# Patient Record
Sex: Female | Born: 1960 | Race: White | Hispanic: No | Marital: Married | Smoking: Current every day smoker
Health system: Southern US, Community
[De-identification: ages and names within clinical notes are randomized; demographics above are authoritative.]

## PROBLEM LIST (undated history)

## (undated) DIAGNOSIS — M1990 Unspecified osteoarthritis, unspecified site: Secondary | ICD-10-CM

## (undated) DIAGNOSIS — R1900 Intra-abdominal and pelvic swelling, mass and lump, unspecified site: Secondary | ICD-10-CM

## (undated) DIAGNOSIS — Z720 Tobacco use: Secondary | ICD-10-CM

## (undated) DIAGNOSIS — Z87442 Personal history of urinary calculi: Secondary | ICD-10-CM

## (undated) DIAGNOSIS — R3129 Other microscopic hematuria: Secondary | ICD-10-CM

## (undated) HISTORY — PX: LITHOTRIPSY: SUR834

## (undated) HISTORY — DX: Tobacco use: Z72.0

---

## 1991-03-02 HISTORY — PX: OTHER SURGICAL HISTORY: SHX169

## 2002-11-28 ENCOUNTER — Other Ambulatory Visit: Admission: RE | Admit: 2002-11-28 | Discharge: 2002-11-28 | Payer: Self-pay | Admitting: Obstetrics and Gynecology

## 2004-02-20 ENCOUNTER — Ambulatory Visit: Payer: Self-pay | Admitting: Specialist

## 2004-03-01 HISTORY — PX: BUNIONECTOMY: SHX129

## 2004-05-08 ENCOUNTER — Ambulatory Visit: Payer: Self-pay | Admitting: Specialist

## 2006-03-01 HISTORY — PX: SKIN SURGERY: SHX2413

## 2011-10-28 ENCOUNTER — Other Ambulatory Visit: Payer: Self-pay | Admitting: Urology

## 2011-10-29 ENCOUNTER — Encounter (HOSPITAL_COMMUNITY): Payer: Self-pay | Admitting: *Deleted

## 2011-10-29 NOTE — Pre-Procedure Instructions (Signed)
Asked to bring blue folder the day of the procedure,insurance card,I.D. driver's license,wear comfortable clothing and have a driver for the day. Asked not to take Advil,Motrin,Ibuprofen,Aleve or any NSAIDS, Aspirin, or Toradol for 72 hours prior to procedure,  No vitamins or herbal medications 7 days prior to procedure. Instructed to take laxative per doctor's office instructions and eat a light dinner the evening before procedure.   To arrive at        for procedure.

## 2011-11-29 ENCOUNTER — Encounter (HOSPITAL_COMMUNITY): Payer: Self-pay | Admitting: *Deleted

## 2011-11-29 NOTE — Pre-Procedure Instructions (Signed)
Asked to bring blue folder the day of the procedure,insurance card,I.D. driver's license,wear comfortable clothing and have a driver for the day. Asked not to take Advil,Motrin,Ibuprofen,Aleve or any NSAIDS, Aspirin, or Toradol for 72 hours prior to procedure,  No vitamins or herbal medications 7 days prior to procedure. Instructed to take laxative per doctor's office instructions and eat a light dinner the evening before procedure.   To arrive at 1030 for lithotripsy procedure.  

## 2011-12-02 ENCOUNTER — Encounter (HOSPITAL_COMMUNITY)
Admission: RE | Disposition: A | Discharge status: Home / Self Care | Payer: Self-pay | Source: Ambulatory Visit | Attending: Urology

## 2011-12-02 ENCOUNTER — Ambulatory Visit (HOSPITAL_COMMUNITY)
Admission: RE | Admit: 2011-12-02 | Discharge: 2011-12-02 | Disposition: A | Discharge status: Home / Self Care | Payer: BC Managed Care – PPO | Source: Ambulatory Visit | Attending: Urology | Admitting: Urology

## 2011-12-02 ENCOUNTER — Encounter (HOSPITAL_COMMUNITY): Payer: Self-pay | Admitting: *Deleted

## 2011-12-02 ENCOUNTER — Ambulatory Visit (HOSPITAL_COMMUNITY): Payer: BC Managed Care – PPO

## 2011-12-02 DIAGNOSIS — F17200 Nicotine dependence, unspecified, uncomplicated: Secondary | ICD-10-CM | POA: Insufficient documentation

## 2011-12-02 DIAGNOSIS — N200 Calculus of kidney: Secondary | ICD-10-CM | POA: Insufficient documentation

## 2011-12-02 SURGERY — LITHOTRIPSY, ESWL
Anesthesia: LOCAL | Laterality: Right

## 2011-12-02 MED ORDER — DIAZEPAM 5 MG PO TABS
10.0000 mg | ORAL_TABLET | ORAL | Status: AC
  Administered 2011-12-02: 10 mg via ORAL

## 2011-12-02 MED ORDER — CIPROFLOXACIN HCL 500 MG PO TABS
ORAL_TABLET | ORAL | Status: AC
  Administered 2011-12-02: 500 mg via ORAL
  Filled 2011-12-02: qty 1

## 2011-12-02 MED ORDER — DEXTROSE-NACL 5-0.45 % IV SOLN
INTRAVENOUS | Status: DC
  Administered 2011-12-02: 11:00:00 via INTRAVENOUS

## 2011-12-02 MED ORDER — DIPHENHYDRAMINE HCL 25 MG PO CAPS
25.0000 mg | ORAL_CAPSULE | ORAL | Status: AC
  Administered 2011-12-02: 25 mg via ORAL

## 2011-12-02 MED ORDER — DIAZEPAM 5 MG PO TABS
ORAL_TABLET | ORAL | Status: AC
  Administered 2011-12-02: 10 mg via ORAL
  Filled 2011-12-02: qty 2

## 2011-12-02 MED ORDER — CIPROFLOXACIN HCL 500 MG PO TABS
500.0000 mg | ORAL_TABLET | ORAL | Status: AC
  Administered 2011-12-02: 500 mg via ORAL

## 2011-12-02 MED ORDER — DIPHENHYDRAMINE HCL 25 MG PO CAPS
ORAL_CAPSULE | ORAL | Status: AC
  Administered 2011-12-02: 25 mg via ORAL
  Filled 2011-12-02: qty 1

## 2011-12-02 NOTE — H&P (Signed)
History and Physical  Chief Complaint:Right flank pain  History of Present Illness: Sharon Wright was seen for gross hematuria.  Work-up included a CT scan that showed a 10 x 6 mm right renal pelvis calculus with mild hydronephrosis.  She is scheduled for ESL.  Past Medical History  Diagnosis Date  . Chronic kidney disease     kidney stones   Past Surgical History  Procedure Date  . Back syugery 1993  . Benign tumor 1993    Medications: Ibuprofen Allergies:  Allergies  Allergen Reactions  . Penicillins     History reviewed. No pertinent family history. Social History:  reports that she has been smoking Cigarettes.  She has a 35 pack-year smoking history. She has never used smokeless tobacco. She reports that she does not drink alcohol or use illicit drugs.  ROS: All systems are reviewed and negative except as noted.   Physical Exam:  Vital signs in last 24 hours: Temp:  [97.4 F (36.3 C)] 97.4 F (36.3 C) (10/03 1035) Pulse Rate:  [76] 76  (10/03 1035) Resp:  [20] 20  (10/03 1035) BP: (129)/(65) 129/65 mmHg (10/03 1035) SpO2:  [100 %] 100 % (10/03 1035) Weight:  [218 lb 4 oz (98.998 kg)] 218 lb 4 oz (98.998 kg) (10/03 1035)  Cardiovascular: Skin warm; not flushed Respiratory: Breaths quiet; no shortness of breath Abdomen: No masses Neurological: Normal sensation to touch Musculoskeletal: Normal motor function arms and legs Lymphatics: No inguinal adenopathy Skin: No rashes Genitourinary:Normal female genitalia     Impression/Assessment:  Right renal pelvis calculus  Plan:  ESL right renal pelvis calculus.  Esperanza Madrazo-HENRY 12/02/2011, 12:42 PM

## 2011-12-02 NOTE — Progress Notes (Signed)
Pt spoke to RN about her daughter just getting out of rehab for prescription drug rehab. Pt does not want daughter to see Rx for narcotics or even knowing that pt has prescription drugs. Called Dr Brunilda Payor and informed him of this. Rx for vicodin to be escripted to [facility] in [place].

## 2011-12-02 NOTE — Op Note (Signed)
Refer to [facility] Op Note scanned in the chart 

## 2011-12-03 ENCOUNTER — Encounter (HOSPITAL_COMMUNITY): Payer: Self-pay

## 2013-01-31 ENCOUNTER — Encounter (HOSPITAL_COMMUNITY): Payer: Self-pay

## 2013-01-31 NOTE — Progress Notes (Signed)
Spoke with pt via phone in regards to lithotripsy procedure.  Reminded her to bring blue folder with paperwork completed.  Reminded pt to bring ID and insurance card.  Informed pt to stop aspirin/advil 72 hours before litho.  Informed pt about laxative before litho also.  No vitamins/herbal meds 7 days before procedure also.  Pt had litho over a year ago and states she is familiar with what to expect.  Informed her where to come also.  Pt stopped several vitamins and supplements about 10 days ago and she is aware to stay off of them until after the procedure.  Pt voiced understanding to teaching and information.

## 2013-02-01 ENCOUNTER — Other Ambulatory Visit: Payer: Self-pay | Admitting: Urology

## 2013-02-05 ENCOUNTER — Encounter (HOSPITAL_COMMUNITY)
Admission: RE | Disposition: A | Discharge status: Home / Self Care | Payer: Self-pay | Source: Ambulatory Visit | Attending: Urology

## 2013-02-05 ENCOUNTER — Encounter (HOSPITAL_COMMUNITY): Payer: Self-pay

## 2013-02-05 ENCOUNTER — Ambulatory Visit (HOSPITAL_COMMUNITY): Payer: BC Managed Care – PPO

## 2013-02-05 ENCOUNTER — Ambulatory Visit (HOSPITAL_COMMUNITY)
Admission: RE | Admit: 2013-02-05 | Discharge: 2013-02-05 | Disposition: A | Discharge status: Home / Self Care | Payer: BC Managed Care – PPO | Source: Ambulatory Visit | Attending: Urology | Admitting: Urology

## 2013-02-05 DIAGNOSIS — F17200 Nicotine dependence, unspecified, uncomplicated: Secondary | ICD-10-CM | POA: Insufficient documentation

## 2013-02-05 DIAGNOSIS — N200 Calculus of kidney: Secondary | ICD-10-CM | POA: Insufficient documentation

## 2013-02-05 SURGERY — LITHOTRIPSY, ESWL
Anesthesia: LOCAL | Laterality: Left

## 2013-02-05 MED ORDER — DIPHENHYDRAMINE HCL 25 MG PO CAPS
25.0000 mg | ORAL_CAPSULE | ORAL | Status: AC
  Administered 2013-02-05: 25 mg via ORAL
  Filled 2013-02-05: qty 1

## 2013-02-05 MED ORDER — DEXTROSE-NACL 5-0.45 % IV SOLN
INTRAVENOUS | Status: DC
  Administered 2013-02-05: 16:00:00 via INTRAVENOUS

## 2013-02-05 MED ORDER — DIAZEPAM 5 MG PO TABS
10.0000 mg | ORAL_TABLET | ORAL | Status: AC
  Administered 2013-02-05: 10 mg via ORAL
  Filled 2013-02-05: qty 2

## 2013-02-05 MED ORDER — CIPROFLOXACIN HCL 500 MG PO TABS
500.0000 mg | ORAL_TABLET | ORAL | Status: AC
  Administered 2013-02-05: 500 mg via ORAL
  Filled 2013-02-05: qty 1

## 2013-02-05 NOTE — Progress Notes (Signed)
Pt. C/o prescription that was dispensed in office, makes her nauseated, Dr. Brunilda Payor notified, instructed to reinforce teaching of eating something when taking medication, do not take on an empty stomach, teach back given to RN per patient and pt's husband, pt. To call office tomorrow, in nausea continues when taking medication.

## 2013-02-05 NOTE — Op Note (Signed)
Refer to [facility] Op Note scanned in the chart 

## 2013-02-05 NOTE — H&P (Signed)
History of Present Illness Sharon Wright is a patient of Dr. Madilyn Hook w/ GU hx of nephrolithiasis. She had a right ESWL on 11/2011 for a 6x61mm right renal pelvis stone and f/u KUB in Jan 2014 showed no residual stones.    Interval hx:  Sharon Wright returns today w/ c/o 1 wk hx of left flank pain that started after taking a long walk, 10/10 that fluctuates w/ 5/10 in pain severity. She was seen at local ER and found to have a non-obstructing 9mm left renal pelvis stone.  nothing makes the pain better or worse. ibuprofen provides minimal relief.  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 no fever/chills/nause/vomitting but c/o bad headache and nausea. Currently pain is described as soreness. She has chronic back pain so is uncertain is pain is worsened by back pain or stone. She does not like to take pain meds because she has high tolerance for pain and worried of tolerance. She does c/o constipation.  Denies fever/chills/vomiting.  Denies any voiding symptoms. Denies hematuria or associated signs/symptoms or other modifying factors.   Past Medical History Problems  1. History of Abdominal pain, suprapubic (789.09) 2. 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 History of No Medical Problems  Surgical History Problems  1. History of Back Surgery 2. 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 History of Lithotripsy  Current Meds 1. Ibuprofen TABS;  Therapy: (Recorded:12Jul2013) to Recorded 2. Vitamin B Complex Oral Tablet;  Therapy: (Recorded:24Nov2014) to Recorded 3. Vitamin D3 CAPS;  Therapy: (Recorded:24Nov2014) to Recorded  Allergies Medication  1. Penicillins  Family History Problems  1. Family history of Family Health Status - Mother's Age : Mother   64 yo 2. Family history of Heart Disease (V17.49) : Mother 3. Family history of Heart Disease (V17.49) 4. Family history of Hematuria : Mother 5.  Family history of Hypertension (V17.49) : Mother 6. Family history of Hypertension (V17.49) 7. Family history of Type 2 Diabetes Mellitus 8. Family history of Urinary Calculus : Father 41. Family history of Urinary Calculus 10. Family history of Uterine Cancer (V16.49) : Mother  Social History Problems  1. Denied: History of Alcohol Use 2. Caffeine Use   3/day 3. Marital History - Currently Married 4. Occupation:   Designer, fashion/clothing 5. Smoker, current status unknown (305.1) 6. Tobacco Use (V15.82)   Smokes 1 pack for >30 yrs  Review of Systems Genitourinary, constitutional, skin, eye, otolaryngeal, hematologic/lymphatic, cardiovascular, pulmonary, endocrine, musculoskeletal, gastrointestinal, neurological and psychiatric system(s) were reviewed and pertinent findings if present are noted.  Genitourinary: no urinary frequency, no urinary urgency and no hematuria.  Gastrointestinal: nausea and flank pain, but no vomiting, no abdominal pain, no diarrhea and no constipation.  Constitutional: no fever.    Vitals Vital Signs [Data Includes: Last 1 Day]  Recorded: 24Nov2014 02:43PM  Blood Pressure: 113 / 77 Temperature: 98 F Heart Rate: 65  Physical Exam Constitutional: Well nourished and well developed . No acute distress. HEENT: Within normal limits Lungs: clear  Cardiovascular:. The arterial pulses are normal.  Abdomen: The abdomen is soft and nontender. No right CVA tenderness and left CVA tenderness. Genitalia: Normal female genitalia Extremities: Within normal limits Skin: Warm and dry.  No rash.    Results/Data Urine [Data Includes: Last 1 Day]   24Nov2014 COLOR YELLOW  APPEARANCE CLEAR  SPECIFIC GRAVITY 1.030  pH 6.0  GLUCOSE NEG mg/dL BILIRUBIN NEG  KETONE TRACE  mg/dL BLOOD TRACE  PROTEIN NEG mg/dL UROBILINOGEN 0.2 mg/dL NITRITE NEG  LEUKOCYTE ESTERASE NEG  SQUAMOUS EPITHELIAL/HPF FEW  WBC 0-2 WBC/hpf RBC 3-6 RBC/hpf BACTERIA RARE  CRYSTALS Calcium Oxalate  crystals noted  CASTS NONE SEEN   The following images/tracing/specimen were independently visualized:  Renal US of left kidney: 11x5x1.9x6.13 cm w/ 7.26mm stone in renal pelvis and simple cyst of 1.58x0.94x1.46 cm., no hydronephrosis. KUB + 7-8 mm left renal pelvis stone.  The following clinical lab reports were reviewed:  UA + 3-6 RBC but no WBC or bacteria.    Assessment Assessed  1. Nephrolithiasis (592.0) 2. Left flank pain (789.09) 3. Constipation (564.00)  Pt is symptomatic for left flank pain w/ KUB + 7-1mm left renal pelvis stone and UA + 3-6 RBC but no hydronephrosis   Plan  Constipation  1. Start: Docusate Sodium 100 MG Oral Capsule; TAKE 1 CAPSULE TWICE DAILY AS  NEEDED Health Maintenance  2. UA With REFLEX; Status:Complete;   Done: 24Nov2014 02:33PM Left flank pain, Nephrolithiasis  3. Start: Xartemis XR 7.5-325 MG Oral Tablet Extended Release; Take 1 tablet twice daily 4. Follow-up Office  Follow-upneeds to be scheduled for ESWL  Status: Hold For -  Appointment,Date of Service  Requested for: 24Nov2014  Discussion/Summary  - 1  Discussed left ESWL for stone retrieval since pt is symptomatic for  pain.  pain and stone will not be able to pass w/ MET due to size and location.. 1  Patient is familiar w/ procedure and would like to proceed. Surgery order for ESWL given to scheduler. Dr. Brunilda Payor was  informed. informed.    - Xarmetis oxycodone Rx given to patient for pain since patient aware that she should not be taking any blood thinners : ASA or ibuprofen 7 days prior to ESWL. Colace also given to prevent worsening of constipation while on pain med.1

## 2013-02-09 ENCOUNTER — Other Ambulatory Visit: Payer: Self-pay | Admitting: Urology

## 2013-02-09 SURGERY — Surgical Case
Anesthesia: *Unknown

## 2015-06-19 ENCOUNTER — Emergency Department (HOSPITAL_COMMUNITY)
Admission: EM | Admit: 2015-06-19 | Discharge: 2015-06-20 | Disposition: A | Discharge status: Home / Self Care | Payer: PRIVATE HEALTH INSURANCE | Attending: Emergency Medicine | Admitting: Emergency Medicine

## 2015-06-19 ENCOUNTER — Emergency Department (HOSPITAL_COMMUNITY): Payer: PRIVATE HEALTH INSURANCE

## 2015-06-19 ENCOUNTER — Encounter (HOSPITAL_COMMUNITY): Payer: Self-pay

## 2015-06-19 DIAGNOSIS — R079 Chest pain, unspecified: Secondary | ICD-10-CM

## 2015-06-19 DIAGNOSIS — R0602 Shortness of breath: Secondary | ICD-10-CM | POA: Insufficient documentation

## 2015-06-19 DIAGNOSIS — N189 Chronic kidney disease, unspecified: Secondary | ICD-10-CM | POA: Insufficient documentation

## 2015-06-19 DIAGNOSIS — R61 Generalized hyperhidrosis: Secondary | ICD-10-CM | POA: Insufficient documentation

## 2015-06-19 DIAGNOSIS — Z880 Allergy status to penicillin: Secondary | ICD-10-CM | POA: Insufficient documentation

## 2015-06-19 DIAGNOSIS — M542 Cervicalgia: Secondary | ICD-10-CM | POA: Insufficient documentation

## 2015-06-19 DIAGNOSIS — Z8719 Personal history of other diseases of the digestive system: Secondary | ICD-10-CM | POA: Insufficient documentation

## 2015-06-19 DIAGNOSIS — F17210 Nicotine dependence, cigarettes, uncomplicated: Secondary | ICD-10-CM | POA: Diagnosis not present

## 2015-06-19 LAB — BASIC METABOLIC PANEL
ANION GAP: 9 (ref 5–15)
BUN: 21 mg/dL — ABNORMAL HIGH (ref 6–20)
CO2: 26 mmol/L (ref 22–32)
Calcium: 10 mg/dL (ref 8.9–10.3)
Chloride: 106 mmol/L (ref 101–111)
Creatinine, Ser: 0.68 mg/dL (ref 0.44–1.00)
GFR calc Af Amer: >60 mL/min (ref >60)
GLUCOSE: 99 mg/dL (ref 65–99)
POTASSIUM: 3.9 mmol/L (ref 3.5–5.1)
Sodium: 141 mmol/L (ref 135–145)

## 2015-06-19 LAB — CBC
HEMATOCRIT: 44.4 % (ref 36.0–46.0)
HEMOGLOBIN: 15.3 g/dL — AB (ref 12.0–15.0)
MCH: 30.5 pg (ref 26.0–34.0)
MCHC: 34.5 g/dL (ref 30.0–36.0)
MCV: 88.6 fL (ref 78.0–100.0)
Platelets: 231 10*3/uL (ref 150–400)
RBC: 5.01 MIL/uL (ref 3.87–5.11)
RDW: 13.7 % (ref 11.5–15.5)
WBC: 9.3 10*3/uL (ref 4.0–10.5)

## 2015-06-19 LAB — I-STAT TROPONIN, ED: Troponin i, poc: 0 ng/mL (ref 0.00–0.08)

## 2015-06-19 MED ORDER — IOPAMIDOL (ISOVUE-370) INJECTION 76%
INTRAVENOUS | Status: AC
  Administered 2015-06-19: 100 mL
  Filled 2015-06-19: qty 100

## 2015-06-19 NOTE — ED Provider Notes (Signed)
CSN: [id number]     Arrival date & time 06/19/15  1809 History   First MD Initiated Contact with Patient 06/19/15 2056     Chief Complaint  Patient presents with  . Chest Pain     HPI   Sharon Wright is an 55 y.o. female with history of gallstones and CKD who presents to the ED for evaluation of chest pain. She states she has had sharp, centralized, severe chest pain for the past four to five days. She states the episodes last for 5-10 minutes and resolve on their own. She states that the pain is severe and debilitating with associated difficulty breathing during the episodes due to the degree of pain. She reports associated diaphoresis. She has not tried anything to alleviate the episodes. The last episode was earlier today. They are random and and not associated with rest or exertion. She states that today's episode of pain radiated up her neck bilaterally. She states the pain was a burning pain in her neck. Denies feeling faint or lightheaded. Denies calf pain or LE edema. She states nothing like this has ever happened before. She does not have a personal history of cardiac dz but endorses significant fam hx of cardiac issues. Admits to smoking 1/2 - 1 PPD for decades.  Past Medical History  Diagnosis Date  . Chronic kidney disease     kidney stones   Past Surgical History  Procedure Laterality Date  . Back syugery  1993  . Benign tumor  1993   No family history on file. Social History  Substance Use Topics  . Smoking status: Current Every Day Smoker -- 0.50 packs/day for 35 years    Types: Cigarettes  . Smokeless tobacco: Never Used  . Alcohol Use: No   OB History    No data available     Review of Systems  All other systems reviewed and are negative.     Allergies  Oxycodone and Penicillins  Home Medications   Prior to Admission medications   Medication Sig Start Date End Date Taking? Authorizing Provider  acetaminophen (TYLENOL) 500 MG tablet Take 500 mg by mouth  every 6 (six) hours as needed for moderate pain.   Yes Historical Provider, MD  cholecalciferol (VITAMIN D) 1000 UNITS tablet Take 1,000 Units by mouth daily. Pt stopped med last week   Yes Historical Provider, MD  ibuprofen (ADVIL,MOTRIN) 200 MG tablet Take 200 mg by mouth every 6 (six) hours as needed. For headaches. Works 3rd shift   Yes Historical Provider, MD   BP 129/72 mmHg  Pulse 84  Temp(Src) 97.9 F (36.6 C) (Oral)  Resp 16  SpO2 94% Physical Exam  Constitutional: She is oriented to person, place, and time.  HENT:  Right Ear: External ear normal.  Left Ear: External ear normal.  Nose: Nose normal.  Mouth/Throat: Oropharynx is clear and moist. No oropharyngeal exudate.  Eyes: Conjunctivae and EOM are normal. Pupils are equal, round, and reactive to light.  Neck: Normal range of motion. Neck supple.  Cardiovascular: Normal rate, regular rhythm, normal heart sounds and intact distal pulses.   Pulmonary/Chest: Effort normal and breath sounds normal. No respiratory distress. She has no wheezes. She exhibits no tenderness.  Abdominal: Soft. Bowel sounds are normal. She exhibits no distension. There is no tenderness. There is no rebound and no guarding.  Musculoskeletal: She exhibits no edema.  Neurological: She is alert and oriented to person, place, and time. No cranial nerve deficit.  Skin: Skin is  warm and dry.  Psychiatric: She has a normal mood and affect.  Nursing note and vitals reviewed.   ED Course  Procedures (including critical care time) Labs Review Labs Reviewed  BASIC METABOLIC PANEL - Abnormal; Notable for the following:    BUN 21 (*)    All other components within normal limits  CBC - Abnormal; Notable for the following:    Hemoglobin 15.3 (*)    All other components within normal limits  I-STAT TROPOININ, ED    Imaging Review Dg Chest 2 View  06/19/2015  CLINICAL DATA:  Sharp chest pain for several days radiating into the neck EXAM: CHEST  2 VIEW  COMPARISON:  None. FINDINGS: The heart size and mediastinal contours are within normal limits. Both lungs are clear. The visualized skeletal structures are unremarkable. IMPRESSION: No active cardiopulmonary disease. Electronically Signed   By: Inez Catalina M.D.   On: 06/19/2015 18:40   Ct Angio Chest Aorta W/cm &/or Wo/cm  06/19/2015  CLINICAL DATA:  Chest pain over the last few days, worsening. Shortness of Breath. EXAM: CT ANGIOGRAPHY CHEST WITH CONTRAST TECHNIQUE: Multidetector CT imaging of the chest was performed using the standard protocol during bolus administration of intravenous contrast. Multiplanar CT image reconstructions and MIPs were obtained to evaluate the vascular anatomy. CONTRAST:  100 cc Isovue 370 COMPARISON:  06/19/2015 FINDINGS: The patient was scanned with her arms by her sides, reducing diagnostic sensitivity due to streak artifact. Mediastinum/Nodes: No filling defect is identified in the pulmonary arterial tree to suggest pulmonary embolus. There is adequate systemic arterial contrast to assess the thoracic aorta, and no aortic or branch vessel dissection is identified. No mediastinal hematoma. Lungs/Pleura: Is 3 mm likely calcified nodule in the right upper lobe, image 28/603. Calcified anterior right upper lobe nodule on image 46/603 compatible with granuloma. The left lung appears clear. Upper abdomen: Several small hypodense lesions are present in the lateral segment left hepatic lobe, probably cysts but technically nonspecific. The patient did have some prior small cysts in the liver on prior imaging in 2012, although not in the exact location seen today. Musculoskeletal: Unremarkable Review of the MIP images confirms the above findings. IMPRESSION: 1. No findings of pulmonary embolus or aortic dissection. 2. 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 3. Several small hypodense lesions in the lateral segment left hepatic lobe, likely cysts although technically not entirely specific.  Electronically Signed   By: Van Clines M.D.   On: 06/19/2015 23:45   I have personally reviewed and evaluated these images and lab results as part of my medical decision-making.   EKG Interpretation None       EKG not crossing over but nonacute  MDM   Final diagnoses:  Chest pain, unspecified chest pain type    Pt presenting with several days of intermittent chest pain with associated SOB, diaphoresis, and most recently with radiation to her neck. Trop is negative. EKG not crossing over but nonacute. Labs at baseline. CXR negative. CT angio chest was obtained to r/o dissection and was negative for dissection or PE. She is hemodynamically stable with no hypoxia, tachypnea, or focal exam findings. Discussed with pt there are many etiologies to chest pain including stress/anxiety, msk pain, and even GERD. Encouraged to f/u with PCP for further evaluation and management of symptoms. ER return precautions given.    Anne Ng, PA-C 06/20/15 [employer], MD 06/20/15 [phone]

## 2015-06-19 NOTE — ED Notes (Signed)
Pt reports central sharp chest pain, onset Saturday but the pain has worsened today and radiates to her neck. Pt reports she has been sweating a lot since the pain started and SOB on Saturday but not today.

## 2015-06-20 NOTE — Discharge Instructions (Signed)
You were seen in the emergency room today for evaluation of chest pain. Your labs including chest x-ray and CT scan were unremarkable. Your pain could be due to stress/anxiety or even heart burn or musculoskeletal pain. Return to the emergency room for worsening condition or new concerning symptoms. Follow up with your regular doctor. If you don't have a regular doctor use one of the numbers below to establish a primary care doctor.   Emergency Department Resource Guide 1) Find a Doctor and Pay Out of Pocket Although you won't have to find out who is covered by your insurance plan, it is a good idea to ask around and get recommendations. You will then need to call the office and see if the doctor you have chosen will accept you as a new patient and what types of options they offer for patients who are self-pay. Some doctors offer discounts or will set up payment plans for their patients who do not have insurance, but you will need to ask so you aren't surprised when you get to your appointment.  2) Contact Your Local Health Department Not all health departments have doctors that can see patients for sick visits, but many do, so it is worth a call to see if yours does. If you don't know where your local health department is, you can check in your phone book. The CDC also has a tool to help you locate your state's health department, and many state websites also have listings of all of their local health departments.  3) Find a [facility] If your illness is not likely to be very severe or complicated, you may want to try a walk in clinic. These are popping up all over the country in pharmacies, drugstores, and shopping centers. They're usually staffed by nurse practitioners or physician assistants that have been trained to treat common illnesses and complaints. They're usually fairly quick and inexpensive. However, if you have serious medical issues or chronic medical problems, these are probably not  your best option.  No Primary Care Doctor: - Call Health Connect at  [phone] - they can help you locate a primary care doctor that  accepts your insurance, provides certain services, etc. - Physician Referral [phone]  Emergency Department Resource Guide 1) Find a Doctor and Pay Out of Pocket Although you won't have to find out who is covered by your insurance plan, it is a good idea to ask around and get recommendations. You will then need to call the office and see if the doctor you have chosen will accept you as a new patient and what types of options they offer for patients who are self-pay. Some doctors offer discounts or will set up payment plans for their patients who do not have insurance, but you will need to ask so you aren't surprised when you get to your appointment.  2) Contact Your Local Health Department Not all health departments have doctors that can see patients for sick visits, but many do, so it is worth a call to see if yours does. If you don't know where your local health department is, you can check in your phone book. The CDC also has a tool to help you locate your state's health department, and many state websites also have listings of all of their local health departments.  3) Find a [facility] If your illness is not likely to be very severe or complicated, you may want to try a walk in clinic. These are popping  up all over the country in pharmacies, drugstores, and shopping centers. They're usually staffed by nurse practitioners or physician assistants that have been trained to treat common illnesses and complaints. They're usually fairly quick and inexpensive. However, if you have serious medical issues or chronic medical problems, these are probably not your best option.  No Primary Care Doctor: - Call Health Connect at  [phone] - they can help you locate a primary care doctor that  accepts your insurance, provides certain services, etc. - Physician  Referral Service- [phone]  Chronic Pain Problems: Organization         Address  Phone   Notes  [facility]  [phone] Patients need to be referred by their primary care doctor.   Medication Assistance: Organization         Address  Phone   Notes  [facility] Medication [facility] [address] [place], [place] [address] [phone] --Must be a resident of [facility] -- Must have NO insurance coverage whatsoever (no Medicaid/ Medicare, etc.) -- The pt. MUST have a primary care doctor that directs their care regularly and follows them in the community   [facility]  [phone]   [employer]  [phone]    Agencies that provide inexpensive medical care: Organization         Address  Phone   Notes  [place]  [phone]   [facility]    [phone]   [facility] [place], [place] [place] [address] [phone]   [facility] [address] [place] [phone]   [facility]anned Parenthood    [phone]   [facility]    [phone]   [place] [address], [place] Phone:  [phone], Fax:  [phone] Hours of Operation:  9 am - 6 pm, M-F.  Also accepts Medicaid/Medicare and self-pay.  [facility] for [place] [address], [address], [place] Phone: [phone], Fax: [phone]. Hours of Operation:  8:30 am - 5:30 pm, M-F.  Also accepts Medicaid and self-pay.  [facility] [place] [address] [place] Phone: [phone]   [place], [place], [place] [phone], [address]. [phone] Mondays & Thursdays: 7-9 AM.  First 15 patients are seen on a first come, first serve basis.    [place] Providers:  Organization         Address  Phone   Notes  [facility] [address],  [phone] Also accepts self-pay patients.  [facility] [id number] [place], [place]  [phone]   [place], [address], [place] [phone]   [facility] [address], [place] [phone]   Lucianne Lei [address], [place]   [phone] Only accepts [place] [facility] [place] patients after they have their name applied to their card.   Self-Pay (no insurance) in [facility]:  Organization         Address  Phone   Notes  Sickle Cell Patients, [facility] [place] [phone]   [facility] [place] [phone]   [facility] [place]  [phone]  [address] [address], [place] [phone]   [facility]/Dr. Osei-Bonsu  [address] [place] or [address], [place] [phone] Phone number for both [place] and [place] locations is the same.  [facility] [address] [place] [phone]   [facility] [address], [place] or [address] [phone]   [facility] [address] [place] [phone], phone; [phone], fax Sees patients 1st and 3rd Saturday of every month.  Must not qualify for public or private insurance (i.e. Medicaid, Medicare, [place] Health Choice, Veterans' Benefits)  Household income should be no more than 200% of the poverty level The clinic cannot treat you if you are pregnant or think you are pregnant  Sexually transmitted diseases are not treated at the clinic.

## 2015-06-20 NOTE — ED Notes (Signed)
Pt verbalized understanding of d/c instructions and has no further questions. Pt stable and NAD.  

## 2016-08-02 DIAGNOSIS — R1032 Left lower quadrant pain: Secondary | ICD-10-CM | POA: Diagnosis not present

## 2016-08-02 DIAGNOSIS — K5900 Constipation, unspecified: Secondary | ICD-10-CM | POA: Diagnosis not present

## 2016-08-02 DIAGNOSIS — Z1389 Encounter for screening for other disorder: Secondary | ICD-10-CM | POA: Diagnosis not present

## 2016-08-02 DIAGNOSIS — C679 Malignant neoplasm of bladder, unspecified: Secondary | ICD-10-CM | POA: Diagnosis not present

## 2016-10-20 DIAGNOSIS — L710 Perioral dermatitis: Secondary | ICD-10-CM | POA: Diagnosis not present

## 2016-10-20 DIAGNOSIS — Z6829 Body mass index (BMI) 29.0-29.9, adult: Secondary | ICD-10-CM | POA: Diagnosis not present

## 2016-10-20 DIAGNOSIS — M7061 Trochanteric bursitis, right hip: Secondary | ICD-10-CM | POA: Diagnosis not present

## 2016-12-21 DIAGNOSIS — Z6830 Body mass index (BMI) 30.0-30.9, adult: Secondary | ICD-10-CM | POA: Diagnosis not present

## 2016-12-21 DIAGNOSIS — M25551 Pain in right hip: Secondary | ICD-10-CM | POA: Diagnosis not present

## 2016-12-21 DIAGNOSIS — M545 Low back pain: Secondary | ICD-10-CM | POA: Diagnosis not present

## 2017-02-10 DIAGNOSIS — N3091 Cystitis, unspecified with hematuria: Secondary | ICD-10-CM | POA: Diagnosis not present

## 2017-02-10 DIAGNOSIS — Z6831 Body mass index (BMI) 31.0-31.9, adult: Secondary | ICD-10-CM | POA: Diagnosis not present

## 2017-02-16 DIAGNOSIS — R112 Nausea with vomiting, unspecified: Secondary | ICD-10-CM | POA: Diagnosis not present

## 2017-02-16 DIAGNOSIS — Z6830 Body mass index (BMI) 30.0-30.9, adult: Secondary | ICD-10-CM | POA: Diagnosis not present

## 2017-02-16 DIAGNOSIS — N390 Urinary tract infection, site not specified: Secondary | ICD-10-CM | POA: Diagnosis not present

## 2017-03-09 DIAGNOSIS — Z6830 Body mass index (BMI) 30.0-30.9, adult: Secondary | ICD-10-CM | POA: Diagnosis not present

## 2017-03-09 DIAGNOSIS — R079 Chest pain, unspecified: Secondary | ICD-10-CM | POA: Diagnosis not present

## 2017-03-09 DIAGNOSIS — M545 Low back pain: Secondary | ICD-10-CM | POA: Diagnosis not present

## 2017-03-22 ENCOUNTER — Encounter: Payer: Self-pay | Admitting: Family Medicine

## 2017-03-22 ENCOUNTER — Ambulatory Visit: Payer: BLUE CROSS/BLUE SHIELD | Admitting: Family Medicine

## 2017-03-22 VITALS — BP 124/78 | HR 90 | Temp 97.6°F | Wt 208.7 lb

## 2017-03-22 DIAGNOSIS — H68103 Unspecified obstruction of Eustachian tube, bilateral: Secondary | ICD-10-CM | POA: Diagnosis not present

## 2017-03-22 DIAGNOSIS — Z720 Tobacco use: Secondary | ICD-10-CM | POA: Insufficient documentation

## 2017-03-22 DIAGNOSIS — H9201 Otalgia, right ear: Secondary | ICD-10-CM

## 2017-03-22 HISTORY — DX: Tobacco use: Z72.0

## 2017-03-22 MED ORDER — PREDNISONE 20 MG PO TABS: 20.0000 mg | ORAL_TABLET | Freq: Every day | ORAL | 0 refills | Status: AC

## 2017-03-22 NOTE — Assessment & Plan Note (Signed)
Reviewed briefly the AVS info and told her that I am here to help if/when she is ready to quit

## 2017-03-22 NOTE — Patient Instructions (Addendum)
I do encourage you to quit smoking Call [phone] to sign up for smoking cessation classes You can call 1-800-QUIT-NOW to talk with a smoking cessation coach  Please do eat yogurt or kimchi or take a probiotic daily for the next month We want to replace the healthy germs in the gut If you notice foul, watery diarrhea in the next two months, schedule an appointment RIGHT AWAY or go to an urgent care or the emergency room if a holiday or over a weekend  Return for a physical in the next month or two at your convenience Call me if symptoms aren't gone after your prednisone  Health Risks of Smoking Smoking cigarettes is very bad for your health. Tobacco smoke has over 200 known poisons in it. It contains the poisonous gases nitrogen oxide and carbon monoxide. There are over 60 chemicals in tobacco smoke that cause cancer. Smoking is difficult to quit because a chemical in tobacco, called nicotine, causes addiction or dependence. When you smoke and inhale, nicotine is absorbed rapidly into the bloodstream through your lungs. Both inhaled and non-inhaled nicotine may be addictive. What are the risks of cigarette smoke? Cigarette smokers have an increased risk of many serious medical problems, including:  Lung cancer.  Lung disease, such as pneumonia, bronchitis, and emphysema.  Chest pain (angina) and heart attack because the heart is not getting enough oxygen.  Heart disease and peripheral blood vessel disease.  High blood pressure (hypertension).  Stroke.  Oral cancer, including cancer of the lip, mouth, or voice box.  Bladder cancer.  Pancreatic cancer.  Cervical cancer.  Pregnancy complications, including premature birth.  Stillbirths and smaller newborn babies, birth defects, and genetic damage to sperm.  Early menopause.  Lower estrogen level for women.  Infertility.  Facial wrinkles.  Blindness.  Increased risk of broken bones (fractures).  Senile  dementia.  Stomach ulcers and internal bleeding.  Delayed wound healing and increased risk of complications during surgery.  Even smoking lightly shortens your life expectancy by several years.  Because of secondhand smoke exposure, children of smokers have an increased risk of the following:  Sudden infant death syndrome (SIDS).  Respiratory infections.  Lung cancer.  Heart disease.  Ear infections.  What are the benefits of quitting? There are many health benefits of quitting smoking. Here are some of them:  Within days of quitting smoking, your risk of having a heart attack decreases, your blood flow improves, and your lung capacity improves. Blood pressure, pulse rate, and breathing patterns start returning to normal soon after quitting.  Within months, your lungs may clear up completely.  Quitting for 10 years reduces your risk of developing lung cancer and heart disease to almost that of a nonsmoker.  People who quit may see an improvement in their overall quality of life.  How do I quit smoking? Smoking is an addiction with both physical and psychological effects, and longtime habits can be hard to change. Your health care provider can recommend:  Programs and community resources, which may include group support, education, or talk therapy.  Prescription medicines to help reduce cravings.  Nicotine replacement products, such as patches, gum, and nasal sprays. Use these products only as directed. Do not replace cigarette smoking with electronic cigarettes, which are commonly called e-cigarettes. The safety of e-cigarettes is not known, and some may contain harmful chemicals.  A combination of two or more of these methods.  Where to find more information:  American Lung Association: www.lung.org  American Cancer  Society: www.cancer.org Summary  Smoking cigarettes is very bad for your health. Cigarette smokers have an increased risk of many serious medical  problems, including several cancers, heart disease, and stroke.  Smoking is an addiction with both physical and psychological effects, and longtime habits can be hard to change.  By stopping right away, you can greatly reduce the risk of medical problems for you and your family.  To help you quit smoking, your health care provider can recommend programs, community resources, prescription medicines, and nicotine replacement products such as patches, gum, and nasal sprays. This information is not intended to replace advice given to you by your health care provider. Make sure you discuss any questions you have with your health care provider. Document Released: 03/25/2004 Document Revised: 02/20/2016 Document Reviewed: 02/20/2016 Elsevier Interactive Patient Education  2017 [employer].  Steps to Quit Smoking Smoking tobacco can be bad for your health. It can also affect almost every organ in your body. Smoking puts you and people around you at risk for many serious long-lasting (chronic) diseases. Quitting smoking is hard, but it is one of the best things that you can do for your health. It is never too late to quit. What are the benefits of quitting smoking? When you quit smoking, you lower your risk for getting serious diseases and conditions. They can include:  Lung cancer or lung disease.  Heart disease.  Stroke.  Heart attack.  Not being able to have children (infertility).  Weak bones (osteoporosis) and broken bones (fractures).  If you have coughing, wheezing, and shortness of breath, those symptoms may get better when you quit. You may also get sick less often. If you are pregnant, quitting smoking can help to lower your chances of having a baby of low birth weight. What can I do to help me quit smoking? Talk with your doctor about what can help you quit smoking. Some things you can do (strategies) include:  Quitting smoking totally, instead of slowly cutting back how much you  smoke over a period of time.  Going to in-person counseling. You are more likely to quit if you go to many counseling sessions.  Using resources and support systems, such as: ? Database administrator with a Social worker. ? Phone quitlines. ? Careers information officer. ? Support groups or group counseling. ? Text messaging programs. ? Mobile phone apps or applications.  Taking medicines. Some of these medicines may have nicotine in them. If you are pregnant or breastfeeding, do not take any medicines to quit smoking unless your doctor says it is okay. Talk with your doctor about counseling or other things that can help you.  Talk with your doctor about using more than one strategy at the same time, such as taking medicines while you are also going to in-person counseling. This can help make quitting easier. What things can I do to make it easier to quit? Quitting smoking might feel very hard at first, but there is a lot that you can do to make it easier. Take these steps:  Talk to your family and friends. Ask them to support and encourage you.  Call phone quitlines, reach out to support groups, or work with a Social worker.  Ask people who smoke to not smoke around you.  Avoid places that make you want (trigger) to smoke, such as: ? Bars. ? Parties. ? Smoke-break areas at work.  Spend time with people who do not smoke.  Lower the stress in your life. Stress can make you  want to smoke. Try these things to help your stress: ? Getting regular exercise. ? Deep-breathing exercises. ? Yoga. ? Meditating. ? Doing a body scan. To do this, close your eyes, focus on one area of your body at a time from head to toe, and notice which parts of your body are tense. Try to relax the muscles in those areas.  Download or buy apps on your mobile phone or tablet that can help you stick to your quit plan. There are many free apps, such as QuitGuide from the [employer] Office manager for Disease Control and Prevention). You can  find more support from smokefree.gov and other websites.  This information is not intended to replace advice given to you by your health care provider. Make sure you discuss any questions you have with your health care provider. Document Released: 12/12/2008 Document Revised: 10/14/2015 Document Reviewed: 07/02/2014 Elsevier Interactive Patient Education  2018 [employer].

## 2017-03-22 NOTE — Progress Notes (Signed)
BP 124/78 (BP Location: Left Arm, Patient Position: Sitting, Cuff Size: Large)   Pulse 90   Temp 97.6 F (36.4 C) (Oral)   Wt 208 lb 11.2 oz (94.7 kg)   SpO2 98%   BMI 31.73 kg/m    Subjective:    Patient ID: Sharon Wright, female    DOB: 1960/08/27, 57 y.o.   MRN: [id number]  HPI: Sharon Wright is a 57 y.o. female  Chief Complaint  Patient presents with  . Ear Pain    worse at night, RT worse than left,   . Sinusitis    HPI Patient is establishing care here today; I care for her husband She has been having hearing loss, worse at night; right worse than left; goes to spend the night with her mother; couldn't even hear her mother's oxygen alarm with the right ear; thought it was her phone alarm Symptoms first started with decreased hearing Congestion for a week; can breathe pretty good now; really sore still under the angle of the right jaw; coughing and diaphragm is is still sore;  Did a teledoc thing, doxy plus nasal corticosteroid; no real help; ear throbs like a toothache; no ear drops Used OTC nyquil and dayquil; just one nyquil at night  She'll return for a physical in the next month or two She says she gets labs through her workplace (or perhaps it was her husband's workplace), and she says her numbers are pretty decent, except they told her to lose weight  Depression screen PHQ 2/9 03/22/2017  Decreased Interest 0  Down, Depressed, Hopeless 0  PHQ - 2 Score 0   Relevant past medical, surgical, family and social history reviewed Past Medical History:  Diagnosis Date  . Chronic kidney disease    kidney stones  . Tobacco abuse 03/22/2017   Past Surgical History:  Procedure Laterality Date  . back syugery  1993  . Benign tumor  1993   Family History  Problem Relation Age of Onset  . Congestive Heart Failure Mother   . Heart disease Mother   . Diabetes Father   . Heart disease Father   . Dementia Father   . Heart disease Sister    Social History     Tobacco Use  . Smoking status: Current Every Day Smoker    Packs/day: 1.00    Years: 35.00    Pack years: 35.00    Types: Cigarettes  . Smokeless tobacco: Never Used  Substance Use Topics  . Alcohol use: No  . Drug use: No   Interim medical history since last visit reviewed. Allergies and medications reviewed  Review of Systems Per HPI unless specifically indicated above     Objective:    BP 124/78 (BP Location: Left Arm, Patient Position: Sitting, Cuff Size: Large)   Pulse 90   Temp 97.6 F (36.4 C) (Oral)   Wt 208 lb 11.2 oz (94.7 kg)   SpO2 98%   BMI 31.73 kg/m   Wt Readings from Last 3 Encounters:  03/22/17 208 lb 11.2 oz (94.7 kg)  02/05/13 183 lb 8 oz (83.2 kg)  12/02/11 218 lb 4 oz (99 kg)    Physical Exam  Constitutional: She appears well-developed and well-nourished.  HENT:  Right Ear: No drainage or swelling. Tympanic membrane is not injected, not scarred, not perforated, not erythematous and not retracted. A middle ear effusion (scant, cloudy behind drum) is present.  Left Ear: No drainage or swelling. Tympanic membrane is not injected, not  scarred, not perforated, not erythematous and not retracted. A middle ear effusion (scant, cloudy behind drum) is present.  Nose: Mucosal edema and rhinorrhea present.  Mouth/Throat: Oropharynx is clear and moist and mucous membranes are normal.  Eyes: EOM are normal. No scleral icterus.  Cardiovascular: Normal rate and regular rhythm.  Pulmonary/Chest: Effort normal and breath sounds normal.  Lymphadenopathy:    She has no cervical adenopathy.  Skin: She is not diaphoretic. No pallor.  Psychiatric: She has a normal mood and affect. Her behavior is normal.      Assessment & Plan:   Problem List Items Addressed This Visit      Other   Tobacco abuse    Reviewed briefly the AVS info and told her that I am here to help if/when she is ready to quit       Other Visit Diagnoses    Acute ear pain, right    -   Primary   perhaps resolving infection, referred pain, ETD; continue ABX, start prednisone; if not better at end of prednisone, let me know, refer to ENT; no NSAIDs w/pred   Obstruction of both eustachian tubes       will treat w/prednisone x 5 days; NO other NSAIDs while on this, could cause ulcer; she agrees; let me know if not better after 5 days, refer to ENT if needed      Follow up plan: No Follow-up on file.  An after-visit summary was printed and given to the patient at [place].  Please see the patient instructions which may contain other information and recommendations beyond what is mentioned above in the assessment and plan.  Meds ordered this encounter  Medications  . predniSONE (DELTASONE) 20 MG tablet    Sig: Take 1 tablet (20 mg total) by mouth daily for 5 days. With food    Dispense:  10 tablet    Refill:  0    No orders of the defined types were placed in this encounter.

## 2017-05-23 ENCOUNTER — Encounter: Payer: BLUE CROSS/BLUE SHIELD | Admitting: Family Medicine

## 2017-07-21 ENCOUNTER — Ambulatory Visit: Payer: BLUE CROSS/BLUE SHIELD | Admitting: Nurse Practitioner

## 2017-07-27 ENCOUNTER — Ambulatory Visit: Payer: BLUE CROSS/BLUE SHIELD | Admitting: Nurse Practitioner

## 2017-08-04 ENCOUNTER — Ambulatory Visit: Payer: BLUE CROSS/BLUE SHIELD | Admitting: Nurse Practitioner

## 2017-08-16 ENCOUNTER — Emergency Department
Admission: EM | Admit: 2017-08-16 | Discharge: 2017-08-16 | Disposition: A | Discharge status: Home / Self Care | Payer: BLUE CROSS/BLUE SHIELD | Attending: Emergency Medicine | Admitting: Emergency Medicine

## 2017-08-16 ENCOUNTER — Ambulatory Visit (INDEPENDENT_AMBULATORY_CARE_PROVIDER_SITE_OTHER): Payer: BLUE CROSS/BLUE SHIELD | Admitting: Family Medicine

## 2017-08-16 ENCOUNTER — Emergency Department: Payer: BLUE CROSS/BLUE SHIELD

## 2017-08-16 ENCOUNTER — Encounter: Payer: BLUE CROSS/BLUE SHIELD | Admitting: Family Medicine

## 2017-08-16 ENCOUNTER — Encounter: Payer: Self-pay | Admitting: Family Medicine

## 2017-08-16 ENCOUNTER — Encounter: Payer: Self-pay | Admitting: Emergency Medicine

## 2017-08-16 ENCOUNTER — Other Ambulatory Visit: Payer: Self-pay

## 2017-08-16 VITALS — BP 112/76 | HR 90 | Temp 97.9°F | Resp 14 | Ht 68.0 in | Wt 199.2 lb

## 2017-08-16 DIAGNOSIS — D270 Benign neoplasm of right ovary: Secondary | ICD-10-CM | POA: Diagnosis not present

## 2017-08-16 DIAGNOSIS — E669 Obesity, unspecified: Secondary | ICD-10-CM | POA: Diagnosis not present

## 2017-08-16 DIAGNOSIS — F17210 Nicotine dependence, cigarettes, uncomplicated: Secondary | ICD-10-CM | POA: Insufficient documentation

## 2017-08-16 DIAGNOSIS — R0789 Other chest pain: Secondary | ICD-10-CM | POA: Insufficient documentation

## 2017-08-16 DIAGNOSIS — N200 Calculus of kidney: Secondary | ICD-10-CM | POA: Diagnosis not present

## 2017-08-16 DIAGNOSIS — Z79899 Other long term (current) drug therapy: Secondary | ICD-10-CM | POA: Diagnosis not present

## 2017-08-16 DIAGNOSIS — R0781 Pleurodynia: Secondary | ICD-10-CM

## 2017-08-16 DIAGNOSIS — Z23 Encounter for immunization: Secondary | ICD-10-CM | POA: Diagnosis not present

## 2017-08-16 DIAGNOSIS — Z720 Tobacco use: Secondary | ICD-10-CM

## 2017-08-16 DIAGNOSIS — Z0000 Encounter for general adult medical examination without abnormal findings: Secondary | ICD-10-CM

## 2017-08-16 DIAGNOSIS — R1012 Left upper quadrant pain: Secondary | ICD-10-CM | POA: Insufficient documentation

## 2017-08-16 DIAGNOSIS — N9489 Other specified conditions associated with female genital organs and menstrual cycle: Secondary | ICD-10-CM

## 2017-08-16 DIAGNOSIS — R109 Unspecified abdominal pain: Secondary | ICD-10-CM

## 2017-08-16 DIAGNOSIS — R079 Chest pain, unspecified: Secondary | ICD-10-CM | POA: Diagnosis not present

## 2017-08-16 DIAGNOSIS — Z1159 Encounter for screening for other viral diseases: Secondary | ICD-10-CM

## 2017-08-16 DIAGNOSIS — N839 Noninflammatory disorder of ovary, fallopian tube and broad ligament, unspecified: Secondary | ICD-10-CM | POA: Diagnosis not present

## 2017-08-16 DIAGNOSIS — R1909 Other intra-abdominal and pelvic swelling, mass and lump: Secondary | ICD-10-CM | POA: Diagnosis not present

## 2017-08-16 LAB — CBC WITH DIFFERENTIAL/PLATELET
Basophils Absolute: 0.1 10*3/uL (ref 0–0.1)
Basophils Relative: 1 %
Eosinophils Absolute: 0.2 10*3/uL (ref 0–0.7)
Eosinophils Relative: 2 %
HEMATOCRIT: 45.3 % (ref 35.0–47.0)
Hemoglobin: 15.5 g/dL (ref 12.0–16.0)
LYMPHS ABS: 3.4 10*3/uL (ref 1.0–3.6)
LYMPHS PCT: 33 %
MCH: 30.6 pg (ref 26.0–34.0)
MCHC: 34.2 g/dL (ref 32.0–36.0)
MCV: 89.5 fL (ref 80.0–100.0)
MONO ABS: 0.5 10*3/uL (ref 0.2–0.9)
MONOS PCT: 5 %
NEUTROS ABS: 6.2 10*3/uL (ref 1.4–6.5)
Neutrophils Relative %: 59 %
Platelets: 247 10*3/uL (ref 150–440)
RBC: 5.06 MIL/uL (ref 3.80–5.20)
RDW: 13.7 % (ref 11.5–14.5)
WBC: 10.3 10*3/uL (ref 3.6–11.0)

## 2017-08-16 LAB — URINALYSIS, COMPLETE (UACMP) WITH MICROSCOPIC
BACTERIA UA: NONE SEEN
Bilirubin Urine: NEGATIVE
Glucose, UA: NEGATIVE mg/dL
Ketones, ur: 5 mg/dL — AB
LEUKOCYTES UA: NEGATIVE
NITRITE: NEGATIVE
PROTEIN: NEGATIVE mg/dL
SPECIFIC GRAVITY, URINE: 1.023 (ref 1.005–1.030)
pH: 6 (ref 5.0–8.0)

## 2017-08-16 LAB — COMPREHENSIVE METABOLIC PANEL
ALK PHOS: 81 U/L (ref 38–126)
ALT: 21 U/L (ref 14–54)
ANION GAP: 10 (ref 5–15)
AST: 24 U/L (ref 15–41)
Albumin: 4.1 g/dL (ref 3.5–5.0)
BILIRUBIN TOTAL: 0.6 mg/dL (ref 0.3–1.2)
BUN: 23 mg/dL — AB (ref 6–20)
CALCIUM: 9.2 mg/dL (ref 8.9–10.3)
CO2: 23 mmol/L (ref 22–32)
Chloride: 106 mmol/L (ref 101–111)
Creatinine, Ser: 0.65 mg/dL (ref 0.44–1.00)
GFR calc Af Amer: >60 mL/min (ref >60)
Glucose, Bld: 94 mg/dL (ref 65–99)
POTASSIUM: 3.7 mmol/L (ref 3.5–5.1)
Sodium: 139 mmol/L (ref 135–145)
TOTAL PROTEIN: 6.8 g/dL (ref 6.5–8.1)

## 2017-08-16 LAB — TROPONIN I

## 2017-08-16 LAB — LIPASE, BLOOD: LIPASE: 25 U/L (ref 11–51)

## 2017-08-16 MED ORDER — IOPAMIDOL (ISOVUE-300) INJECTION 61%
75.0000 mL | Freq: Once | INTRAVENOUS | Status: AC | PRN
  Administered 2017-08-16: 75 mL via INTRAVENOUS

## 2017-08-16 MED ORDER — TETANUS-DIPHTH-ACELL PERTUSSIS 5-2.5-18.5 LF-MCG/0.5 IM SUSP
0.5000 mL | Freq: Once | INTRAMUSCULAR | Status: AC
  Administered 2017-08-16: 0.5 mL via INTRAMUSCULAR

## 2017-08-16 MED ORDER — SODIUM CHLORIDE 0.9 % IV BOLUS
1000.0000 mL | Freq: Once | INTRAVENOUS | Status: AC
  Administered 2017-08-16: 1000 mL via INTRAVENOUS

## 2017-08-16 NOTE — ED Triage Notes (Signed)
C/O pain to left breast through to back x 1 day.  C/O soreness to upper back.  States has history of kidney stones.  Symptoms also accompanied by nausea and vomiting.

## 2017-08-16 NOTE — Assessment & Plan Note (Signed)
Risk factor for heart disease; will try to help patient quit at f/u; will plan to order chest CT for lung cancer screening (unless chest CT done as part of work-up in ER today); discuss at physical in a few weeks

## 2017-08-16 NOTE — ED Provider Notes (Addendum)
[facility] Emergency Department Provider Note ____________________________________________   First MD Initiated Contact with Patient 08/16/17 [phone]     (approximate)  I have reviewed the triage vital signs and the nursing notes.   HISTORY  Chief Complaint Flank Pain  HPI Sharon Wright is a 57 y.o. female with a history of kidney stones was presented to the emergency department left upper quadrant abdominal pain which is radiating into her left chest and left back.  She denies any burning with urination or urinary frequency.  States the pain started yesterday and was worsened by movement/abduction of her left upper extremity.  She states the pain is decreased and is now a pressure-like pain that is a 3-4 out of 10.  She had mild nausea as well yesterday but is been able to tolerate p.o. fluids as of this morning.  No vomiting.  Past Medical History:  Diagnosis Date  . Chronic kidney disease    kidney stones  . Kidney stones   . Tobacco abuse 03/22/2017    Patient Active Problem List   Diagnosis Date Noted  . Obesity (BMI 30.0-34.9) 08/16/2017  . Tobacco abuse 03/22/2017    Past Surgical History:  Procedure Laterality Date  . back syugery  1993  . Benign tumor  1993  . BUNIONECTOMY Left 2006  . LITHOTRIPSY    . 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 Authorizing Provider  cholecalciferol (VITAMIN D) 1000 UNITS tablet Take 1,000 Units by mouth daily.    Yes [provider]  ibuprofen (ADVIL,MOTRIN) 200 MG tablet Take 400 mg by mouth every 6 (six) hours as needed for fever or moderate pain.    Yes [provider]  polyethylene glycol (MIRALAX / GLYCOLAX) packet Take 17 g by mouth daily as needed for mild constipation.    Yes [provider]  acetaminophen (TYLENOL) 500 MG tablet Take 500 mg by mouth every 6 (six) hours as needed  for moderate pain.    [provider]    Allergies Oxycodone and Penicillins  Family History  Problem Relation Age of Onset  . Congestive Heart Failure Mother   . Heart disease Mother   . Diabetes Father   . Heart disease Father   . Dementia Father   . Heart disease Sister     Social History Social History   Tobacco Use  . Smoking status: Current Every Day Smoker    Packs/day: 1.00    Years: 35.00    Pack years: 35.00    Types: Cigarettes  . Smokeless tobacco: Never Used  Substance Use Topics  . Alcohol use: No  . Drug use: No    Review of Systems  Constitutional: No fever/chills Eyes: No visual changes. ENT: No sore throat. Cardiovascular: As above Respiratory: No respiratory distress.  No shortness of breath. Gastrointestinal: no vomiting.  No diarrhea.  No constipation. Genitourinary: Negative for dysuria. Musculoskeletal: As above Skin: Negative for rash. Neurological: Negative for headaches, focal weakness or numbness.   ____________________________________________   PHYSICAL EXAM:  VITAL SIGNS: ED Triage Vitals  Enc Vitals Group     BP 08/16/17 0911 (!) 138/98     Pulse Rate 08/16/17 0911 69     Resp 08/16/17 0911 16     Temp 08/16/17 0911 98 F (36.7 C)     Temp Source 08/16/17 0911 Oral  SpO2 08/16/17 0911 97 %     Weight 08/16/17 0910 199 lb (90.3 kg)     Height 08/16/17 0910 5\' 8"  (1.727 m)     Head Circumference --      Peak Flow --      Pain Score 08/16/17 0910 5     Pain Loc --      Pain Edu? --      Excl. in [place]? --     Constitutional: Alert and oriented. Well appearing and in no acute distress. Eyes: Conjunctivae are normal.  Head: Atraumatic. Nose: No congestion/rhinnorhea. Mouth/Throat: Mucous membranes are moist.  Neck: No stridor.   Cardiovascular: Normal rate, regular rhythm. Grossly normal heart sounds.  Tenderness to palpation over the left lateral as well as anterior chest wall without any deformity or  crepitus. Respiratory: Normal respiratory effort.  No retractions. Lungs CTAB. Gastrointestinal: Soft with mild tenderness to the epigastrium as well as the left upper quadrant.  Negative Murphy sign.  No distention. No CVA tenderness. Musculoskeletal: No lower extremity tenderness nor edema.  No joint effusions. Neurologic:  Normal speech and language. No gross focal neurologic deficits are appreciated. Skin:  Skin is warm, dry and intact. No rash noted. Psychiatric: Mood and affect are normal. Speech and behavior are normal.  ____________________________________________   LABS (all labs ordered are listed, but only abnormal results are displayed)  Labs Reviewed  COMPREHENSIVE METABOLIC PANEL - Abnormal; Notable for the following components:      Result Value   BUN 23 (*)    All other components within normal limits  URINALYSIS, COMPLETE (UACMP) WITH MICROSCOPIC - Abnormal; Notable for the following components:   Color, Urine YELLOW (*)    APPearance CLEAR (*)    Hgb urine dipstick SMALL (*)    Ketones, ur 5 (*)    All other components within normal limits  CBC WITH DIFFERENTIAL/PLATELET  TROPONIN I  LIPASE, BLOOD   ____________________________________________  EKG  ED ECG REPORT I, Doran Stabler, the attending physician, personally viewed and interpreted this ECG.   Date: 08/16/2017  EKG Time: 0913  Rate: 62  Rhythm: normal sinus rhythm  Axis: Normal  Intervals:none  ST&T Change: No ST segment elevation or depression.  No abnormal T wave inversion. ED ECG REPORT I, Doran Stabler, the attending physician, personally viewed and interpreted this ECG.   Date: 08/16/2017  EKG Time: 0928  Rate: 66  Rhythm: normal sinus rhythm  Axis: Normal  Intervals:none  ST&T Change: No ST segment elevation or depression.  No abnormal T wave inversion.  ____________________________________________  RADIOLOGY  New right adnexal mass.  Chest x-ray without acute finding.   No acute finding on CT angiography of the chest. ____________________________________________   PROCEDURES  Procedure(s) performed:   Procedures  Critical Care performed:   ____________________________________________   INITIAL IMPRESSION / ASSESSMENT AND PLAN / ED COURSE  Pertinent labs & imaging results that were available during my care of the patient were reviewed by me and considered in my medical decision making (see chart for details).  Differential diagnosis includes, but is not limited to, ACS, aortic dissection, pulmonary embolism, cardiac tamponade, pneumothorax, pneumonia, pericarditis, myocarditis, GI-related causes including esophagitis/gastritis, and musculoskeletal chest wall pain.   Differential diagnosis includes, but is not limited to, biliary disease (biliary colic, acute cholecystitis, cholangitis, choledocholithiasis, etc), intrathoracic causes for epigastric abdominal pain including ACS, gastritis, duodenitis, pancreatitis, small bowel or large bowel obstruction, abdominal aortic aneurysm, hernia, and ulcer(s).  ----------------------------------------- 12:44 PM  on 08/16/2017 -----------------------------------------  Patient at this time without any further complaint.  New right lower quadrant mass which is likely unrelated to her pain.  My concern was that if this was was a new cancer that the patient could have had a pulmonary embolus.  However, the chest x-ray as well as the CT angiography were clear.  Patient aware of the new right adnexal mass will be following up with her OB/GYN.  Will also be given follow-up information for on-call OB/GYN because she says that she is unsure of her OB/GYN, Dr. Deatra Ina, still in practice.  Left upper quadrant/lower chest pain likely related to chest wall and abdominal wall issues.  Worsens with movement.  Very reassuring cardiac work-up after proximal May 24 hours of symptoms.  Patient to follow-up with primary care.   Understanding of the treatment plan willing to comply. ____________________________________________   FINAL CLINICAL IMPRESSION(S) / ED DIAGNOSES  Chest wall pain.  Left upper quadrant pain.  Right ovarian mass.    NEW MEDICATIONS STARTED DURING THIS VISIT:  New Prescriptions   No medications on file     Note:  This document was prepared using Dragon voice recognition software and may include unintentional dictation errors.     Orbie Pyo, MD 08/16/17 1247  Discussed with the patient that although we are not able to fully differentiate what exactly the mass is from the CAT scan that could possibly be cancer and that she needs urgent follow-up with OB/GYN to fully differentiate.  She is understanding of this and willing to comply.    Orbie Pyo, MD 08/16/17 1250

## 2017-08-16 NOTE — Progress Notes (Addendum)
Patient ID: Sharon Wright, female   DOB: [birth year], 57 y.o.   MRN: [id number]   Subjective:   Sharon Wright is a 57 y.o. female here for a complete physical exam  Patient is here for a complete physical; however, she is having left sided chest pain right now; started out deep, but sore to the touch in the back; started yesterday; prone to kidney stones, but no blood in the urine; nausea yesterday and none today; shortness of breath; she is a smoker; "I probably do have blockages"; "I like to eat and I like to smoke"; obese   USPSTF grade A and B recommendations Depression:  Depression screen [facility] 2/9 08/16/2017 03/22/2017  Decreased Interest 0 0  Down, Depressed, Hopeless 0 0  PHQ - 2 Score 0 0   Hypertension: BP Readings from Last 3 Encounters:  08/16/17 105/69  08/16/17 112/76  03/22/17 124/78   Obesity: Wt Readings from Last 3 Encounters:  08/16/17 199 lb (90.3 kg)  08/16/17 199 lb 3.2 oz (90.4 kg)  03/22/17 208 lb 11.2 oz (94.7 kg)   BMI Readings from Last 3 Encounters:  08/16/17 30.26 kg/m  08/16/17 30.29 kg/m  03/22/17 31.73 kg/m    Cervical cancer screening: through GYN Immunizations: tetanus today  Past Medical History:  Diagnosis Date  . Chronic kidney disease    kidney stones  . Kidney stones   . Tobacco abuse 03/22/2017   Past Surgical History:  Procedure Laterality Date  . back syugery  1993  . Benign tumor  1993  . BUNIONECTOMY Left 2006  . LITHOTRIPSY    . 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 Congestive Heart Failure Mother   . Heart disease Mother   . Diabetes Father   . Heart disease Father   . Dementia Father   . Heart disease Sister    Social History   Tobacco Use  . Smoking status: Current Every Day Smoker    Packs/day: 1.00    Years: 35.00    Pack years: 35.00    Types: Cigarettes  . Smokeless tobacco: Never Used  Substance Use Topics  . Alcohol use: No   . Drug use: No   Review of Systems  Objective:   Vitals:   08/16/17 0816  BP: 112/76  Pulse: 90  Resp: 14  Temp: 97.9 F (36.6 C)  TempSrc: Oral  SpO2: 95%  Weight: 199 lb 3.2 oz (90.4 kg)  Height: 5\' 8"  (1.727 m)   Body mass index is 30.29 kg/m. Wt Readings from Last 3 Encounters:  08/16/17 199 lb (90.3 kg)  08/16/17 199 lb 3.2 oz (90.4 kg)  03/22/17 208 lb 11.2 oz (94.7 kg)   Physical Exam  Constitutional: She appears well-developed and well-nourished.  HENT:  Mouth/Throat: Mucous membranes are normal.  Eyes: EOM are normal. No scleral icterus.  Cardiovascular: Normal rate and regular rhythm.  No extrasystoles are present.  Pulmonary/Chest: Effort normal and breath sounds normal.  Abdominal: Soft. Normal appearance. There is CVA tenderness (LEFT).  Skin: She is not diaphoretic. No pallor.  Psychiatric: She has a normal mood and affect. Her behavior is normal.    Assessment/Plan:   Problem List Items Addressed This Visit      Other   Tobacco abuse    Risk factor for heart disease; will try to help patient quit at f/u; will plan to order chest CT for lung  cancer screening (unless chest CT done as part of work-up in ER today); discuss at physical in a few weeks      Obesity (BMI 30.0-34.9)    Risk factor for heart disease; explained that I'll help her lose and we'll discuss at f/u; see AVS       Other Visit Diagnoses    Chest pain, unspecified type    -  Primary   w/left CVAT; ddx includes MI, pyelo, PE, pneumonia, etc; mult cardiac risk factors; I do not have have POCT urine to check for blood; send to ER; she declin EMS   Preventative health care       visit stopped and she'll return for a physical in a few weeks, but her chest pain, side pain, back pain more pressing at this moment; she agrees   Encounter for hepatitis C screening test for low risk patient       Relevant Orders   Hepatitis C Antibody   Need for Tdap vaccination       Relevant  Medications   Tdap (BOOSTRIX) injection 0.5 mL (Completed)   Rib pain on left side       Relevant Orders   EKG 12-Lead (Completed)      12-lead EKG; NSR  Meds ordered this encounter  Medications  . Tdap (BOOSTRIX) injection 0.5 mL   Orders Placed This Encounter  Procedures  . Hepatitis C Antibody  . EKG 12-Lead    Follow up plan: Return in about 2 weeks (around 08/30/2017) for complete physical.  An After Visit Summary was printed and given to the patient.

## 2017-08-16 NOTE — ED Notes (Signed)
ED Provider at bedside. 

## 2017-08-16 NOTE — Patient Instructions (Addendum)
Check out the information at familydoctor.org entitled "Nutrition for Weight Loss: What You Need to Know about Fad Diets" Try to lose between 1-2 pounds per week by taking in fewer calories and burning off more calories You can succeed by limiting portions, limiting foods dense in calories and fat, becoming more active, and drinking 8 glasses of water a day (64 ounces) Don't skip meals, especially breakfast, as skipping meals may alter your metabolism Do not use over-the-counter weight loss pills or gimmicks that claim rapid weight loss A healthy BMI (or body mass index) is between 18.5 and 24.9 You can calculate your ideal BMI at the [place] website ClubMon[employer]fr  Please go right now to the emergency room

## 2017-08-16 NOTE — Assessment & Plan Note (Signed)
Risk factor for heart disease; explained that I'll help her lose and we'll discuss at f/u; see AVS

## 2017-08-24 DIAGNOSIS — Z01419 Encounter for gynecological examination (general) (routine) without abnormal findings: Secondary | ICD-10-CM | POA: Diagnosis not present

## 2017-08-24 DIAGNOSIS — Z6830 Body mass index (BMI) 30.0-30.9, adult: Secondary | ICD-10-CM | POA: Diagnosis not present

## 2017-08-24 DIAGNOSIS — Z124 Encounter for screening for malignant neoplasm of cervix: Secondary | ICD-10-CM | POA: Diagnosis not present

## 2017-08-24 DIAGNOSIS — R1909 Other intra-abdominal and pelvic swelling, mass and lump: Secondary | ICD-10-CM | POA: Diagnosis not present

## 2017-08-24 LAB — HM PAP SMEAR
HM Pap smear: NORMAL
HM Pap smear: NORMAL

## 2017-12-12 ENCOUNTER — Other Ambulatory Visit: Payer: Self-pay

## 2017-12-12 ENCOUNTER — Ambulatory Visit: Payer: BLUE CROSS/BLUE SHIELD

## 2017-12-12 ENCOUNTER — Telehealth: Payer: Self-pay

## 2017-12-12 ENCOUNTER — Encounter: Payer: Self-pay | Admitting: Nurse Practitioner

## 2017-12-12 ENCOUNTER — Other Ambulatory Visit
Admission: RE | Admit: 2017-12-12 | Discharge: 2017-12-12 | Disposition: A | Discharge status: Home / Self Care | Payer: BLUE CROSS/BLUE SHIELD | Source: Ambulatory Visit | Attending: Nurse Practitioner | Admitting: Nurse Practitioner

## 2017-12-12 ENCOUNTER — Ambulatory Visit
Admission: RE | Admit: 2017-12-12 | Discharge: 2017-12-12 | Disposition: A | Discharge status: Home / Self Care | Payer: BLUE CROSS/BLUE SHIELD | Source: Ambulatory Visit | Attending: Family Medicine | Admitting: Family Medicine

## 2017-12-12 ENCOUNTER — Ambulatory Visit: Payer: BLUE CROSS/BLUE SHIELD | Admitting: Nurse Practitioner

## 2017-12-12 VITALS — BP 128/76 | HR 72 | Temp 98.0°F | Resp 16 | Ht 68.0 in | Wt 191.7 lb

## 2017-12-12 DIAGNOSIS — R109 Unspecified abdominal pain: Secondary | ICD-10-CM

## 2017-12-12 DIAGNOSIS — R310 Gross hematuria: Secondary | ICD-10-CM

## 2017-12-12 DIAGNOSIS — R319 Hematuria, unspecified: Secondary | ICD-10-CM

## 2017-12-12 DIAGNOSIS — R1032 Left lower quadrant pain: Secondary | ICD-10-CM | POA: Diagnosis not present

## 2017-12-12 DIAGNOSIS — N2889 Other specified disorders of kidney and ureter: Secondary | ICD-10-CM | POA: Insufficient documentation

## 2017-12-12 DIAGNOSIS — R10A2 Flank pain, left side: Secondary | ICD-10-CM

## 2017-12-12 LAB — POCT URINALYSIS DIPSTICK
BILIRUBIN UA: NEGATIVE
GLUCOSE UA: NEGATIVE
Ketones, UA: NEGATIVE
LEUKOCYTES UA: NEGATIVE
Nitrite, UA: NEGATIVE
Odor: NORMAL
Protein, UA: NEGATIVE
Spec Grav, UA: 1.015 (ref 1.010–1.025)
Urobilinogen, UA: 0.2 E.U./dL
pH, UA: 6.5 (ref 5.0–8.0)

## 2017-12-12 LAB — BASIC METABOLIC PANEL
Anion gap: 6 (ref 5–15)
BUN: 26 mg/dL — AB (ref 6–20)
CHLORIDE: 106 mmol/L (ref 98–111)
CO2: 25 mmol/L (ref 22–32)
Calcium: 9.3 mg/dL (ref 8.9–10.3)
Creatinine, Ser: 0.61 mg/dL (ref 0.44–1.00)
GFR calc Af Amer: >60 mL/min (ref >60)
GFR calc non Af Amer: >60 mL/min (ref >60)
GLUCOSE: 89 mg/dL (ref 70–99)
Potassium: 4 mmol/L (ref 3.5–5.1)
Sodium: 137 mmol/L (ref 135–145)

## 2017-12-12 MED ORDER — TAMSULOSIN HCL 0.4 MG PO CAPS: 0.4000 mg | ORAL_CAPSULE | Freq: Every day | ORAL | 0 refills | Status: DC

## 2017-12-12 MED ORDER — ONDANSETRON 4 MG PO TBDP: 4.0000 mg | ORAL_TABLET | Freq: Three times a day (TID) | ORAL | 0 refills | Status: DC | PRN

## 2017-12-12 MED ORDER — KETOROLAC TROMETHAMINE 10 MG PO TABS: 10.0000 mg | ORAL_TABLET | Freq: Four times a day (QID) | ORAL | 0 refills | Status: DC | PRN

## 2017-12-12 NOTE — Progress Notes (Signed)
Name: Sharon Wright   MRN: [id number]    DOB: [birth year]   Date:12/12/2017       Progress Note  Subjective  Chief Complaint  Chief Complaint  Patient presents with  . Flank Pain    onset last night, hx of kidney stones    HPI  Patient c/o left flank pain  Radiates to left groin started last night, states it was severe. Feels like kidney stone- has had 3 episodes before last one was 5 days ago. All previous kidney stones required lithotripsy due to size. States decreased urination, had redish, brown tint to urine. Does heavy, hard work daily- works in [employer]- states no known injuries for this. Has been taking tylenol and ibuprofen for pain with minimal relief.  Patient Active Problem List   Diagnosis Date Noted  . Obesity (BMI 30.0-34.9) 08/16/2017  . Tobacco abuse 03/22/2017    Past Medical History:  Diagnosis Date  . Chronic kidney disease    kidney stones  . Kidney stones   . Tobacco abuse 03/22/2017    Past Surgical History:  Procedure Laterality Date  . back syugery  1993  . Benign tumor  1993  . BUNIONECTOMY Left 2006  . LITHOTRIPSY    . 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 Smoking status: Current Every Day Smoker    Packs/day: 1.00    Years: 35.00    Pack years: 35.00    Types: Cigarettes  . Smokeless tobacco: Never Used  Substance Use Topics  . Alcohol use: No     Current Outpatient Medications:  .  acetaminophen (TYLENOL) 500 MG tablet, Take 500 mg by mouth every 6 (six) hours as needed for moderate pain., Disp: , Rfl:  .  cholecalciferol (VITAMIN D) 1000 UNITS tablet, Take 1,000 Units by mouth daily. , Disp: , Rfl:  .  ibuprofen (ADVIL,MOTRIN) 200 MG tablet, Take 400 mg by mouth every 6 (six) hours as needed for fever or moderate pain. , Disp: , Rfl:  .  polyethylene glycol (MIRALAX / GLYCOLAX) packet, Take 17 g by mouth daily as needed for mild constipation. , Disp: , Rfl:   Allergies   Allergen Reactions  . Oxycodone Nausea And Vomiting  . Penicillins Rash    Childhood allergy Has patient had a PCN reaction causing immediate rash, facial/tongue/throat swelling, SOB or lightheadedness with hypotension: Yes Has patient had a PCN reaction causing severe rash involving mucus membranes or skin necrosis: No Has patient had a PCN reaction that required hospitalization: No Has patient had a PCN reaction occurring within the last 10 years: No If all of the above answers are "NO", then may proceed with Cephalosporin use.     ROS   No other specific complaints in a complete review of systems (except as listed in HPI above).  Objective  Vitals:   12/12/17 1554  BP: 128/76  Pulse: 72  Temp: 98 F (36.7 C)  SpO2: 99%  Weight: 191 lb 11.2 oz (87 kg)  Height: 5\' 8"  (1.727 m)     Body mass index is 29.15 kg/m.  Nursing Note and Vital Signs reviewed.  Physical Exam  Constitutional: She appears well-developed and well-nourished. She is cooperative.  HENT:  Head: Normocephalic.  Right Ear: Hearing normal.  Left Ear: Hearing normal.  Nose: Nose normal.  Mouth/Throat: Oropharynx is clear and moist and mucous membranes are normal.  Cardiovascular: Normal rate, regular  rhythm and normal heart sounds.  No lower extremity edema noted.  Pulmonary/Chest: Effort normal and breath sounds normal.  Abdominal: Soft. Normal appearance and bowel sounds are normal.  Left CVA tenderness  Musculoskeletal: Normal range of motion. She exhibits no deformity.  Neurological: She is alert.  Skin: Skin is warm and dry. No rash noted.  Psychiatric: She has a normal mood and affect. Her speech is normal and behavior is normal. Judgment and thought content normal.       No results found for this or any previous visit (from the past 48 hour(s)).  Assessment & Plan  1. Acute left flank pain - POCT Urinalysis Dipstick - tamsulosin (FLOMAX) 0.4 MG CAPS capsule; Take 1 capsule (0.4 mg  total) by mouth daily.  Dispense: 14 capsule; Refill: 0 - ketorolac (TORADOL) 10 MG tablet; Take 1 tablet (10 mg total) by mouth every 6 (six) hours as needed. Must take with food, Do not take ibuprofen or aleve.  Dispense: 20 tablet; Refill: 0 - ondansetron (ZOFRAN ODT) 4 MG disintegrating tablet; Take 1 tablet (4 mg total) by mouth every 8 (eight) hours as needed for nausea or vomiting.  Dispense: 15 tablet; Refill: 0 - CT RENAL STONE STUDY; Future - Basic Metabolic Panel (BMET); Future  2. Left lower quadrant abdominal pain  - CT RENAL STONE STUDY; Future  3. Gross hematuria - CT RENAL STONE STUDY; Future   Likely kidney stone, due to history of large size recommend CT scan to evaluate if she is able to pass on her own or not. Stop NSAIDs start ketoralac, discussed ER precautions.

## 2017-12-12 NOTE — Telephone Encounter (Signed)
FYI patients insurance would not cover STAT CT per referral coordinator Melissa without a per to per so, we ordered KUB instead per Dr. Ancil Boozer

## 2017-12-12 NOTE — Patient Instructions (Addendum)
-   Please drink plenty of water at least 80 ounces day.  - Sending you to the hospital to get labs and CT scan completed to see if and how large stones are.  - Please take medications as prescribed for pain, if pain becomes severe, you are unable to keep medications down, or do not pee within 8 hours please go to the ER for further management.  - can continue to take acetaminophen for pain as well as ketoralac but please do not take any other NSAIDs like ibuprofen or aleve while on this medication. Take this medications with food.    Kidney Stones Kidney stones (urolithiasis) are rock-like masses that form inside of the kidneys. Kidneys are organs that make pee (urine). A kidney stone can cause very bad pain and can block the flow of pee. The stone usually leaves your body (passes) through your pee. You may need to have a doctor take out the stone. Follow these instructions at home: Eating and drinking  Drink enough fluid to keep your pee clear or pale yellow. This will help you pass the stone.  If told by your doctor, change the foods you eat (your diet). This may include: ? Limiting how much salt (sodium) you eat. ? Eating more fruits and vegetables. ? Limiting how much meat, poultry, fish, and eggs you eat.  Follow instructions from your doctor about eating or drinking restrictions. General instructions  Collect pee samples as told by your doctor. You may need to collect a pee sample: ? 24 hours after a stone comes out. ? 8-12 weeks after a stone comes out, and every 6-12 months after that.  Strain your pee every time you pee (urinate), for as long as told. Use the strainer that your doctor recommends.  Do not throw out the stone. Keep it so that it can be tested by your doctor.  Take over-the-counter and prescription medicines only as told by your doctor.  Keep all follow-up visits as told by your doctor. This is important. You may need follow-up tests. Preventing kidney stones To  prevent another kidney stone:  Drink enough fluid to keep your pee clear or pale yellow. This is the best way to prevent kidney stones.  Eat healthy foods.  Avoid certain foods as told by your doctor. You may be told to eat less protein.  Stay at a healthy weight.  Contact a doctor if:  You have pain that gets worse or does not get better with medicine. Get help right away if:  You have a fever or chills.  You get very bad pain.  You get new pain in your belly (abdomen).  You pass out (faint).  You cannot pee. This information is not intended to replace advice given to you by your health care provider. Make sure you discuss any questions you have with your health care provider. Document Released: 08/04/2007 Document Revised: 11/04/2015 Document Reviewed: 11/04/2015 Elsevier Interactive Patient Education  2017 [employer].

## 2017-12-13 NOTE — Telephone Encounter (Signed)
I did not order this and did not see the patient, so I'm going to ask you to route it to the appropriate provider please

## 2017-12-13 NOTE — Telephone Encounter (Signed)
Called patient and updated on results, having left flank pain but showing right nephrolithiasis. Patient notes frank blood in urine today. Urgent referral to urology placed, discussed ER precautions.

## 2017-12-13 NOTE — Telephone Encounter (Signed)
Copied from [place] [id number]. Topic: General - Other >> Dec 13, 2017  8:23 AM Conception Chancy, NT wrote: Reason for CRM: patient husband is calling and wanting to know the xray results from 12/12/17.

## 2017-12-13 NOTE — Telephone Encounter (Signed)
Please review KUB

## 2017-12-13 NOTE — Telephone Encounter (Signed)
Pt called in wanting to know if her results were ready, advised Pt that the message was routed to the correct provider, and gave number.

## 2017-12-14 DIAGNOSIS — N201 Calculus of ureter: Secondary | ICD-10-CM | POA: Diagnosis not present

## 2017-12-14 DIAGNOSIS — R8271 Bacteriuria: Secondary | ICD-10-CM | POA: Diagnosis not present

## 2017-12-14 DIAGNOSIS — N200 Calculus of kidney: Secondary | ICD-10-CM | POA: Diagnosis not present

## 2017-12-15 ENCOUNTER — Encounter: Payer: Self-pay | Admitting: Family Medicine

## 2017-12-27 ENCOUNTER — Other Ambulatory Visit (HOSPITAL_COMMUNITY): Payer: Self-pay | Admitting: Obstetrics & Gynecology

## 2017-12-27 DIAGNOSIS — R102 Pelvic and perineal pain: Secondary | ICD-10-CM

## 2017-12-29 ENCOUNTER — Ambulatory Visit (HOSPITAL_COMMUNITY): Payer: BLUE CROSS/BLUE SHIELD

## 2017-12-30 ENCOUNTER — Ambulatory Visit (HOSPITAL_COMMUNITY)
Admission: RE | Admit: 2017-12-30 | Discharge: 2017-12-30 | Disposition: A | Discharge status: Home / Self Care | Payer: BLUE CROSS/BLUE SHIELD | Source: Ambulatory Visit | Attending: Obstetrics & Gynecology | Admitting: Obstetrics & Gynecology

## 2017-12-30 DIAGNOSIS — R1900 Intra-abdominal and pelvic swelling, mass and lump, unspecified site: Secondary | ICD-10-CM | POA: Insufficient documentation

## 2017-12-30 DIAGNOSIS — R102 Pelvic and perineal pain: Secondary | ICD-10-CM

## 2018-01-02 DIAGNOSIS — R1900 Intra-abdominal and pelvic swelling, mass and lump, unspecified site: Secondary | ICD-10-CM | POA: Diagnosis not present

## 2018-01-03 ENCOUNTER — Telehealth: Payer: Self-pay | Admitting: *Deleted

## 2018-01-03 DIAGNOSIS — N23 Unspecified renal colic: Secondary | ICD-10-CM | POA: Diagnosis not present

## 2018-01-03 DIAGNOSIS — N202 Calculus of kidney with calculus of ureter: Secondary | ICD-10-CM | POA: Diagnosis not present

## 2018-01-03 NOTE — Telephone Encounter (Signed)
Sharon Wright from [facility] called back. Patient scheduled to see Dr. Gerarda Fraction on 11/13 at 11am. She will call the patient

## 2018-01-11 ENCOUNTER — Inpatient Hospital Stay: Payer: BLUE CROSS/BLUE SHIELD

## 2018-01-11 ENCOUNTER — Encounter: Payer: Self-pay | Admitting: Obstetrics

## 2018-01-11 ENCOUNTER — Inpatient Hospital Stay: Payer: BLUE CROSS/BLUE SHIELD | Attending: Obstetrics | Admitting: Obstetrics

## 2018-01-11 VITALS — BP 113/53 | HR 93 | Temp 98.1°F | Resp 20 | Ht 69.0 in | Wt 185.0 lb

## 2018-01-11 DIAGNOSIS — R1900 Intra-abdominal and pelvic swelling, mass and lump, unspecified site: Secondary | ICD-10-CM | POA: Insufficient documentation

## 2018-01-11 HISTORY — DX: Intra-abdominal and pelvic swelling, mass and lump, unspecified site: R19.00

## 2018-01-11 LAB — CEA (IN HOUSE-CHCC): CEA (CHCC-IN HOUSE): 2.75 ng/mL (ref 0.00–5.00)

## 2018-01-11 NOTE — H&P (View-Only) (Signed)
[place] at [facility] Note: New Patient FIRST VISIT   Consult was requested by Dr. Paulo Fruit for a pelvic mass   Chief Complaint  Patient presents with  . Pelvic mass    GYN Oncologic Summary 1. TBD o .  HPI: Ms. ADRINNE Wright  is a very nice 57 y.o.  P2  June 2019 she had severe LUQ pain. She was sent by her PCP to the ER and workup for (what were found to be small) kidney stones with a renal CT noted an incidental adnexal mass. 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   The patient has never been told she had a fibroid until these events. Importantly she denies bleeding.  CA125 20.3 on 01/02/18 (normal for that lab) Last Pap 07/2017 uptodate  Since the June ER visit she has occasional pain and now also some lower back/hip pain. 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 She has cut out sugar and another item from her diet and lost 30 pounds over the last 6 months since doing so.  She had a colonoscopy last ~5 years ago and they did note polyps. She admits she is about due for another.  Notable is her history of nephrolithiasis which she states is mostly on the right. She was offered active treatment by urology for this, but declined with the new issue of the adnexal mass. She gets some N/V when the stones pass and notes hematuria. Notes some urinary frequency.  PCN allergy reviewed and sounds like it was a childhood rash; OK to give cephalosporin  Imported EPIC Oncologic History:   No history exists.    Measurement of disease: NA But preop CA125 normal at 20.3   Radiology: Dg  Abd 1 View  Result Date: 12/12/2017 CLINICAL DATA:  57 year old female with a history of left-sided flank pain and hematuria EXAM: ABDOMEN - 1 VIEW COMPARISON:  02/28/2013, 02/05/2013, CT 08/16/2017 FINDINGS: Gas within stomach, small bowel, colon.  No abnormal distention. No unexpected radiopaque foreign body. No unexpected soft tissue density. Calcifications project over the right renal silhouette. No calcifications are appreciated over the left renal silhouette. No acute displaced fracture. Degenerative changes of the bilateral hips. IMPRESSION: Nonobstructive bowel gas pattern. Small calcifications projecting over the right renal silhouette, likely right-sided nephrolithiasis. No left-sided nephrolithiasis appreciated. Electronically Signed   By: Corrie Mckusick D.O.   On: 12/12/2017 17:51   US Pelvic Complete With Transvaginal  Result Date: 12/30/2017 CLINICAL DATA:  Pelvic pain.  Gravida 2 para 2.  LMP in 2005. EXAM: TRANSABDOMINAL AND TRANSVAGINAL ULTRASOUND OF PELVIS TECHNIQUE: Both transabdominal and transvaginal ultrasound examinations of the pelvis were performed. Transabdominal technique was performed for global imaging of the pelvis including uterus, ovaries, adnexal regions, and pelvic cul-de-sac. It was necessary to proceed with endovaginal exam following the transabdominal exam to visualize the endometrium and adnexal regions. COMPARISON:  CT of the abdomen and pelvis 12/14/2017 FINDINGS: Uterus Measurements: 5.4 x 2.9 x 3.9 centimeters = volume: 61.80mL. No intrauterine abnormality identified. Endometrium Thickness: 2.1 millimeter.  No focal abnormality visualized.  Right ovary Measurements: No normal ovary identified. RIGHT adnexal mass is 8.7 x 6.7 x 6.8 centimeters, with volume of 396.4 milliliters. On Doppler evaluation there is no definitive internal blood flow within this lesion. The mass is contiguous with the uterus but appears to be separate from the uterus. Left ovary Measurements: 2.6  x 1.4 x 2.0 centimeters = volume: 7.55mL. Normal appearance/no adnexal mass. Other findings Trace free pelvic fluid. IMPRESSION: 1. 8.7 centimeter RIGHT pelvic mass, favored to be adnexal rather than uterine in origin. A normal RIGHT ovary is not identified and the findings are suspicious for solid RIGHT ovarian mass. Alternatively, the findings could be related to a broad ligament fibroid. Further characterization is needed. Malignancy needs to be excluded. Consider MRI and possible surgical evaluation. 2. Normal appearance of the uterus/endometrium and LEFT ovary. These results will be called to the ordering clinician or representative by the Radiologist Assistant, and communication documented in the PACS or zVision Dashboard. Electronically Signed   By: Nolon Nations M.D.   On: 12/30/2017 10:40   .  07/2017 - Renal CT study - Reproductive: Uterus is well visualized and within normal limits. Left ovary is unremarkable. In the right adnexa there is a soft tissue lesion which is incompletely characterized which measures approximately 6.7 x 5.6 cm. This was not seen on the prior examination.  Other: No abdominal wall hernia or abnormality. No abdominopelvic ascites.  Musculoskeletal: No acute or significant osseous findings.  IMPRESSION: Soft tissue mass in the right adnexa of uncertain etiology. This was not present on the prior exam from 2013. Further evaluation by means of nonemergent MR is recommended.  Outpatient Encounter Medications as of 01/11/2018  Medication Sig  . acetaminophen (TYLENOL) 500 MG tablet Take 500 mg by mouth every 6 (six) hours as needed for moderate pain.  . cholecalciferol (VITAMIN D) 1000 UNITS tablet Take 1,000 Units by mouth daily.   Marland Kitchen ketorolac (TORADOL) 10 MG tablet Take 1 tablet (10 mg total) by mouth every 6 (six) hours as needed. Must take with food, Do not take ibuprofen or aleve.  . ondansetron (ZOFRAN ODT) 4 MG disintegrating tablet Take 1 tablet (4 mg  total) by mouth every 8 (eight) hours as needed for nausea or vomiting.  . polyethylene glycol (MIRALAX / GLYCOLAX) packet Take 17 g by mouth daily as needed for mild constipation.   . [DISCONTINUED] tamsulosin (FLOMAX) 0.4 MG CAPS capsule Take 1 capsule (0.4 mg total) by mouth daily. (Patient not taking: Reported on 01/11/2018)   No facility-administered encounter medications on file as of 01/11/2018.    Allergies  Allergen Reactions  . Oxycodone Nausea And Vomiting  . Penicillins Rash    Childhood allergy Has patient had a PCN reaction causing immediate rash, facial/tongue/throat swelling, SOB or lightheadedness with hypotension: Yes Has patient had a PCN reaction causing severe rash involving mucus membranes or skin necrosis: No Has patient had a PCN reaction that required hospitalization: No Has patient had a PCN reaction occurring within the last 10 years: No If all of the above answers are "NO", then may proceed with Cephalosporin use.     Past Medical History:  Diagnosis Date  . Chronic kidney disease    kidney stones  . Kidney stones   . Tobacco abuse 03/22/2017   Past Surgical History:  Procedure Laterality Date  . back syugery  1993  . Benign tumor  1993  . BUNIONECTOMY Left 2006  . LITHOTRIPSY    . 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 No LMP recorded. Patient is postmenopausal. ~57 yo . Menarche: 57 years old . P 2 . Contraceptive Unknown . HRT none  . Last Pap negative with neg HRHPV Family Hx:  Family History  Problem Relation Age of Onset  . Congestive Heart Failure Mother   . Heart disease Mother   . Uterine cancer Mother   . Diabetes Father   . Heart disease Father   . Dementia Father   . Heart disease Sister   . Diabetes Sister    Social Hx:  Marland Kitchen Tobacco use: 1 pack/per day . Alcohol use: holidays only . Illicit Drug use: none . Illicit IV Drug use: none    Review of  Systems: Review of Systems  Constitutional: Positive for fatigue.  Respiratory: Positive for cough.   Gastrointestinal: Positive for abdominal pain and constipation.  Endocrine: Positive for hot flashes.  Musculoskeletal: Positive for arthralgias and back pain.  All other systems reviewed and are negative.  Vitals:  Vitals:   01/11/18 1101  BP: (!) 113/53  Pulse: 93  Resp: 20  Temp: 98.1 F (36.7 C)  SpO2: 97%   Vitals:   01/11/18 1101  Weight: 185 lb (83.9 kg)  Height: 5\' 9"  (1.753 m)   Body mass index is 27.32 kg/m.  Physical Exam: General :  Well developed, 56 y.o., female in no apparent distress HEENT:  Normocephalic/atraumatic, symmetric, EOMI, eyelids normal Neck:   Supple, no masses.  Lymphatics:  No cervical/ submandibular/ supraclavicular/ infraclavicular/ inguinal adenopathy Respiratory:  Respirations unlabored, no use of accessory muscles CV:   Deferred Breast:  Deferred Musculoskeletal: No CVA tenderness, normal muscle strength. Abdomen:  Soft, non-tender and nondistended. No evidence of hernia. No masses. Extremities:  No lymphedema, no erythema, non-tender. Skin:   Normal inspection Neuro/Psych:  No focal motor deficit, no abnormal mental status. Normal gait. Normal affect. Alert and oriented to person, place, and time  Genito Urinary: Vulva: Normal external female genitalia.  Bladder/urethra: Urethral meatus normal in size and location. No lesions or   masses, well supported bladder Speculum exam: Vagina: No lesion, no discharge, no bleeding. Cervix: Normal appearing, no lesions. Bimanual exam: Palpable fullness culdesac with irregular surface. Mobile off of the rectum Uterus: Normal size, mobile. Feels to be anterior to the process in the culdesac and possibly separate. Rectovaginal:  See above. Good tone, no cul de sac nodularity, no parametrial involvement or nodularity.   Assessment  Pelvic mass ECOG PERFORMANCE STATUS: 1 - Symptomatic but  completely ambulatory  Plan  1. Complexity of visit ? This is a new problem and additional workup is planned ? I want to check CEA and Inhibin ? Data reviewed ? I independently reviewed the images and the radiology reports and discussed my interpretation in the presence of the patient and her husband today ? In some images the mass looks solid, in others cystic.  ? In either case it is not a simple cyst. ? I reviewed her referring doctor's office notes and I have summarized in the HPI ? History was obtained from the patient and chart ? We reviewed the normal (CA125) tumor marker ? This is an new problem with uncertain prognosis ? I recommend major surgery ? She has no known comorbidities 2. We did discuss getting a CEA since she is due for colonoscopy and this is a "solid" pelvic mass 3. SCST comes to  mind in a menopausal patient with a solid adnexal mass so inhibin will be checked. 4. Management/Surgical discussion  ? Recommendation is for surgical resection ? We agreed on a plan for laparoscopic (robotic) BSO;  she does understand there is a risk regardless of the intent for minimally invasive procedure that laparotomy may be the final modality ? She understands the goal is mass resection (right adnexa) based on CT; and if the contralateral adnexa unresectable, since believed to be normal that we may leave it. ? We discussed if malignancy is suspected at the time of surgery that complete hysterectomy, BSO, and staging would be performed.  She understands this may be via laparotomy.  ? Surgical sketch was reviewed including the risk, benefits, alternatives of the procedure and a copy of the surgical sketch the end of the visit. ? We did discuss this might be a fibroid and that since she is not bleeding observation an option. She desires surgical removal (ie hysterectomy/BSO) if that is the case to avoid any issues in the future. ? We discussed the very low likelihood of a fibroid being  cancerous given she is having no PMB. 5. Preoperative items include the CEA and Inhibin 6. Return to clinic 10 to 14 days postop to review pathology and evaluate incisions   Mart Piggs, MD Gynecologic Oncologist 01/11/2018, 5:55 PM    Cc: Vania Rea, MD (Referring Ob/Gyn) Arnetha Courser, MD (PCP)

## 2018-01-11 NOTE — Progress Notes (Signed)
[place] at [facility] Note: New Patient FIRST VISIT   Consult was requested by Dr. Paulo Fruit for a pelvic mass   Chief Complaint  Patient presents with  . Pelvic mass    GYN Oncologic Summary 1. TBD o .  HPI: [name]  is a very nice 57 y.o.  P2  June 2019 she had severe LUQ pain. She was sent by her PCP to the ER and workup for (what were found to be small) kidney stones with a renal CT noted an incidental adnexal mass. 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   The patient has never been told she had a fibroid until these events. Importantly she denies bleeding.  CA125 20.3 on 01/02/18 (normal for that lab) Last Pap 07/2017 uptodate  Since the June ER visit she has occasional pain and now also some lower back/hip pain. 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 She has cut out sugar and another item from her diet and lost 30 pounds over the last 6 months since doing so.  She had a colonoscopy last ~5 years ago and they did note polyps. She admits she is about due for another.  Notable is her history of nephrolithiasis which she states is mostly on the right. She was offered active treatment by urology for this, but declined with the new issue of the adnexal mass. She gets some N/V when the stones pass and notes hematuria. Notes some urinary frequency.  PCN allergy reviewed and sounds like it was a childhood rash; OK to give cephalosporin  Imported EPIC Oncologic History:   No history exists.    Measurement of disease: NA But preop CA125 normal at 20.3   Radiology: Dg  Abd 1 View  Result Date: 12/12/2017 CLINICAL DATA:  57 year old female with a history of left-sided flank pain and hematuria EXAM: ABDOMEN - 1 VIEW COMPARISON:  02/28/2013, 02/05/2013, CT 08/16/2017 FINDINGS: Gas within stomach, small bowel, colon.  No abnormal distention. No unexpected radiopaque foreign body. No unexpected soft tissue density. Calcifications project over the right renal silhouette. No calcifications are appreciated over the left renal silhouette. No acute displaced fracture. Degenerative changes of the bilateral hips. IMPRESSION: Nonobstructive bowel gas pattern. Small calcifications projecting over the right renal silhouette, likely right-sided nephrolithiasis. No left-sided nephrolithiasis appreciated. Electronically Signed   By: Corrie Mckusick D.O.   On: 12/12/2017 17:51   US Pelvic Complete With Transvaginal  Result Date: 12/30/2017 CLINICAL DATA:  Pelvic pain.  Gravida 2 para 2.  LMP in 2005. EXAM: TRANSABDOMINAL AND TRANSVAGINAL ULTRASOUND OF PELVIS TECHNIQUE: Both transabdominal and transvaginal ultrasound examinations of the pelvis were performed. Transabdominal technique was performed for global imaging of the pelvis including uterus, ovaries, adnexal regions, and pelvic cul-de-sac. It was necessary to proceed with endovaginal exam following the transabdominal exam to visualize the endometrium and adnexal regions. COMPARISON:  CT of the abdomen and pelvis 12/14/2017 FINDINGS: Uterus Measurements: 5.4 x 2.9 x 3.9 centimeters = volume: 61.46mL. No intrauterine abnormality identified. Endometrium Thickness: 2.1 millimeter.  No focal abnormality visualized.  Right ovary Measurements: No normal ovary identified. RIGHT adnexal mass is 8.7 x 6.7 x 6.8 centimeters, with volume of 396.4 milliliters. On Doppler evaluation there is no definitive internal blood flow within this lesion. The mass is contiguous with the uterus but appears to be separate from the uterus. Left ovary Measurements: 2.6  x 1.4 x 2.0 centimeters = volume: 7.39mL. Normal appearance/no adnexal mass. Other findings Trace free pelvic fluid. IMPRESSION: 1. 8.7 centimeter RIGHT pelvic mass, favored to be adnexal rather than uterine in origin. A normal RIGHT ovary is not identified and the findings are suspicious for solid RIGHT ovarian mass. Alternatively, the findings could be related to a broad ligament fibroid. Further characterization is needed. Malignancy needs to be excluded. Consider MRI and possible surgical evaluation. 2. Normal appearance of the uterus/endometrium and LEFT ovary. These results will be called to the ordering clinician or representative by the Radiologist Assistant, and communication documented in the PACS or zVision Dashboard. Electronically Signed   By: Nolon Nations M.D.   On: 12/30/2017 10:40   .  07/2017 - Renal CT study - Reproductive: Uterus is well visualized and within normal limits. Left ovary is unremarkable. In the right adnexa there is a soft tissue lesion which is incompletely characterized which measures approximately 6.7 x 5.6 cm. This was not seen on the prior examination.  Other: No abdominal wall hernia or abnormality. No abdominopelvic ascites.  Musculoskeletal: No acute or significant osseous findings.  IMPRESSION: Soft tissue mass in the right adnexa of uncertain etiology. This was not present on the prior exam from 2013. Further evaluation by means of nonemergent MR is recommended.  Outpatient Encounter Medications as of 01/11/2018  Medication Sig  . acetaminophen (TYLENOL) 500 MG tablet Take 500 mg by mouth every 6 (six) hours as needed for moderate pain.  . cholecalciferol (VITAMIN D) 1000 UNITS tablet Take 1,000 Units by mouth daily.   Marland Kitchen ketorolac (TORADOL) 10 MG tablet Take 1 tablet (10 mg total) by mouth every 6 (six) hours as needed. Must take with food, Do not take ibuprofen or aleve.  . ondansetron (ZOFRAN ODT) 4 MG disintegrating tablet Take 1 tablet (4 mg  total) by mouth every 8 (eight) hours as needed for nausea or vomiting.  . polyethylene glycol (MIRALAX / GLYCOLAX) packet Take 17 g by mouth daily as needed for mild constipation.   . [DISCONTINUED] tamsulosin (FLOMAX) 0.4 MG CAPS capsule Take 1 capsule (0.4 mg total) by mouth daily. (Patient not taking: Reported on 01/11/2018)   No facility-administered encounter medications on file as of 01/11/2018.    Allergies  Allergen Reactions  . Oxycodone Nausea And Vomiting  . Penicillins Rash    Childhood allergy Has patient had a PCN reaction causing immediate rash, facial/tongue/throat swelling, SOB or lightheadedness with hypotension: Yes Has patient had a PCN reaction causing severe rash involving mucus membranes or skin necrosis: No Has patient had a PCN reaction that required hospitalization: No Has patient had a PCN reaction occurring within the last 10 years: No If all of the above answers are "NO", then may proceed with Cephalosporin use.     Past Medical History:  Diagnosis Date  . Chronic kidney disease    kidney stones  . Kidney stones   . Tobacco abuse 03/22/2017   Past Surgical History:  Procedure Laterality Date  . back syugery  1993  . Benign tumor  1993  . BUNIONECTOMY Left 2006  . LITHOTRIPSY    . 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 No LMP recorded. Patient is postmenopausal. ~57 yo . Menarche: 57 years old . P 2 . Contraceptive Unknown . HRT none  . Last Pap negative with neg HRHPV Family Hx:  Family History  Problem Relation Age of Onset  . Congestive Heart Failure Mother   . Heart disease Mother   . Uterine cancer Mother   . Diabetes Father   . Heart disease Father   . Dementia Father   . Heart disease Sister   . Diabetes Sister    Social Hx:  Marland Kitchen Tobacco use: 1 pack/per day . Alcohol use: holidays only . Illicit Drug use: none . Illicit IV Drug use: none    Review of  Systems: Review of Systems  Constitutional: Positive for fatigue.  Respiratory: Positive for cough.   Gastrointestinal: Positive for abdominal pain and constipation.  Endocrine: Positive for hot flashes.  Musculoskeletal: Positive for arthralgias and back pain.  All other systems reviewed and are negative.  Vitals:  Vitals:   01/11/18 1101  BP: (!) 113/53  Pulse: 93  Resp: 20  Temp: 98.1 F (36.7 C)  SpO2: 97%   Vitals:   01/11/18 1101  Weight: 185 lb (83.9 kg)  Height: 5\' 9"  (1.753 m)   Body mass index is 27.32 kg/m.  Physical Exam: General :  Well developed, 57 y.o., female in no apparent distress HEENT:  Normocephalic/atraumatic, symmetric, EOMI, eyelids normal Neck:   Supple, no masses.  Lymphatics:  No cervical/ submandibular/ supraclavicular/ infraclavicular/ inguinal adenopathy Respiratory:  Respirations unlabored, no use of accessory muscles CV:   Deferred Breast:  Deferred Musculoskeletal: No CVA tenderness, normal muscle strength. Abdomen:  Soft, non-tender and nondistended. No evidence of hernia. No masses. Extremities:  No lymphedema, no erythema, non-tender. Skin:   Normal inspection Neuro/Psych:  No focal motor deficit, no abnormal mental status. Normal gait. Normal affect. Alert and oriented to person, place, and time  Genito Urinary: Vulva: Normal external female genitalia.  Bladder/urethra: Urethral meatus normal in size and location. No lesions or   masses, well supported bladder Speculum exam: Vagina: No lesion, no discharge, no bleeding. Cervix: Normal appearing, no lesions. Bimanual exam: Palpable fullness culdesac with irregular surface. Mobile off of the rectum Uterus: Normal size, mobile. Feels to be anterior to the process in the culdesac and possibly separate. Rectovaginal:  See above. Good tone, no cul de sac nodularity, no parametrial involvement or nodularity.   Assessment  Pelvic mass ECOG PERFORMANCE STATUS: 1 - Symptomatic but  completely ambulatory  Plan  1. Complexity of visit ? This is a new problem and additional workup is planned ? I want to check CEA and Inhibin ? Data reviewed ? I independently reviewed the images and the radiology reports and discussed my interpretation in the presence of the patient and her husband today ? In some images the mass looks solid, in others cystic.  ? In either case it is not a simple cyst. ? I reviewed her referring doctor's office notes and I have summarized in the HPI ? History was obtained from the patient and chart ? We reviewed the normal (CA125) tumor marker ? This is an new problem with uncertain prognosis ? I recommend major surgery ? She has no known comorbidities 2. We did discuss getting a CEA since she is due for colonoscopy and this is a "solid" pelvic mass 3. SCST comes to  mind in a menopausal patient with a solid adnexal mass so inhibin will be checked. 4. Management/Surgical discussion  ? Recommendation is for surgical resection ? We agreed on a plan for laparoscopic (robotic) BSO;  she does understand there is a risk regardless of the intent for minimally invasive procedure that laparotomy may be the final modality ? She understands the goal is mass resection (right adnexa) based on CT; and if the contralateral adnexa unresectable, since believed to be normal that we may leave it. ? We discussed if malignancy is suspected at the time of surgery that complete hysterectomy, BSO, and staging would be performed.  She understands this may be via laparotomy.  ? Surgical sketch was reviewed including the risk, benefits, alternatives of the procedure and a copy of the surgical sketch the end of the visit. ? We did discuss this might be a fibroid and that since she is not bleeding observation an option. She desires surgical removal (ie hysterectomy/BSO) if that is the case to avoid any issues in the future. ? We discussed the very low likelihood of a fibroid being  cancerous given she is having no PMB. 5. Preoperative items include the CEA and Inhibin 6. Return to clinic 10 to 14 days postop to review pathology and evaluate incisions   Mart Piggs, MD Gynecologic Oncologist 01/11/2018, 5:55 PM    Cc: Vania Rea, MD (Referring Ob/Gyn) Arnetha Courser, MD (PCP)

## 2018-01-11 NOTE — Patient Instructions (Signed)
Preparing for your Surgery  Plan for surgery on February 06, 2018 with Dr. Precious Wright at [facility] will be scheduled for a robotic assisted laparoscopic bilateral salpingo-oophorectomy, possible mini-laparotomy, possible staging.   Pre-operative Testing -You will receive a phone call from presurgical testing at [facility] to arrange for a pre-operative testing appointment before your surgery.  This appointment normally occurs one to two weeks before your scheduled surgery.   -Bring your insurance card, copy of an advanced directive if applicable, medication list  -At that visit, you will be asked to sign a consent for a possible blood transfusion in case a transfusion becomes necessary during surgery.  The need for a blood transfusion is rare but having consent is a necessary part of your care.     -You should not be taking blood thinners or aspirin at least ten days prior to surgery unless instructed by your surgeon.  Day Before Surgery at [place] will be asked to take in a light diet the day before surgery.  Avoid carbonated beverages.  You will be advised to have nothing to eat or drink after midnight the evening before.    Eat a light diet the day before surgery.  Examples including soups, broths, toast, yogurt, mashed potatoes.  Things to avoid include carbonated beverages (fizzy beverages), raw fruits and raw vegetables, or beans.   If your bowels are filled with gas, your surgeon will have difficulty visualizing your pelvic organs which increases your surgical risks.  Your role in recovery Your role is to become active as soon as directed by your doctor, while still giving yourself time to heal.  Rest when you feel tired. You will be asked to do the following in order to speed your recovery:  - Cough and breathe deeply. This helps toclear and expand your lungs and can prevent pneumonia. You may be given a spirometer to practice deep breathing.  A staff member will show you how to use the spirometer. - Do mild physical activity. Walking or moving your legs help your circulation and body functions return to normal. A staff member will help you when you try to walk and will provide you with simple exercises. Do not try to get up or walk alone the first time. - Actively manage your pain. Managing your pain lets you move in comfort. We will ask you to rate your pain on a scale of zero to 10. It is your responsibility to tell your doctor or nurse where and how much you hurt so your pain can be treated.  Special Considerations -If you are diabetic, you may be placed on insulin after surgery to have closer control over your blood sugars to promote healing and recovery.  This does not mean that you will be discharged on insulin.  If applicable, your oral antidiabetics will be resumed when you are tolerating a solid diet.  -Your final pathology results from surgery should be available around one week after surgery and the results will be relayed to you when available.  -Dr. Everitt Amber is the Surgeon that assists your GYN Oncologist with surgery.  The next day after your surgery you will either see your GYN Oncologist, Dr. Everitt Amber, or Dr. Lahoma Crocker.  -FMLA forms can be faxed to [phone] and please allow 5-7 business days for completion.   Blood Transfusion Information WHAT IS A BLOOD TRANSFUSION? A transfusion is the replacement of blood or some of its parts. Blood is made up  of multiple cells which provide different functions.  Red blood cells carry oxygen and are used for blood loss replacement.  White blood cells fight against infection.  Platelets control bleeding.  Plasma helps clot blood.  Other blood products are available for specialized needs, such as hemophilia or other clotting disorders. BEFORE THE TRANSFUSION  Who gives blood for transfusions?   You may be able to donate blood to be used at a later date on  yourself (autologous donation).  Relatives can be asked to donate blood. This is generally not any safer than if you have received blood from a stranger. The same precautions are taken to ensure safety when a relative's blood is donated.  Healthy volunteers who are fully evaluated to make sure their blood is safe. This is blood bank blood. Transfusion therapy is the safest it has ever been in the practice of medicine. Before blood is taken from a donor, a complete history is taken to make sure that person has no history of diseases nor engages in risky social behavior (examples are intravenous drug use or sexual activity with multiple partners). The donor's travel history is screened to minimize risk of transmitting infections, such as malaria. The donated blood is tested for signs of infectious diseases, such as HIV and hepatitis. The blood is then tested to be sure it is compatible with you in order to minimize the chance of a transfusion reaction. If you or a relative donates blood, this is often done in anticipation of surgery and is not appropriate for emergency situations. It takes many days to process the donated blood. RISKS AND COMPLICATIONS Although transfusion therapy is very safe and saves many lives, the main dangers of transfusion include:   Getting an infectious disease.  Developing a transfusion reaction. This is an allergic reaction to something in the blood you were given. Every precaution is taken to prevent this. The decision to have a blood transfusion has been considered carefully by your caregiver before blood is given. Blood is not given unless the benefits outweigh the risks.

## 2018-01-12 LAB — INHIBIN B

## 2018-01-13 ENCOUNTER — Telehealth: Payer: Self-pay

## 2018-01-13 NOTE — Telephone Encounter (Signed)
Told [name] that her tumor markers from 01-11-18 were all with in  Normal limits per Joylene John, NP.

## 2018-01-30 NOTE — Patient Instructions (Addendum)
Sharon Wright  01/30/2018   Your procedure is scheduled on: 02-06-18  Report to [place]  Entrance  Report to admitting at 1140 AM    Call this number if you have problems the morning of surgery [phone]   Remember: . BRUSH YOUR TEETH MORNING OF SURGERY AND RINSE YOUR MOUTH OUT, NO CHEWING GUM CANDY OR MINTS.  NO SOLID FOOD AFTER MIDNIGHT THE NIGHT PRIOR TO SURGERY. NOTHING BY MOUTH EXCEPT CLEAR LIQUIDS UNTIL 3 HOURS PRIOR TO [place] SURGERY. PLEASE FINISH ENSURE DRINK PER SURGEON ORDER 3 HOURS PRIOR TO SCHEDULED SURGERY TIME WHICH NEEDS TO BE COMPLETED AT 1040 am.    CLEAR LIQUID DIET   Foods Allowed                                                                     Foods Excluded  Coffee and tea, regular and decaf                             liquids that you cannot  Plain Jell-O in any flavor                                             see through such as: Fruit ices (not with fruit pulp)                                     milk, soups, orange juice  Iced Popsicles                                    All solid food                                     Cranberry, grape and apple juices Sports drinks like Gatorade Lightly seasoned clear broth or consume(fat free) Sugar, honey syrup  Sample Menu Breakfast                                Lunch                                     Supper Cranberry juice                    Beef broth                            Chicken broth Jell-O  Grape juice                           Apple juice Coffee or tea                        Jell-O                                      Popsicle                                                Coffee or tea                        Coffee or tea  _____________________________________________________________________ Eat a light diet the day before surgery on 02-05-18  Examples including soups, broths, toast, yogurt, mashed potatoes.  Things to  avoid include carbonated beverages (fizzy beverages), raw fruits and raw vegetables, or beans.   If your bowels are filled with gas, your surgeon will have difficulty visualizing your pelvic organs which increases your surgical risks.   Take these medicines the morning of surgery with A SIP OF WATER: none                                You may not have any metal on your body including hair pins and              piercings  Do not wear jewelry, make-up, lotions, powders or perfumes, deodorant             Do not wear nail polish.  Do not shave  48 hours prior to surgery.              Men may shave face and neck.   Do not bring valuables to the hospital. [place].  Contacts, dentures or bridgework may not be worn into surgery.  Leave suitcase in the car. After surgery it may be brought to your room.     Patients discharged the day of surgery will not be allowed to drive home.  Name and phone number of your driver: roy spouse cell [phone]  Special Instructions: N/A              Please read over the following fact sheets you were given: _____________________________________________________________________  [facility] - Preparing for Surgery Before surgery, you can play an important role.  Because skin is not sterile, your skin needs to be as free of germs as possible.  You can reduce the number of germs on your skin by washing with CHG (chlorahexidine gluconate) soap before surgery.  CHG is an antiseptic cleaner which kills germs and bonds with the skin to continue killing germs even after washing. Please DO NOT use if you have an allergy to CHG or antibacterial soaps.  If your skin becomes reddened/irritated stop using the CHG and inform your nurse when you arrive at Short Stay. Do not shave (including legs and underarms) for at least 48 hours prior to the first CHG shower.  You may shave your face/neck. Please follow these instructions  carefully:  1.  Shower with CHG Soap the night before surgery and the  morning of Surgery.  2.  If you choose to wash your hair, wash your hair first as usual with your  normal  shampoo.  3.  After you shampoo, rinse your hair and body thoroughly to remove the  shampoo.                           4.  Use CHG as you would any other liquid soap.  You can apply chg directly  to the skin and wash                       Gently with a scrungie or clean washcloth.  5.  Apply the CHG Soap to your body ONLY FROM THE NECK DOWN.   Do not use on face/ open                           Wound or open sores. Avoid contact with eyes, ears mouth and genitals (private parts).                       Wash face,  Genitals (private parts) with your normal soap.             6.  Wash thoroughly, paying special attention to the area where your surgery  will be performed.  7.  Thoroughly rinse your body with warm water from the neck down.  8.  DO NOT shower/wash with your normal soap after using and rinsing off  the CHG Soap.                9.  Pat yourself dry with a clean towel.            10.  Wear clean pajamas.            11.  Place clean sheets on your bed the night of your first shower and do not  sleep with pets. Day of Surgery : Do not apply any lotions/deodorants the morning of surgery.  Please wear clean clothes to the hospital/surgery center.  FAILURE TO FOLLOW THESE INSTRUCTIONS MAY RESULT IN THE CANCELLATION OF YOUR SURGERY PATIENT SIGNATURE_________________________________  NURSE SIGNATURE__________________________________  ________________________________________________________________________   Adam Phenix  An incentive spirometer is a tool that can help keep your lungs clear and active. This tool measures how well you are filling your lungs with each breath. Taking long deep breaths may help reverse or decrease the chance of developing breathing (pulmonary) problems (especially infection)  following:  A long period of time when you are unable to move or be active. BEFORE THE PROCEDURE   If the spirometer includes an indicator to show your best effort, your nurse or respiratory therapist will set it to a desired goal.  If possible, sit up straight or lean slightly forward. Try not to slouch.  Hold the incentive spirometer in an upright position. INSTRUCTIONS FOR USE  1. Sit on the edge of your bed if possible, or sit up as far as you can in bed or on a chair. 2. Hold the incentive spirometer in an upright position. 3. Breathe out normally. 4. Place the mouthpiece in your mouth and seal your lips tightly around it. 5. Breathe in slowly and as deeply as possible,  raising the piston or the ball toward the top of the column. 6. Hold your breath for 3-5 seconds or for as long as possible. Allow the piston or ball to fall to the bottom of the column. 7. Remove the mouthpiece from your mouth and breathe out normally. 8. Rest for a few seconds and repeat Steps 1 through 7 at least 10 times every 1-2 hours when you are awake. Take your time and take a few normal breaths between deep breaths. 9. The spirometer may include an indicator to show your best effort. Use the indicator as a goal to work toward during each repetition. 10. After each set of 10 deep breaths, practice coughing to be sure your lungs are clear. If you have an incision (the cut made at the time of surgery), support your incision when coughing by placing a pillow or rolled up towels firmly against it. Once you are able to get out of bed, walk around indoors and cough well. You may stop using the incentive spirometer when instructed by your caregiver.  RISKS AND COMPLICATIONS  Take your time so you do not get dizzy or light-headed.  If you are in pain, you may need to take or ask for pain medication before doing incentive spirometry. It is harder to take a deep breath if you are having pain. AFTER USE  Rest and  breathe slowly and easily.  It can be helpful to keep track of a log of your progress. Your caregiver can provide you with a simple table to help with this. If you are using the spirometer at home, follow these instructions: [place] IF:   You are having difficultly using the spirometer.  You have trouble using the spirometer as often as instructed.  Your pain medication is not giving enough relief while using the spirometer.  You develop fever of 100.5 F (38.1 C) or higher. SEEK IMMEDIATE MEDICAL CARE IF:   You cough up bloody sputum that had not been present before.  You develop fever of 102 F (38.9 C) or greater.  You develop worsening pain at or near the incision site. MAKE SURE YOU:   Understand these instructions.  Will watch your condition.  Will get help right away if you are not doing well or get worse. Document Released: 06/28/2006 Document Revised: 05/10/2011 Document Reviewed: 08/29/2006 Exit[employer] Patient Information 2014 [employer], [place].   ________________________________________________________________________  WHAT IS A BLOOD TRANSFUSION? Blood Transfusion Information  A transfusion is the replacement of blood or some of its parts. Blood is made up of multiple cells which provide different functions.  Red blood cells carry oxygen and are used for blood loss replacement.  White blood cells fight against infection.  Platelets control bleeding.  Plasma helps clot blood.  Other blood products are available for specialized needs, such as hemophilia or other clotting disorders. BEFORE THE TRANSFUSION  Who gives blood for transfusions?   Healthy volunteers who are fully evaluated to make sure their blood is safe. This is blood bank blood. Transfusion therapy is the safest it has ever been in the practice of medicine. Before blood is taken from a donor, a complete history is taken to make sure that person has no history of diseases nor engages in  risky social behavior (examples are intravenous drug use or sexual activity with multiple partners). The donor's travel history is screened to minimize risk of transmitting infections, such as malaria. The donated blood is tested for signs of infectious diseases, such as  HIV and hepatitis. The blood is then tested to be sure it is compatible with you in order to minimize the chance of a transfusion reaction. If you or a relative donates blood, this is often done in anticipation of surgery and is not appropriate for emergency situations. It takes many days to process the donated blood. RISKS AND COMPLICATIONS Although transfusion therapy is very safe and saves many lives, the main dangers of transfusion include:   Getting an infectious disease.  Developing a transfusion reaction. This is an allergic reaction to something in the blood you were given. Every precaution is taken to prevent this. The decision to have a blood transfusion has been considered carefully by your caregiver before blood is given. Blood is not given unless the benefits outweigh the risks. AFTER THE TRANSFUSION  Right after receiving a blood transfusion, you will usually feel much better and more energetic. This is especially true if your red blood cells have gotten low (anemic). The transfusion raises the level of the red blood cells which carry oxygen, and this usually causes an energy increase.  The nurse administering the transfusion will monitor you carefully for complications. HOME CARE INSTRUCTIONS  No special instructions are needed after a transfusion. You may find your energy is better. Speak with your caregiver about any limitations on activity for underlying diseases you may have. SEEK MEDICAL CARE IF:   Your condition is not improving after your transfusion.  You develop redness or irritation at the intravenous (IV) site. SEEK IMMEDIATE MEDICAL CARE IF:  Any of the following symptoms occur over the next 12  hours:  Shaking chills.  You have a temperature by mouth above 102 F (38.9 C), not controlled by medicine.  Chest, back, or muscle pain.  People around you feel you are not acting correctly or are confused.  Shortness of breath or difficulty breathing.  Dizziness and fainting.  You get a rash or develop hives.  You have a decrease in urine output.  Your urine turns a dark color or changes to pink, red, or brown. Any of the following symptoms occur over the next 10 days:  You have a temperature by mouth above 102 F (38.9 C), not controlled by medicine.  Shortness of breath.  Weakness after normal activity.  The white part of the eye turns yellow (jaundice).  You have a decrease in the amount of urine or are urinating less often.  Your urine turns a dark color or changes to pink, red, or brown. Document Released: 02/13/2000 Document Revised: 05/10/2011 Document Reviewed: 10/02/2007 [facility] Patient Information 2014 [place], [place].  _______________________________________________________________________

## 2018-01-30 NOTE — Progress Notes (Signed)
ekg 08-16-17 epic Chest xray and chest ct 08-16-17 epic

## 2018-02-01 ENCOUNTER — Other Ambulatory Visit: Payer: Self-pay

## 2018-02-01 ENCOUNTER — Encounter (HOSPITAL_COMMUNITY): Payer: Self-pay

## 2018-02-01 ENCOUNTER — Encounter (HOSPITAL_COMMUNITY)
Admission: RE | Admit: 2018-02-01 | Discharge: 2018-02-01 | Disposition: A | Discharge status: Home / Self Care | Payer: BLUE CROSS/BLUE SHIELD | Source: Ambulatory Visit | Attending: Obstetrics | Admitting: Obstetrics

## 2018-02-01 DIAGNOSIS — Z01812 Encounter for preprocedural laboratory examination: Secondary | ICD-10-CM | POA: Insufficient documentation

## 2018-02-01 DIAGNOSIS — R1909 Other intra-abdominal and pelvic swelling, mass and lump: Secondary | ICD-10-CM | POA: Diagnosis not present

## 2018-02-01 DIAGNOSIS — Z880 Allergy status to penicillin: Secondary | ICD-10-CM | POA: Diagnosis not present

## 2018-02-01 DIAGNOSIS — R1900 Intra-abdominal and pelvic swelling, mass and lump, unspecified site: Secondary | ICD-10-CM | POA: Diagnosis not present

## 2018-02-01 DIAGNOSIS — N189 Chronic kidney disease, unspecified: Secondary | ICD-10-CM | POA: Diagnosis not present

## 2018-02-01 DIAGNOSIS — Z87442 Personal history of urinary calculi: Secondary | ICD-10-CM | POA: Diagnosis not present

## 2018-02-01 DIAGNOSIS — K5909 Other constipation: Secondary | ICD-10-CM | POA: Diagnosis not present

## 2018-02-01 DIAGNOSIS — D270 Benign neoplasm of right ovary: Secondary | ICD-10-CM | POA: Diagnosis not present

## 2018-02-01 DIAGNOSIS — Z885 Allergy status to narcotic agent status: Secondary | ICD-10-CM | POA: Diagnosis not present

## 2018-02-01 DIAGNOSIS — F17210 Nicotine dependence, cigarettes, uncomplicated: Secondary | ICD-10-CM | POA: Diagnosis not present

## 2018-02-01 DIAGNOSIS — Z79899 Other long term (current) drug therapy: Secondary | ICD-10-CM | POA: Diagnosis not present

## 2018-02-01 HISTORY — DX: Other microscopic hematuria: R31.29

## 2018-02-01 HISTORY — DX: Personal history of urinary calculi: Z87.442

## 2018-02-01 HISTORY — DX: Intra-abdominal and pelvic swelling, mass and lump, unspecified site: R19.00

## 2018-02-01 LAB — CBC
HEMATOCRIT: 47.2 % — AB (ref 36.0–46.0)
HEMOGLOBIN: 15.6 g/dL — AB (ref 12.0–15.0)
MCH: 30.8 pg (ref 26.0–34.0)
MCHC: 33.1 g/dL (ref 30.0–36.0)
MCV: 93.3 fL (ref 80.0–100.0)
Platelets: 234 10*3/uL (ref 150–400)
RBC: 5.06 MIL/uL (ref 3.87–5.11)
RDW: 14.6 % (ref 11.5–15.5)
WBC: 11.2 10*3/uL — ABNORMAL HIGH (ref 4.0–10.5)
nRBC: 0 % (ref 0.0–0.2)

## 2018-02-01 LAB — URINALYSIS, ROUTINE W REFLEX MICROSCOPIC
Bilirubin Urine: NEGATIVE
Glucose, UA: NEGATIVE mg/dL
KETONES UR: 20 mg/dL — AB
Leukocytes, UA: NEGATIVE
Nitrite: NEGATIVE
Protein, ur: 30 mg/dL — AB
RBC / HPF: >50 RBC/hpf — ABNORMAL HIGH (ref 0–5)
Specific Gravity, Urine: 1.02 (ref 1.005–1.030)
pH: 5 (ref 5.0–8.0)

## 2018-02-01 LAB — COMPREHENSIVE METABOLIC PANEL
ALT: 24 U/L (ref 0–44)
AST: 23 U/L (ref 15–41)
Albumin: 4.3 g/dL (ref 3.5–5.0)
Alkaline Phosphatase: 65 U/L (ref 38–126)
Anion gap: 8 (ref 5–15)
BUN: 29 mg/dL — ABNORMAL HIGH (ref 6–20)
CO2: 27 mmol/L (ref 22–32)
Calcium: 9.7 mg/dL (ref 8.9–10.3)
Chloride: 105 mmol/L (ref 98–111)
Creatinine, Ser: 0.63 mg/dL (ref 0.44–1.00)
GFR calc Af Amer: >60 mL/min (ref >60)
GFR calc non Af Amer: >60 mL/min (ref >60)
Glucose, Bld: 90 mg/dL (ref 70–99)
Potassium: 4.3 mmol/L (ref 3.5–5.1)
Sodium: 140 mmol/L (ref 135–145)
Total Bilirubin: 0.5 mg/dL (ref 0.3–1.2)
Total Protein: 7 g/dL (ref 6.5–8.1)

## 2018-02-01 NOTE — Progress Notes (Signed)
CMP done 02-01-18 routed to Dr. Gerarda Fraction via epic

## 2018-02-02 LAB — ABO/RH: ABO/RH(D): B POS

## 2018-02-02 NOTE — Progress Notes (Signed)
UA done 02-01-18 routed to Dr. Gerarda Fraction via epic

## 2018-02-03 ENCOUNTER — Other Ambulatory Visit: Payer: Self-pay | Admitting: Gynecologic Oncology

## 2018-02-03 ENCOUNTER — Telehealth: Payer: Self-pay | Admitting: Oncology

## 2018-02-03 DIAGNOSIS — N390 Urinary tract infection, site not specified: Secondary | ICD-10-CM

## 2018-02-03 MED ORDER — NITROFURANTOIN MONOHYD MACRO 100 MG PO CAPS: 100.0000 mg | ORAL_CAPSULE | Freq: Two times a day (BID) | ORAL | 0 refills | Status: DC

## 2018-02-03 NOTE — Progress Notes (Signed)
Plan to begin Macrobid twice daily for 5 days due to UTI noted on pre-op culture.  Awaiting organism and sensitivities. Dr. Gerarda Fraction would like to proceed with Macrobid prior to her surgery on Monday.

## 2018-02-03 NOTE — Telephone Encounter (Signed)
Called Deneisha and advised her that a prescription for macrobid has been sent to [facility] in [place], [place].  Discussed that this will not change surgery for Monday and to take her last dose Sunday night.  She verbalized understanding and agreement.

## 2018-02-04 LAB — URINE CULTURE: Culture: >=100000 — AB

## 2018-02-06 ENCOUNTER — Ambulatory Visit (HOSPITAL_BASED_OUTPATIENT_CLINIC_OR_DEPARTMENT_OTHER)
Admission: RE | Admit: 2018-02-06 | Discharge: 2018-02-06 | Disposition: A | Discharge status: Home / Self Care | Payer: BLUE CROSS/BLUE SHIELD | Source: Ambulatory Visit | Attending: Obstetrics | Admitting: Obstetrics

## 2018-02-06 ENCOUNTER — Ambulatory Visit (HOSPITAL_BASED_OUTPATIENT_CLINIC_OR_DEPARTMENT_OTHER): Payer: BLUE CROSS/BLUE SHIELD | Admitting: Certified Registered"

## 2018-02-06 ENCOUNTER — Encounter (HOSPITAL_COMMUNITY): Payer: Self-pay | Admitting: *Deleted

## 2018-02-06 ENCOUNTER — Encounter (HOSPITAL_COMMUNITY)
Admission: RE | Disposition: A | Discharge status: Home / Self Care | Payer: Self-pay | Source: Ambulatory Visit | Attending: Obstetrics

## 2018-02-06 DIAGNOSIS — K5909 Other constipation: Secondary | ICD-10-CM | POA: Insufficient documentation

## 2018-02-06 DIAGNOSIS — D270 Benign neoplasm of right ovary: Secondary | ICD-10-CM | POA: Diagnosis not present

## 2018-02-06 DIAGNOSIS — F17210 Nicotine dependence, cigarettes, uncomplicated: Secondary | ICD-10-CM | POA: Diagnosis not present

## 2018-02-06 DIAGNOSIS — Z885 Allergy status to narcotic agent status: Secondary | ICD-10-CM | POA: Insufficient documentation

## 2018-02-06 DIAGNOSIS — N189 Chronic kidney disease, unspecified: Secondary | ICD-10-CM | POA: Diagnosis not present

## 2018-02-06 DIAGNOSIS — Z79899 Other long term (current) drug therapy: Secondary | ICD-10-CM | POA: Diagnosis not present

## 2018-02-06 DIAGNOSIS — R1900 Intra-abdominal and pelvic swelling, mass and lump, unspecified site: Secondary | ICD-10-CM

## 2018-02-06 DIAGNOSIS — Z880 Allergy status to penicillin: Secondary | ICD-10-CM | POA: Diagnosis not present

## 2018-02-06 DIAGNOSIS — R8569 Abnormal cytological findings in specimens from other digestive organs and abdominal cavity: Secondary | ICD-10-CM | POA: Diagnosis not present

## 2018-02-06 DIAGNOSIS — Z87442 Personal history of urinary calculi: Secondary | ICD-10-CM | POA: Insufficient documentation

## 2018-02-06 DIAGNOSIS — R1909 Other intra-abdominal and pelvic swelling, mass and lump: Secondary | ICD-10-CM | POA: Diagnosis not present

## 2018-02-06 HISTORY — PX: ROBOTIC ASSISTED BILATERAL SALPINGO OOPHERECTOMY: SHX6078

## 2018-02-06 LAB — TYPE AND SCREEN
ABO/RH(D): B POS
ANTIBODY SCREEN: NEGATIVE

## 2018-02-06 SURGERY — SALPINGO-OOPHORECTOMY, BILATERAL, ROBOT-ASSISTED
Anesthesia: General | Laterality: Bilateral

## 2018-02-06 MED ORDER — FENTANYL CITRATE (PF) 250 MCG/5ML IJ SOLN
INTRAMUSCULAR | Status: AC
  Filled 2018-02-06: qty 5

## 2018-02-06 MED ORDER — ROCURONIUM BROMIDE 10 MG/ML (PF) SYRINGE
PREFILLED_SYRINGE | INTRAVENOUS | Status: AC
  Filled 2018-02-06: qty 10

## 2018-02-06 MED ORDER — IBUPROFEN 800 MG PO TABS: 800.0000 mg | ORAL_TABLET | Freq: Three times a day (TID) | ORAL | 0 refills | Status: DC | PRN

## 2018-02-06 MED ORDER — LIDOCAINE 2% (20 MG/ML) 5 ML SYRINGE
INTRAMUSCULAR | Status: DC | PRN
  Administered 2018-02-06: 100 mg via INTRAVENOUS

## 2018-02-06 MED ORDER — ONDANSETRON HCL 4 MG/2ML IJ SOLN
INTRAMUSCULAR | Status: DC | PRN
  Administered 2018-02-06: 4 mg via INTRAVENOUS

## 2018-02-06 MED ORDER — ROCURONIUM BROMIDE 50 MG/5ML IV SOSY
PREFILLED_SYRINGE | INTRAVENOUS | Status: DC | PRN
  Administered 2018-02-06: 20 mg via INTRAVENOUS
  Administered 2018-02-06: 50 mg via INTRAVENOUS
  Administered 2018-02-06: 20 mg via INTRAVENOUS

## 2018-02-06 MED ORDER — PROMETHAZINE HCL 25 MG/ML IJ SOLN: 6.2500 mg | INTRAMUSCULAR | Status: DC | PRN

## 2018-02-06 MED ORDER — DEXAMETHASONE SODIUM PHOSPHATE 10 MG/ML IJ SOLN
INTRAMUSCULAR | Status: DC | PRN
  Administered 2018-02-06: 4 mg via INTRAVENOUS

## 2018-02-06 MED ORDER — PROPOFOL 10 MG/ML IV BOLUS
INTRAVENOUS | Status: DC | PRN
  Administered 2018-02-06: 150 mg via INTRAVENOUS

## 2018-02-06 MED ORDER — SUGAMMADEX SODIUM 200 MG/2ML IV SOLN
INTRAVENOUS | Status: DC | PRN
  Administered 2018-02-06: 200 mg via INTRAVENOUS

## 2018-02-06 MED ORDER — CEFAZOLIN SODIUM-DEXTROSE 2-4 GM/100ML-% IV SOLN
2.0000 g | INTRAVENOUS | Status: AC
  Administered 2018-02-06: 2 g via INTRAVENOUS
  Filled 2018-02-06: qty 100

## 2018-02-06 MED ORDER — FENTANYL CITRATE (PF) 100 MCG/2ML IJ SOLN
25.0000 ug | INTRAMUSCULAR | Status: DC | PRN
  Administered 2018-02-06 (×2): 25 ug via INTRAVENOUS

## 2018-02-06 MED ORDER — LIDOCAINE 2% (20 MG/ML) 5 ML SYRINGE
INTRAMUSCULAR | Status: AC
  Filled 2018-02-06: qty 5

## 2018-02-06 MED ORDER — PHENYLEPHRINE 40 MCG/ML (10ML) SYRINGE FOR IV PUSH (FOR BLOOD PRESSURE SUPPORT)
PREFILLED_SYRINGE | INTRAVENOUS | Status: AC
  Filled 2018-02-06: qty 10

## 2018-02-06 MED ORDER — LACTATED RINGERS IV SOLN
INTRAVENOUS | Status: DC
  Administered 2018-02-06: 12:00:00 via INTRAVENOUS

## 2018-02-06 MED ORDER — PHENYLEPHRINE 40 MCG/ML (10ML) SYRINGE FOR IV PUSH (FOR BLOOD PRESSURE SUPPORT)
PREFILLED_SYRINGE | INTRAVENOUS | Status: DC | PRN
  Administered 2018-02-06: 120 ug via INTRAVENOUS
  Administered 2018-02-06 (×5): 80 ug via INTRAVENOUS

## 2018-02-06 MED ORDER — FENTANYL CITRATE (PF) 100 MCG/2ML IJ SOLN
INTRAMUSCULAR | Status: AC
  Administered 2018-02-06: 25 ug via INTRAVENOUS
  Filled 2018-02-06: qty 2

## 2018-02-06 MED ORDER — BUPIVACAINE-EPINEPHRINE (PF) 0.25% -1:200000 IJ SOLN
INTRAMUSCULAR | Status: DC | PRN
  Administered 2018-02-06: 10 mL

## 2018-02-06 MED ORDER — ACETAMINOPHEN 500 MG PO TABS
1000.0000 mg | ORAL_TABLET | ORAL | Status: AC
  Administered 2018-02-06: 1000 mg via ORAL
  Filled 2018-02-06: qty 2

## 2018-02-06 MED ORDER — MIDAZOLAM HCL 5 MG/5ML IJ SOLN
INTRAMUSCULAR | Status: DC | PRN
  Administered 2018-02-06: 2 mg via INTRAVENOUS

## 2018-02-06 MED ORDER — FENTANYL CITRATE (PF) 100 MCG/2ML IJ SOLN
INTRAMUSCULAR | Status: DC | PRN
  Administered 2018-02-06: 100 ug via INTRAVENOUS
  Administered 2018-02-06 (×3): 50 ug via INTRAVENOUS

## 2018-02-06 MED ORDER — CELECOXIB 200 MG PO CAPS
400.0000 mg | ORAL_CAPSULE | ORAL | Status: AC
  Administered 2018-02-06: 400 mg via ORAL
  Filled 2018-02-06: qty 2

## 2018-02-06 MED ORDER — SCOPOLAMINE 1 MG/3DAYS TD PT72
1.0000 | MEDICATED_PATCH | TRANSDERMAL | Status: DC
  Administered 2018-02-06: 1.5 mg via TRANSDERMAL
  Filled 2018-02-06: qty 1

## 2018-02-06 MED ORDER — HYDROMORPHONE HCL 2 MG PO TABS
ORAL_TABLET | ORAL | Status: AC
  Filled 2018-02-06: qty 1

## 2018-02-06 MED ORDER — ONDANSETRON HCL 4 MG/2ML IJ SOLN
INTRAMUSCULAR | Status: AC
  Filled 2018-02-06: qty 2

## 2018-02-06 MED ORDER — STERILE WATER FOR IRRIGATION IR SOLN
Status: DC | PRN
  Administered 2018-02-06: 1000 mL

## 2018-02-06 MED ORDER — DEXAMETHASONE SODIUM PHOSPHATE 4 MG/ML IJ SOLN: 4.0000 mg | INTRAMUSCULAR | Status: DC

## 2018-02-06 MED ORDER — HYDROMORPHONE HCL 2 MG PO TABS
2.0000 mg | ORAL_TABLET | Freq: Four times a day (QID) | ORAL | Status: AC | PRN
  Administered 2018-02-06: 2 mg via ORAL

## 2018-02-06 MED ORDER — GABAPENTIN 300 MG PO CAPS
300.0000 mg | ORAL_CAPSULE | ORAL | Status: AC
  Administered 2018-02-06: 300 mg via ORAL
  Filled 2018-02-06: qty 1

## 2018-02-06 MED ORDER — ARTIFICIAL TEARS OPHTHALMIC OINT
TOPICAL_OINTMENT | OPHTHALMIC | Status: AC
  Filled 2018-02-06: qty 3.5

## 2018-02-06 MED ORDER — SUGAMMADEX SODIUM 200 MG/2ML IV SOLN
INTRAVENOUS | Status: AC
  Filled 2018-02-06: qty 2

## 2018-02-06 MED ORDER — SENNA 8.6 MG PO TABS: 1.0000 | ORAL_TABLET | Freq: Every day | ORAL | 0 refills | Status: DC

## 2018-02-06 MED ORDER — MIDAZOLAM HCL 2 MG/2ML IJ SOLN
INTRAMUSCULAR | Status: AC
  Filled 2018-02-06: qty 2

## 2018-02-06 MED ORDER — TRAMADOL HCL 50 MG PO TABS: 50.0000 mg | ORAL_TABLET | Freq: Four times a day (QID) | ORAL | 0 refills | Status: DC | PRN

## 2018-02-06 MED ORDER — PROPOFOL 10 MG/ML IV BOLUS
INTRAVENOUS | Status: AC
  Filled 2018-02-06: qty 20

## 2018-02-06 MED ORDER — DEXAMETHASONE SODIUM PHOSPHATE 10 MG/ML IJ SOLN
INTRAMUSCULAR | Status: AC
  Filled 2018-02-06: qty 1

## 2018-02-06 SURGICAL SUPPLY — 77 items
ADH SKN CLS APL DERMABOND .7 (GAUZE/BANDAGES/DRESSINGS) ×1
AGENT HMST KT MTR STRL THRMB (HEMOSTASIS)
APL SWBSTK 6 STRL LF DISP (MISCELLANEOUS) ×1
APPLICATOR COTTON TIP 6 STRL (MISCELLANEOUS) ×1 IMPLANT
APPLICATOR COTTON TIP 6IN STRL (MISCELLANEOUS) ×3
APPLICATOR SURGIFLO ENDO (HEMOSTASIS) IMPLANT
BAG LAPAROSCOPIC 12 15 PORT 16 (BASKET) ×1 IMPLANT
BAG RETRIEVAL 12/15 (BASKET) ×2
BAG RETRIEVAL 12/15MM (BASKET) ×1
BAG SPEC RTRVL LRG 6X4 10 (ENDOMECHANICALS) ×1
COVER BACK TABLE 60X90IN (DRAPES) ×3 IMPLANT
COVER TIP SHEARS 8 DVNC (MISCELLANEOUS) ×1 IMPLANT
COVER TIP SHEARS 8MM DA VINCI (MISCELLANEOUS) ×2
COVER WAND RF STERILE (DRAPES) IMPLANT
DERMABOND ADVANCED (GAUZE/BANDAGES/DRESSINGS) ×2
DERMABOND ADVANCED .7 DNX12 (GAUZE/BANDAGES/DRESSINGS) ×1 IMPLANT
DILATOR CANAL MILEX (MISCELLANEOUS) ×3 IMPLANT
DRAPE ARM DVNC X/XI (DISPOSABLE) ×4 IMPLANT
DRAPE COLUMN DVNC XI (DISPOSABLE) ×1 IMPLANT
DRAPE DA VINCI XI ARM (DISPOSABLE) ×8
DRAPE DA VINCI XI COLUMN (DISPOSABLE) ×2
DRAPE HYSTEROSCOPY (DRAPE) ×3 IMPLANT
DRAPE SHEET LG 3/4 BI-LAMINATE (DRAPES) ×3 IMPLANT
DRAPE SURG IRRIG POUCH 19X23 (DRAPES) ×3 IMPLANT
ELECT REM PT RETURN 9FT ADLT (ELECTROSURGICAL) ×3
ELECTRODE REM PT RTRN 9FT ADLT (ELECTROSURGICAL) ×1 IMPLANT
GLOVE BIO SURGEON STRL SZ 6 (GLOVE) IMPLANT
GLOVE BIO SURGEON STRL SZ 6.5 (GLOVE) IMPLANT
GLOVE BIO SURGEONS STRL SZ 6.5 (GLOVE)
GLOVE BIOGEL PI IND STRL 7.0 (GLOVE) ×3 IMPLANT
GLOVE BIOGEL PI INDICATOR 7.0 (GLOVE) ×6
GLOVE SURG SS PI 6.5 STRL IVOR (GLOVE) ×9 IMPLANT
GOWN STRL REUS W/TWL XL LVL3 (GOWN DISPOSABLE) ×6 IMPLANT
GYRUS RUMI II 2.5CM BLUE (DISPOSABLE)
GYRUS RUMI II 3.5CM BLUE (DISPOSABLE)
HOLDER FOLEY CATH W/STRAP (MISCELLANEOUS) ×3 IMPLANT
IRRIG SUCT STRYKERFLOW 2 WTIP (MISCELLANEOUS) ×3
IRRIGATION SUCT STRKRFLW 2 WTP (MISCELLANEOUS) ×1 IMPLANT
KIT PROCEDURE DA VINCI SI (MISCELLANEOUS)
KIT PROCEDURE DVNC SI (MISCELLANEOUS) IMPLANT
MANIPULATOR UTERINE 4.5 ZUMI (MISCELLANEOUS) ×3 IMPLANT
NEEDLE HYPO 25X1 1.5 SAFETY (NEEDLE) ×3 IMPLANT
NEEDLE SPNL 18GX3.5 QUINCKE PK (NEEDLE) IMPLANT
OBTURATOR OPTICAL STANDARD 8MM (TROCAR)
OBTURATOR OPTICAL STND 8 DVNC (TROCAR)
OBTURATOR OPTICALSTD 8 DVNC (TROCAR) IMPLANT
PACK ROBOT GYN CUSTOM WL (TRAY / TRAY PROCEDURE) ×3 IMPLANT
PACK ROBOTIC GOWN (GOWN DISPOSABLE) ×3 IMPLANT
PAD POSITIONING PINK XL (MISCELLANEOUS) ×3 IMPLANT
PENCIL BUTTON HOLSTER BLD 10FT (ELECTRODE) ×3 IMPLANT
PORT ACCESS TROCAR AIRSEAL 12 (TROCAR) ×2 IMPLANT
PORT ACCESS TROCAR AIRSEAL 5M (TROCAR) ×4
POUCH SPECIMEN RETRIEVAL 10MM (ENDOMECHANICALS) ×3 IMPLANT
RUMI II 3.0CM BLUE KOH-EFFICIE (DISPOSABLE) IMPLANT
RUMI II GYRUS 2.5CM BLUE (DISPOSABLE) IMPLANT
RUMI II GYRUS 3.5CM BLUE (DISPOSABLE) IMPLANT
SEAL CANN UNIV 5-8 DVNC XI (MISCELLANEOUS) ×4 IMPLANT
SEAL XI 5MM-8MM UNIVERSAL (MISCELLANEOUS) ×8
SET TRI-LUMEN FLTR TB AIRSEAL (TUBING) ×3 IMPLANT
SURGIFLO W/THROMBIN 8M KIT (HEMOSTASIS) IMPLANT
SUT VIC AB 0 CT1 27 (SUTURE)
SUT VIC AB 0 CT1 27XBRD ANTBC (SUTURE) IMPLANT
SUT VIC AB 4-0 PS2 27 (SUTURE) ×6 IMPLANT
SUT VLOC 180 0 9IN  GS21 (SUTURE) ×2
SUT VLOC 180 0 9IN GS21 (SUTURE) ×1 IMPLANT
SYR 10ML LL (SYRINGE) IMPLANT
TIP RUMI ORANGE 6.7MMX12CM (TIP) IMPLANT
TIP UTERINE 5.1X6CM LAV DISP (MISCELLANEOUS) IMPLANT
TIP UTERINE 6.7X10CM GRN DISP (MISCELLANEOUS) IMPLANT
TIP UTERINE 6.7X6CM WHT DISP (MISCELLANEOUS) IMPLANT
TIP UTERINE 6.7X8CM BLUE DISP (MISCELLANEOUS) IMPLANT
TOWEL OR 17X24 6PK STRL BLUE (TOWEL DISPOSABLE) ×3 IMPLANT
TOWEL OR NON WOVEN STRL DISP B (DISPOSABLE) ×3 IMPLANT
TRAP SPECIMEN MUCOUS 40CC (MISCELLANEOUS) ×3 IMPLANT
TRAY FOLEY MTR SLVR 16FR STAT (SET/KITS/TRAYS/PACK) ×3 IMPLANT
UNDERPAD 30X30 (UNDERPADS AND DIAPERS) ×3 IMPLANT
WATER STERILE IRR 1000ML POUR (IV SOLUTION) ×3 IMPLANT

## 2018-02-06 NOTE — Op Note (Signed)
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 (an MD assistant was necessary for tissue manipulation, management of robotic instrumentation, retraction and positioning due to the complexity of the case and hospital policies).   Anesthesia: GETA  Specimens: bilateral ovaries, bilateral fallopian tubes, pelvic washings  Complications: none  Indication for Procedure:  Patient found to have 8cm right solid adnexal mass on imaging.  Operative Findings: No extra-ovarian disease, normal uterus, however on sounding question perforation. No bleeding afterwards, no obvious defect on posterior peritoneum or uterus. 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  Procedure in Detail:  The patient was positioned supine with her arms at her sides prior to anesthesia to ensure comfort. The patient was taken to the operating room and placed under general endotracheal anesthesia without difficulty. She was placed in a dorsolithotomy position and cervical acromial pads were placed. The patient had sequential compression devices for VTE prophylaxis.  The patient was then prepped in the usual sterile fashion.  Time out was performed.   Foley was placed by me. A speculum was placed into the vagina and the anterior cervix grasped with a tenaculum. An os finder had to be used to cannulate the endocervical canal. The sound traveled to >12cm at which time I suspected a perforation. A ZUMI manipulator was placed just inside the presumed endocervical canal with a small colpotimizer ring attached.  A 35mm incision was made in the left upper quadrant palmer's point and a 5 mm Optiview trocar used to enter the abdomen under direct  visualization. With entry into the abdomen and then maintenance of 15 mm of mercury the patient was placed in Trendelenburg position. An incision was made inferior to the umbilicus and used for an 80mm trocar. To the right of this one additional 41mm trocar was placed approximately 6-8 cm from the closest trocar. An 50mm trocar was also placed in the left lateral abdominal wall. 8 mm robotic trochars were inserted. The 79mm LUQ trocar was changed out to 6mm airseal. The robot was docked.  The abdomen was inspected as was the pelvis.  Findings as noted. Pelvic washings were obtained. The mass/cyst was noted on the right. An incision was made on the right pelvic side wall peritoneum parallel to the IP ligament and the retroperitoneal space entered. The right ureter was identified and the para-rectal space was developed. A window was created in the broad ligament above the ureter. The infundibulopelvic vessels were skeletonized cauterized and transected. The right utero-ovarian ligaments similarly were cauterized and transected. Specimen was placed in an Endo Catch bag.  In a similar manner the contralateral adnexa was isolated, the spaces opened, the ureter identified, and the IP ligament isolated, clamped, coagulated and transected. This adnexa was placed in a separate Endo catch bag.   The operative sites were inspected and hemostasis was noted.   The robot was undocked.  We extended the infraumbilical incision in a U shaped fashion. Then incised the fascia to allow removal and morcellation of the mass. First the bag holding the left tube/ovary was removed through this trocar site. The bag holding the right mass/tube/ovary was brought to the same trocar site and the bag opened. The mass was morcellated to facilitate removal from the abdominal cavity.  The right adnexal mass was sent for  frozen section and this returned benign.  The ports were all removed. The fascial closure inferior to the umbilicus and at  the left upper quadrant port was performed with 0 Vicryl.  All incisions were closed with interrupted 4-0 vicryl and running subcuticular 4-0 Monocryl suture. Dermabond was applied.  Sponge, lap and needle counts were correct x 2.          Disposition: PACU          Condition: stable

## 2018-02-06 NOTE — Anesthesia Preprocedure Evaluation (Addendum)
Anesthesia Evaluation  Patient identified by MRN, date of birth, ID band Patient awake    Reviewed: Allergy & Precautions, NPO status , Patient's Chart, lab work & pertinent test results  Airway Mallampati: II  TM Distance: >3 FB Neck ROM: Full    Dental  (+) Dental Advisory Given, Partial Lower   Pulmonary Current Smoker,    Pulmonary exam normal breath sounds clear to auscultation       Cardiovascular Exercise Tolerance: Good negative cardio ROS Normal cardiovascular exam Rhythm:Regular Rate:Normal     Neuro/Psych negative neurological ROS  negative psych ROS   GI/Hepatic negative GI ROS, Neg liver ROS,   Endo/Other  negative endocrine ROS  Renal/GU negative Renal ROS     Musculoskeletal negative musculoskeletal ROS (+)   Abdominal   Peds  Hematology negative hematology ROS (+)   Anesthesia Other Findings Day of surgery medications reviewed with the patient.  Reproductive/Obstetrics Pelvic mass                            Anesthesia Physical Anesthesia Plan  ASA: II  Anesthesia Plan: General   Post-op Pain Management:    Induction: Intravenous  PONV Risk Score and Plan: 3 and Scopolamine patch - Pre-op, Midazolam, Dexamethasone and Ondansetron  Airway Management Planned: Oral ETT  Additional Equipment:   Intra-op Plan:   Post-operative Plan: Extubation in OR  Informed Consent: I have reviewed the patients History and Physical, chart, labs and discussed the procedure including the risks, benefits and alternatives for the proposed anesthesia with the patient or authorized representative who has indicated his/her understanding and acceptance.   Dental advisory given  Plan Discussed with: CRNA  Anesthesia Plan Comments:         Anesthesia Quick Evaluation

## 2018-02-06 NOTE — Anesthesia Postprocedure Evaluation (Signed)
Anesthesia Post Note  Patient: PENELOPE FITTRO  Procedure(s) Performed: XI ROBOTIC ASSISTED BILATERAL SALPINGO OOPHORECTOMY , PELVIC WASHINGS (Bilateral )     Patient location during evaluation: PACU Anesthesia Type: General Level of consciousness: awake and alert Pain management: pain level controlled Vital Signs Assessment: post-procedure vital signs reviewed and stable Respiratory status: spontaneous breathing, nonlabored ventilation and respiratory function stable Cardiovascular status: blood pressure returned to baseline and stable Postop Assessment: no apparent nausea or vomiting Anesthetic complications: no    Last Vitals:  Vitals:   02/06/18 1900 02/06/18 2035  BP: (!) 111/54 (!) 117/48  Pulse: (!) 57   Resp: 12 17  Temp:  37 C  SpO2: 100%     Last Pain:  Vitals:   02/06/18 2035  TempSrc:   PainSc: [place]

## 2018-02-06 NOTE — Interval H&P Note (Signed)
History and Physical Interval Note:  02/06/2018 4:03 PM  Sharon Wright  has presented today for surgery, with the diagnosis of PELVIC MASS  The various methods of treatment have been discussed with the patient and family. After consideration of risks, benefits and other options for treatment, the patient has consented to  Procedure(s): XI ROBOTIC [place] (Bilateral) as a surgical intervention .  The patient's history has been reviewed, patient examined, no change in status, stable for surgery.  I have reviewed the patient's chart and labs.  Questions were answered to the patient's satisfaction.   Patient wishes possible hysterectomy if fibroid or malignancy  Isabel Caprice

## 2018-02-06 NOTE — Anesthesia Procedure Notes (Signed)
Procedure Name: Intubation Date/Time: 02/06/2018 4:51 PM Performed by: Bonney Aid, CRNA Pre-anesthesia Checklist: Patient identified, Emergency Drugs available, Suction available and Patient being monitored Patient Re-evaluated:Patient Re-evaluated prior to induction Oxygen Delivery Method: Circle system utilized Preoxygenation: Pre-oxygenation with 100% oxygen Induction Type: IV induction Ventilation: Mask ventilation without difficulty Laryngoscope Size: Mac and 3 Grade View: Grade I Tube type: Oral Tube size: 7.0 mm Number of attempts: 1 Airway Equipment and Method: Stylet and Oral airway Placement Confirmation: ETT inserted through vocal cords under direct vision,  positive ETCO2 and breath sounds checked- equal and bilateral Secured at: 21 cm Tube secured with: Tape Dental Injury: Teeth and Oropharynx as per pre-operative assessment

## 2018-02-06 NOTE — Progress Notes (Signed)
2015 - D/C instructions reviewed with patient and spouse Cassie Shedlock. Post-op care reviewed, patient and spouse verbalized understanding. General Anesthesia information sheet reviewed both patient and spouse verbalized understanding.

## 2018-02-06 NOTE — Discharge Instructions (Signed)
02/06/2018  Return to work: Discuss with Dr. Gerarda Fraction at your next visit.  Activity: 1. Be up and out of the bed during the day.  Take a nap if needed.  You may walk up steps but be careful and use the hand rail.  Stair climbing will tire you more than you think, you may need to stop part way and rest.   2. No lifting, pulling, pushing, or straining anything more than 5 pounds for 6 weeks. You may be able to return to full activity at 4 weeks, but this will be further discussed at your postoperative visits.  3. No driving for 2 weeks.  Do Not drive if you are taking narcotic pain medicine.  4. Shower daily.  Use soap and water on your incision and pat dry; don't rub.   5. No sexual activity and nothing in the vagina for 5-6 weeks.  Medications:  - As long as you have never been told to avoid ibuprofen and/or tylenol use these first for pain control. Take these regularly (every 6 hours) to decrease the build up of pain.  - If necessary, for severe pain not relieved by the above, take your pain prescription.  - While taking your prescription pain medication you should take stool softener (I prefer you purchase docusate sodium "Colace") 2-3 times per day to reduce the likelihood of constipation. If this causes diarrhea, stop its use.  Diet: 1. Low sodium Heart Healthy Diet is recommended. 2. It is safe to use a gentle laxative if you have difficulty moving your bowels as long as you have no nausea or vomiting and you are passing flatus.  Wound Care: 1. Keep clean and dry.  Shower daily. 2. If you have steri-strips on your incision these will fall off after 2-3 weeks. At 3 weeks you may take these off carefully after showering.   Reasons to call the Doctor:   Fever - Oral temperature greater than 100.4 degrees Fahrenheit  Foul-smelling vaginal discharge  Difficulty urinating  Nausea and vomiting  Increased pain at the site of the incision that is unrelieved with pain  medicine.  Difficulty breathing with or without chest pain  New calf pain especially if only on one side  Sudden, continuing increased vaginal bleeding/drainage with or without clots.   Follow-up: 1. See Dr. Gerarda Fraction in 1-2 weeks.  Contacts: For questions or concerns you should contact:  Our office [phone] After hours and on week-ends for urgent issues relating to our treatment for you, please call [phone] and ask to speak to the physician on call for Gynecologic Oncology  We will not refill pain medications if we are not your primary provider or after hours. Plan accordingly if you are running low.

## 2018-02-06 NOTE — Transfer of Care (Signed)
Immediate Anesthesia Transfer of Care Note  Patient: Sharon Wright  Procedure(s) Performed: XI ROBOTIC ASSISTED BILATERAL SALPINGO OOPHORECTOMY , PELVIC WASHINGS (Bilateral )  Patient Location: PACU  Anesthesia Type:General  Level of Consciousness: awake, alert  and oriented  Airway & Oxygen Therapy: Patient Spontanous Breathing and Patient connected to face mask oxygen  Post-op Assessment: Report given to RN  Post vital signs: Reviewed and stable  Last Vitals:  Vitals Value Taken Time  BP 120/67 02/06/2018  6:22 PM  Temp    Pulse 71 02/06/2018  6:26 PM  Resp 14 02/06/2018  6:26 PM  SpO2 100 % 02/06/2018  6:26 PM  Vitals shown include unvalidated device data.  Last Pain:  Vitals:   02/06/18 1204  TempSrc:   PainSc: 0-No pain         Complications: No apparent anesthesia complications

## 2018-02-07 ENCOUNTER — Encounter (HOSPITAL_BASED_OUTPATIENT_CLINIC_OR_DEPARTMENT_OTHER): Payer: Self-pay | Admitting: Obstetrics

## 2018-02-07 ENCOUNTER — Telehealth: Payer: Self-pay | Admitting: *Deleted

## 2018-02-07 NOTE — Telephone Encounter (Signed)
Sharon Wright called to ask if it was ok that she take a Tylenol in between the time she takes the Tramadol and Ibuprofen to help with discomfort.  Per Joylene John, NP this will be ok.  Patient verbalized understanding.

## 2018-02-08 ENCOUNTER — Telehealth: Payer: Self-pay

## 2018-02-08 NOTE — Telephone Encounter (Signed)
Spoke with Pt about rash on arms and abdomen and she states that is is better not as itchy or red today. Pt is using benadryl 25 mg every 4 hrs as needed.Told her that Melissa the NP said that she received Ancef in the OR and the ras is from that ATB. Continue taking the benadryl as needed. The Macrobid for the UTI was begun on Friday 02-03-18.  Pt is almost done with this ATB.  Told her to complete the Macrobid as prescribed as Melissa stated that this ATB would not be causing her rash. Pt had surgery on 02-06-18.  She has been using 1 Senokot-S at hs.  She has not moved her bowels since surgery.  Told Ms Millward to take a capful of Miralax now and repeat this evening if no good evacuation of stool.  She can use a capful bid prn.   She needs to call the office on Friday 02-10-18 in the morning if she has not had a good BM for further recommendations. Pt verbalized understanding.

## 2018-02-09 ENCOUNTER — Telehealth: Payer: Self-pay

## 2018-02-09 NOTE — Telephone Encounter (Signed)
Told Sharon Wright that the final surgical pathology from 02/06/18 report showed no cancer.  Sharon Wright states that the rash is decreasing and benadry has been effective for the itch. She moved her bowels a small amount today.  Told her to use the Miralax bid until she has a good evacuation of stool.

## 2018-02-13 ENCOUNTER — Telehealth: Payer: Self-pay

## 2018-02-13 NOTE — Telephone Encounter (Signed)
Received [facility] call center update that pt called in with vag bleeding when used restroom and wiped (surgery last Monday).  Per Joylene John NP "see how she is, monitor bleeding, it should resolve"  Pt's husband called in and relayed info from [facility] NP.  Voiced understanding, He reported wife had nothing on pad through night and only pinkish when wiped.  Encouraged to call with any concerns or worsening symptoms. Voiced understanding. No other needs voiced at this time.

## 2018-02-14 NOTE — Progress Notes (Signed)
S:  02/06/18 she underwent Robotic-assisted laparoscopic bilateral salpingoophorectomy, Pelvic washings  Final pathology 1. Ovary and fallopian tube, right - BENIGN STROMAL TUMOR OF RIGHT OVARY, CONSISTENT WITH FIBROMA. - BENIGN UNREMARKABLE FALLOPIAN TUBE. - NO EVIDENCE OF MALIGNANCY. 2. Ovary and fallopian tube, left - BENIGN UNREMARKABLE OVARY AND FALLOPIAN TUBE. - NO EVIDENCE OF MALIGNANCY. Washings negative  She did have more pain than she was expecting postop. States the pain is mostly low mid-back. Has a h/o ruptured disc and back surgery age 36. Denies fever. Having spotting.  Thinks overall improving since surgery.  O:  Vitals:   02/17/18 1002  BP: 111/64  Pulse: 68  Resp: 18  Temp: 98.3 F (36.8 C)  SpO2: 99%    Abdo wound CDI soft NTND  A/P Early postop Discussed activity restrictions Followup with MCross, NP for postop check in one month then return to Dr. Gwynne Edinger care noting she still has her uterus/cervix Check renal US given the back pain. UA check RX for vicodin given  Cc: Vania Rea, MD (Referring Ob/Gyn) Arnetha Courser, MD (PCP)

## 2018-02-17 ENCOUNTER — Inpatient Hospital Stay: Payer: BLUE CROSS/BLUE SHIELD

## 2018-02-17 ENCOUNTER — Inpatient Hospital Stay: Payer: BLUE CROSS/BLUE SHIELD | Attending: Obstetrics | Admitting: Obstetrics

## 2018-02-17 ENCOUNTER — Encounter: Payer: Self-pay | Admitting: Obstetrics

## 2018-02-17 VITALS — BP 111/64 | HR 68 | Temp 98.3°F | Resp 18 | Ht 69.0 in | Wt 177.0 lb

## 2018-02-17 DIAGNOSIS — R300 Dysuria: Secondary | ICD-10-CM

## 2018-02-17 DIAGNOSIS — D270 Benign neoplasm of right ovary: Secondary | ICD-10-CM | POA: Diagnosis not present

## 2018-02-17 DIAGNOSIS — Z90722 Acquired absence of ovaries, bilateral: Secondary | ICD-10-CM | POA: Insufficient documentation

## 2018-02-17 DIAGNOSIS — R1900 Intra-abdominal and pelvic swelling, mass and lump, unspecified site: Secondary | ICD-10-CM

## 2018-02-17 LAB — URINALYSIS, COMPLETE (UACMP) WITH MICROSCOPIC
Bacteria, UA: NONE SEEN
Bilirubin Urine: NEGATIVE
Glucose, UA: NEGATIVE mg/dL
Ketones, ur: 5 mg/dL — AB
Leukocytes, UA: NEGATIVE
Nitrite: NEGATIVE
Protein, ur: NEGATIVE mg/dL
SPECIFIC GRAVITY, URINE: 1.018 (ref 1.005–1.030)
pH: 5 (ref 5.0–8.0)

## 2018-02-17 MED ORDER — HYDROCODONE-ACETAMINOPHEN 5-325 MG PO TABS: 1.0000 | ORAL_TABLET | Freq: Four times a day (QID) | ORAL | 0 refills | Status: DC | PRN

## 2018-02-17 NOTE — Patient Instructions (Signed)
Return in one month for another followup (with our Nurse Practioner, Melissa Still no heavy lifting, pulling, pushing until you are 6 weeks at least postop We'll check a urine and an ultrasound to followup on your back pain

## 2018-02-18 LAB — URINE CULTURE: Culture: <10000 — AB

## 2018-02-20 ENCOUNTER — Telehealth: Payer: Self-pay | Admitting: *Deleted

## 2018-02-20 NOTE — Telephone Encounter (Signed)
I called patient to inform her of her urine test results from 02/17/18.  Per Sharon John, NP no evidence of infection.  Patient verbalized understanding.

## 2018-02-21 ENCOUNTER — Ambulatory Visit (HOSPITAL_COMMUNITY)
Admission: RE | Admit: 2018-02-21 | Discharge: 2018-02-21 | Disposition: A | Discharge status: Home / Self Care | Payer: BLUE CROSS/BLUE SHIELD | Source: Ambulatory Visit | Attending: Obstetrics | Admitting: Obstetrics

## 2018-02-21 DIAGNOSIS — Z9889 Other specified postprocedural states: Secondary | ICD-10-CM | POA: Insufficient documentation

## 2018-02-21 DIAGNOSIS — M549 Dorsalgia, unspecified: Secondary | ICD-10-CM | POA: Diagnosis not present

## 2018-02-21 DIAGNOSIS — R300 Dysuria: Secondary | ICD-10-CM | POA: Insufficient documentation

## 2018-02-21 DIAGNOSIS — N281 Cyst of kidney, acquired: Secondary | ICD-10-CM | POA: Diagnosis not present

## 2018-02-24 ENCOUNTER — Telehealth: Payer: Self-pay

## 2018-02-24 NOTE — Telephone Encounter (Signed)
Incoming call from pt regarding renal u/s results. I let pt know that Joylene John NP reviewed results and per her, no reason noted for back pain related to this u/s report but that we would let Dr Gerarda Fraction review on Monday.  Pt reports she has had ongoing kidney and spine issues over the years.  Encouraged her to call her PCP office for follow up, in case they would like to do further testing and pt verbalized understanding. No other needs per pt at this time.

## 2018-02-27 ENCOUNTER — Telehealth: Payer: Self-pay

## 2018-02-27 NOTE — Telephone Encounter (Signed)
Outgoing call to patient per Dr Gerarda Fraction regarding renal u/s "OK, kidney cyst, no obvious injury or concern."  Pt voiced understanding.  Pt would like copy faxed to her PCP which is Dr Sanda Klein at [facility] in [place]. No other needs per pt at this time. They are listed as [facility] now, should be through epic but faxed to their office  [phone], also.

## 2018-03-17 ENCOUNTER — Inpatient Hospital Stay: Payer: BLUE CROSS/BLUE SHIELD | Attending: Obstetrics | Admitting: Gynecologic Oncology

## 2018-03-17 ENCOUNTER — Encounter: Payer: Self-pay | Admitting: Gynecologic Oncology

## 2018-03-17 ENCOUNTER — Encounter: Payer: Self-pay | Admitting: Oncology

## 2018-03-17 VITALS — BP 116/62 | HR 77 | Temp 98.0°F | Resp 20 | Ht 69.0 in | Wt 179.0 lb

## 2018-03-17 DIAGNOSIS — R1900 Intra-abdominal and pelvic swelling, mass and lump, unspecified site: Secondary | ICD-10-CM

## 2018-03-17 DIAGNOSIS — Z90722 Acquired absence of ovaries, bilateral: Secondary | ICD-10-CM | POA: Insufficient documentation

## 2018-03-17 DIAGNOSIS — D270 Benign neoplasm of right ovary: Secondary | ICD-10-CM | POA: Insufficient documentation

## 2018-03-17 NOTE — Patient Instructions (Signed)
Plan to return to work on April 03, 2018. Plan to use lifting precautions. We will refer you to a spine specialist for your back pain.  Please call for any questions or concerns and follow up if the future if needed.  Please call for any needs. I have placed some information about a hernia just to let you know what to look for in the future since you do heavy lifting at work.   Hernia, Adult     A hernia happens when tissue inside your body pushes out through a weak spot in your belly muscles (abdominal wall). This makes a round lump (bulge). The lump may be:  In a scar from surgery that was done in your belly (incisional hernia).  Near your belly button (umbilical hernia).  In your groin (inguinal hernia). Your groin is the area where your leg meets your lower belly (abdomen). This kind of hernia could also be: ? In your scrotum, if you are female. ? In folds of skin around your vagina, if you are female.  In your upper thigh (femoral hernia).  Inside your belly (hiatal hernia). This happens when your stomach slides above the muscle between your belly and your chest (diaphragm). If your hernia is small and it does not cause pain, you may not need treatment. If your hernia is large or it causes pain, you may need surgery. Follow these instructions at home: Activity  Avoid stretching or overusing (straining) the muscles near your hernia. Straining can happen when you: ? Lift something heavy. ? Poop (have a bowel movement).  Do not lift anything that is heavier than 10 lb (4.5 kg), or the limit that you are told, until your doctor says that it is safe.  Use the strength of your legs when you lift something heavy. Do not use only your back muscles to lift. General instructions  Do these things if told by your doctor so you do not have trouble pooping (constipation): ? Drink enough fluid to keep your pee (urine) pale yellow. ? Eat foods that are high in fiber. These include fresh  fruits and vegetables, whole grains, and beans. ? Limit foods that are high in fat and processed sugars. These include foods that are fried or sweet. ? Take medicine for trouble pooping.  When you cough, try to cough gently.  You may try to push your hernia in by very gently pressing on it when you are lying down. Do not try to force the bulge back in if it will not push in easily.  If you are overweight, work with your doctor to lose weight safely.  Do not use any products that have nicotine or tobacco in them. These include cigarettes and e-cigarettes. If you need help quitting, ask your doctor.  If you will be having surgery (hernia repair), watch your hernia for changes in shape, size, or color. Tell your doctor if you see any changes.  Take over-the-counter and prescription medicines only as told by your doctor.  Keep all follow-up visits as told by your doctor. Contact a doctor if:  You get new pain, swelling, or redness near your hernia.  You poop fewer times in a week than normal.  You have trouble pooping.  You have poop (stool) that is more dry than normal.  You have poop that is harder or larger than normal. Get help right away if:  You have a fever.  You have belly pain that gets worse.  You feel sick to  your stomach (nauseous).  You throw up (vomit).  Your hernia cannot be pushed in by very gently pressing on it when you are lying down. Do not try to force the bulge back in if it will not push in easily.  Your hernia: ? Changes in shape or size. ? Changes color. ? Feels hard or it hurts when you touch it. These symptoms may represent a serious problem that is an emergency. Do not wait to see if the symptoms will go away. Get medical help right away. Call your local emergency services (911 in the [place] Summary  A hernia happens when tissue inside your body pushes out through a weak spot in the belly muscles. This creates a bulge.  If your hernia is small  and it does not hurt, you may not need treatment. If your hernia is large or it hurts, you may need surgery.  If you will be having surgery, watch your hernia for changes in shape, size, or color. Tell your doctor about any changes. This information is not intended to replace advice given to you by your health care provider. Make sure you discuss any questions you have with your health care provider. Document Released: 08/05/2009 Document Revised: 11/17/2016 Document Reviewed: 11/17/2016 Elsevier Interactive Patient Education  2019 [employer].

## 2018-03-17 NOTE — Progress Notes (Signed)
Gynecologic Oncology Post-operative Follow up  S: 58 year old female who on 02/06/18 underwent a robotic-assisted laparoscopic bilateral salpingo-oophorectomy with pelvic washings with Dr. Precious Haws. Final pathology revealed:  Final pathology 1. Ovary and fallopian tube, right - BENIGN STROMAL TUMOR OF RIGHT OVARY, CONSISTENT WITH FIBROMA. - BENIGN UNREMARKABLE FALLOPIAN TUBE. - NO EVIDENCE OF MALIGNANCY. 2. Ovary and fallopian tube, left - BENIGN UNREMARKABLE OVARY AND FALLOPIAN TUBE. - NO EVIDENCE OF MALIGNANCY. Washings negative  Her post-operative course was uneventful but she did report more pain than she was expecting postop and reported it mostly as lower mid-back and low pelvic. Has a h/o ruptured disc and back surgery age 28.  Today she feels she is improving.  She feels she should be able to go back to work starting April 03, 2018. 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  Tolerating diet with no nausea or emesis.  She is trying not to overeat because she feels her incisions pulling when her abdomen becomes full.  Bowels and bladder functioning without difficulty.  Stating she feels her lower pelvic discomfort has resolved from surgery but states her back discomfort remains.  She states the pain was present before surgery and she was hoping the surgery would have helped.  The pain starts in the lower back and goes to her buttocks and upper thighs.  She takes ibuprofen intermittently but would like to see someone about her back issues.  No fever or chills reported.  No incisional issues. No spotting reported.    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 There is not felt to be hydronephrosis.  Flow from each distal ureter noted in the bladder.  Cyst arising from lower pole left kidney. Renal echogenicity and cortical thickness are normal bilaterally. -Urine with insignificant growth on 02/17/18  Alert, oriented x3. Lungs clear. Heart regular in rate and rhythm. No CVA tenderness or lower back tenderness on palpation. Abdomen soft with active bowel sounds. Lap sites well healed with no erythema or drainage. No evidence of herniation. No lower extrem edema.  A/P Return to work with no restrictions on Apr 03, 2018 per Dr. Gerarda Fraction Referral to [facility] for back issues Return to Dr. Gwynne Edinger care noting she still has her uterus/cervix She is advised to call for any questions or concerns Discussed signs and symptoms of a hernia given her occupation with heavy lifting and recent surgery-handout given  Cc: Vania Rea, MD (Referring Ob/Gyn) Arnetha Courser, MD (PCP)

## 2018-03-21 ENCOUNTER — Telehealth: Payer: Self-pay | Admitting: Oncology

## 2018-03-21 NOTE — Telephone Encounter (Signed)
[facility] regarding Neurosurgery referral.  She said she has not heard anything from them yet.  She does have an appointment next Friday with [facility].

## 2018-03-24 ENCOUNTER — Telehealth: Payer: Self-pay | Admitting: Oncology

## 2018-03-24 NOTE — Telephone Encounter (Signed)
Received fax notification that Sharon Wright has an appointment on 03/28/18 with Dr. Christella Noa at 10:30.

## 2018-03-24 NOTE — Telephone Encounter (Signed)
[facility] and left a message for the new patient coordinator, Rollene Fare, asking for the status of patient's referral.

## 2018-03-28 DIAGNOSIS — M5440 Lumbago with sciatica, unspecified side: Secondary | ICD-10-CM | POA: Diagnosis not present

## 2018-03-31 ENCOUNTER — Ambulatory Visit (INDEPENDENT_AMBULATORY_CARE_PROVIDER_SITE_OTHER): Payer: BLUE CROSS/BLUE SHIELD

## 2018-03-31 DIAGNOSIS — Z23 Encounter for immunization: Secondary | ICD-10-CM

## 2018-04-14 ENCOUNTER — Ambulatory Visit (INDEPENDENT_AMBULATORY_CARE_PROVIDER_SITE_OTHER): Payer: BLUE CROSS/BLUE SHIELD | Admitting: Family Medicine

## 2018-04-14 ENCOUNTER — Encounter: Payer: Self-pay | Admitting: Family Medicine

## 2018-04-14 ENCOUNTER — Other Ambulatory Visit: Payer: Self-pay

## 2018-04-14 VITALS — BP 122/74 | HR 76 | Temp 97.9°F | Resp 14 | Ht 69.0 in | Wt 184.2 lb

## 2018-04-14 DIAGNOSIS — N3001 Acute cystitis with hematuria: Secondary | ICD-10-CM

## 2018-04-14 DIAGNOSIS — R05 Cough: Secondary | ICD-10-CM | POA: Diagnosis not present

## 2018-04-14 DIAGNOSIS — R059 Cough, unspecified: Secondary | ICD-10-CM

## 2018-04-14 DIAGNOSIS — J0140 Acute pansinusitis, unspecified: Secondary | ICD-10-CM | POA: Diagnosis not present

## 2018-04-14 MED ORDER — PROMETHAZINE-DM 6.25-15 MG/5ML PO SOLN: 5.0000 mL | Freq: Three times a day (TID) | ORAL | 0 refills | Status: DC | PRN

## 2018-04-14 MED ORDER — LORATADINE 10 MG PO TABS: 10.0000 mg | ORAL_TABLET | Freq: Every day | ORAL | 11 refills | Status: DC

## 2018-04-14 MED ORDER — DOXYCYCLINE HYCLATE 100 MG PO TABS: 100.0000 mg | ORAL_TABLET | Freq: Two times a day (BID) | ORAL | 0 refills | Status: AC

## 2018-04-14 MED ORDER — FLUTICASONE PROPIONATE 50 MCG/ACT NA SUSP: 2.0000 | Freq: Every day | NASAL | 6 refills | Status: DC

## 2018-04-14 NOTE — Progress Notes (Signed)
Name: Sharon Wright   MRN: [id number]    DOB: 12/01/1960   Date:04/14/2018       Progress Note  Subjective  Chief Complaint  Chief Complaint  Patient presents with  . URI    headache, cough, congested, eyes swollen  . Urinary Tract Infection    pain when urinationg, odor, hematuria, spoke to [facility] and was put on Cipro    HPI  Pt presents with concern for "swollen" eyes - says the "bags under my eyes have been really swollen" over the last 7-8 days.  She does endorse ear discomfort and nasal congestion, having tinnitus bilaterally, there is some mild pain around the eyes, and her upper teeth have some pain.  She denies abdominal pain (other than residual from recent pelvic mass removal in December), vomiting, chest pain, or shortness of breath, sore throat. Does have some coughing at night.  She is currently taking cipro for a UTI that was diagnosed with Telehealth on 04/09/2018.  She was given a 7 day course.  She reports not feeling significantly better since taking the cipro, but the foul smell of her urine has improved. She reports having a "large" kidney stone present, but is not wanting to see urology yet - has appointment in April.   Patient Active Problem List   Diagnosis Date Noted  . Pelvic mass in female 01/11/2018  . Obesity (BMI 30.0-34.9) 08/16/2017  . Tobacco abuse 03/22/2017    Social History   Tobacco Use  . Smoking status: Current Every Day Smoker    Packs/day: 1.00    Years: 40.00    Pack years: 40.00    Types: Cigarettes  . Smokeless tobacco: Never Used  Substance Use Topics  . Alcohol use: No     Current Outpatient Medications:  .  acetaminophen (TYLENOL) 500 MG tablet, Take 1,000-1,500 mg by mouth every 6 (six) hours as needed for moderate pain or headache. , Disp: , Rfl:  .  cholecalciferol (VITAMIN D) 1000 UNITS tablet, Take 1,000 Units by mouth daily. , Disp: , Rfl:  .  ciprofloxacin (CIPRO) 500 MG tablet, Take 500 mg by mouth every 12  (twelve) hours., Disp: , Rfl:  .  ibuprofen (ADVIL,MOTRIN) 800 MG tablet, Take 1 tablet (800 mg total) by mouth every 8 (eight) hours as needed., Disp: 30 tablet, Rfl: 0 .  polyethylene glycol (MIRALAX / GLYCOLAX) packet, Take 17 g by mouth daily as needed for mild constipation. , Disp: , Rfl:  .  senna (SENOKOT) 8.6 MG TABS tablet, Take 1 tablet (8.6 mg total) by mouth at bedtime., Disp: 120 each, Rfl: 0 .  HYDROcodone-acetaminophen (NORCO) 5-325 MG tablet, Take 1 tablet by mouth every 6 (six) hours as needed for moderate pain. (Patient not taking: Reported on 04/14/2018), Disp: 12 tablet, Rfl: 0  Allergies  Allergen Reactions  . Oxycodone Nausea And Vomiting  . Penicillins Rash and Other (See Comments)    Childhood allergy rash after 3 days. No SOB, no hospitilization Has patient had a PCN reaction causing immediate rash, facial/tongue/throat swelling, SOB or lightheadedness with hypotension: No Has patient had a PCN reaction causing severe rash involving mucus membranes or skin necrosis: No Has patient had a PCN reaction that required hospitalization: No Has patient had a PCN reaction occurring within the last 10 years: No If all of the above answers are "NO", then may proceed with Cephalos    I personally reviewed active problem list, medication list, allergies, notes from last encounter, lab  results with the patient/caregiver today.  ROS  Ten systems reviewed and is negative except as mentioned in HPI  Objective  Vitals:   04/14/18 1014  BP: 122/74  Pulse: 76  Resp: 14  Temp: 97.9 F (36.6 C)  TempSrc: Oral  SpO2: 95%  Weight: 184 lb 3.2 oz (83.6 kg)  Height: 5\' 9"  (1.753 m)    Body mass index is 27.2 kg/m.  Nursing Note and Vital Signs reviewed.  Physical Exam  Constitutional: Patient appears well-developed and well-nourished.  No distress.  HEENT: head atraumatic, normocephalic, pupils equal and reactive to light, Bilateral TM's without erythema or effusion,   bilateral maxillary and frontal sinuses are tender, neck supple with moderate lymphadenopathy, throat within normal limits - no erythema or exudate, no tonsillar swelling Cardiovascular: Normal rate, regular rhythm and normal heart sounds.  No murmur heard. No BLE edema. Pulmonary/Chest: Effort normal and breath sounds clear bilaterally. No respiratory distress. Abdominal: Soft, bowel sounds normal, there is some  Tenderness to the upper abdomen 0 had pelvic mass removed in January and is still healing, no HSM. No CVA tenderness. Psychiatric: Patient has a normal mood and affect. behavior is normal. Judgment and thought content normal.  No results found for this or any previous visit (from the past 72 hour(s)).  Assessment & Plan  1. Acute cystitis with hematuria - She has hx nephrolithiasis, advised that if not improved after completing Cipro, she needs to see her urology at [facility] in [place]. - No CVA tenderness, abdominal tenderness is located over the laparotomy scars from recent surgical procedure.  - Red flags discussed in detail.  2. Acute non-recurrent pansinusitis - loratadine (CLARITIN) 10 MG tablet; Take 1 tablet (10 mg total) by mouth daily.  Dispense: 30 tablet; Refill: 11 - fluticasone (FLONASE) 50 MCG/ACT nasal spray; Place 2 sprays into both nostrils daily.  Dispense: 16 g; Refill: 6 - doxycycline (VIBRA-TABS) 100 MG tablet; Take 1 tablet (100 mg total) by mouth 2 (two) times daily for 10 days.  Dispense: 20 tablet; Refill: 0  3. Cough - Promethazine-DM 6.25-15 MG/5ML SOLN; Take 5 mLs by mouth every 8 (eight) hours as needed (Cough).  Dispense: 118 mL; Refill: 0 - loratadine (CLARITIN) 10 MG tablet; Take 1 tablet (10 mg total) by mouth daily.  Dispense: 30 tablet; Refill: 11   -Red flags and when to present for emergency care or RTC including fever >101.42F, chest pain, shortness of breath, new/worsening/un-resolving symptoms, reviewed with patient at time of visit.  Follow up and care instructions discussed and provided in AVS.

## 2018-04-14 NOTE — Patient Instructions (Signed)

## 2018-04-26 ENCOUNTER — Ambulatory Visit (INDEPENDENT_AMBULATORY_CARE_PROVIDER_SITE_OTHER): Payer: BLUE CROSS/BLUE SHIELD | Admitting: Nurse Practitioner

## 2018-04-26 ENCOUNTER — Encounter: Payer: Self-pay | Admitting: Nurse Practitioner

## 2018-04-26 VITALS — BP 102/60 | HR 90 | Temp 98.2°F | Resp 16 | Ht 69.0 in | Wt 182.5 lb

## 2018-04-26 DIAGNOSIS — Z1239 Encounter for other screening for malignant neoplasm of breast: Secondary | ICD-10-CM

## 2018-04-26 DIAGNOSIS — R05 Cough: Secondary | ICD-10-CM

## 2018-04-26 DIAGNOSIS — J0140 Acute pansinusitis, unspecified: Secondary | ICD-10-CM | POA: Diagnosis not present

## 2018-04-26 DIAGNOSIS — R059 Cough, unspecified: Secondary | ICD-10-CM

## 2018-04-26 DIAGNOSIS — J3489 Other specified disorders of nose and nasal sinuses: Secondary | ICD-10-CM

## 2018-04-26 MED ORDER — MONTELUKAST SODIUM 10 MG PO TABS: 10.0000 mg | ORAL_TABLET | Freq: Every day | ORAL | 0 refills | Status: DC

## 2018-04-26 MED ORDER — DM-GUAIFENESIN ER 30-600 MG PO TB12: 1.0000 | ORAL_TABLET | Freq: Two times a day (BID) | ORAL | 0 refills | Status: DC

## 2018-04-26 MED ORDER — BENZONATATE 100 MG PO CAPS: 200.0000 mg | ORAL_CAPSULE | Freq: Three times a day (TID) | ORAL | 1 refills | Status: DC | PRN

## 2018-04-26 NOTE — Patient Instructions (Addendum)
-   Continue Flonase 2 sprays in each nostril daily - Take loratadine once daily - Add on Singulair once daily   - humidified air  - guaifenesin 600-1200mg  every 12 hours (sent as prescription, you can get this OTC) ______  After 3 days if not improvement- Please call and I will send in a different antibiotic. If unimproved then I will send to ENT  _____ Please do call to schedule your mammogram; the number to schedule one at either [facility] or [facility] is [phone]

## 2018-04-26 NOTE — Progress Notes (Signed)
Name: Sharon Wright   MRN: [id number]    DOB: May 23, 1960   Date:04/26/2018       Progress Note  Subjective  Chief Complaint  Chief Complaint  Patient presents with  . Sinusitis    patient presents with sinus pressure and productive cough.   . Chills    patient has been feverish and has had joint pain  . Ear Fullness    bilateral ringing    HPI  Patient was seen 2 weeks ago for sinusitis and treated with Flonase, Claritin and doxycycline- states thinks she missed a day or two of the doxycycline just has one pill left. Headache and facial fullness has improved some but still presents. She notes increase post nasal drainage, constant rhinorrhea, post nasal drip and then occasionally getting thick yellow-brown mucus from nose and cough. Intermittent chills. Cough has worsened.   Patient Active Problem List   Diagnosis Date Noted  . Pelvic mass in female 01/11/2018  . Obesity (BMI 30.0-34.9) 08/16/2017  . Tobacco abuse 03/22/2017    Past Medical History:  Diagnosis Date  . History of kidney stones    still have 5 in right kidney  . Microscopic hematuria    hx of due to kidney stones  . Pelvic mass in female   . Tobacco abuse 03/22/2017    Past Surgical History:  Procedure Laterality Date  . back syugery  1993   lower lumbar  . Benign tumor  1993  . BUNIONECTOMY Left 2006  . LITHOTRIPSY    . ROBOTIC ASSISTED BILATERAL SALPINGO OOPHERECTOMY Bilateral 02/06/2018   Procedure: XI ROBOTIC ASSISTED BILATERAL SALPINGO OOPHORECTOMY , PELVIC WASHINGS;  Surgeon: Isabel Caprice, MD;  Location: [facility];  Service: Gynecology;  Laterality: Bilateral;  . 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 Smoking status: Current Every Day Smoker    Packs/day: 1.00    Years: 40.00    Pack years: 40.00    Types: Cigarettes  . Smokeless tobacco: Never Used  Substance Use Topics  . Alcohol use: No     Current Outpatient  Medications:  .  acetaminophen (TYLENOL) 500 MG tablet, Take 1,000-1,500 mg by mouth every 6 (six) hours as needed for moderate pain or headache. , Disp: , Rfl:  .  cholecalciferol (VITAMIN D) 1000 UNITS tablet, Take 1,000 Units by mouth daily. , Disp: , Rfl:  .  fluticasone (FLONASE) 50 MCG/ACT nasal spray, Place 2 sprays into both nostrils daily., Disp: 16 g, Rfl: 6 .  ibuprofen (ADVIL,MOTRIN) 800 MG tablet, Take 1 tablet (800 mg total) by mouth every 8 (eight) hours as needed., Disp: 30 tablet, Rfl: 0 .  polyethylene glycol (MIRALAX / GLYCOLAX) packet, Take 17 g by mouth daily as needed for mild constipation. , Disp: , Rfl:  .  loratadine (CLARITIN) 10 MG tablet, Take 1 tablet (10 mg total) by mouth daily. (Patient not taking: Reported on 04/26/2018), Disp: 30 tablet, Rfl: 11  Allergies  Allergen Reactions  . Oxycodone Nausea And Vomiting  . Penicillins Rash and Other (See Comments)    Childhood allergy rash after 3 days. No SOB, no hospitilization Has patient had a PCN reaction causing immediate rash, facial/tongue/throat swelling, SOB or lightheadedness with hypotension: No Has patient had a PCN reaction causing severe rash involving mucus membranes or skin necrosis: No Has patient had a PCN reaction that required hospitalization: No Has patient had a PCN  reaction occurring within the last 10 years: No If all of the above answers are "NO", then may proceed with Cephalos    ROS   No other specific complaints in a complete review of systems (except as listed in HPI above).  Objective  Vitals:   04/26/18 1617  BP: 102/60  Pulse: 90  Resp: 16  Temp: 98.2 F (36.8 C)  TempSrc: Oral  SpO2: 99%  Weight: 182 lb 8 oz (82.8 kg)  Height: 5\' 9"  (1.753 m)    Body mass index is 26.95 kg/m.  Nursing Note and Vital Signs reviewed.  Physical Exam Constitutional:      Appearance: She is not ill-appearing.  HENT:     Head: Normocephalic and atraumatic.     Right Ear: Hearing,  tympanic membrane, ear canal and external ear normal.     Left Ear: Hearing, tympanic membrane, ear canal and external ear normal.     Nose: Congestion and rhinorrhea present.     Right Sinus: Frontal sinus tenderness present. No maxillary sinus tenderness.     Left Sinus: No maxillary sinus tenderness or frontal sinus tenderness.     Mouth/Throat:     Mouth: Mucous membranes are moist.     Pharynx: Uvula midline. No oropharyngeal exudate or posterior oropharyngeal erythema.  Eyes:     General:        Right eye: No discharge.        Left eye: No discharge.     Conjunctiva/sclera: Conjunctivae normal.  Neck:     Musculoskeletal: Normal range of motion.  Cardiovascular:     Rate and Rhythm: Normal rate and regular rhythm.  Pulmonary:     Effort: Pulmonary effort is normal.     Breath sounds: Normal breath sounds.  Lymphadenopathy:     Cervical: No cervical adenopathy.  Skin:    General: Skin is warm and dry.     Findings: No rash.  Neurological:     Mental Status: She is alert.  Psychiatric:        Judgment: Judgment normal.      No results found for this or any previous visit (from the past 48 hour(s)).  Assessment & Plan  1. Cough - benzonatate (TESSALON) 100 MG capsule; Take 2 capsules (200 mg total) by mouth 3 (three) times daily as needed for cough.  Dispense: 30 capsule; Refill: 1 - dextromethorphan-guaiFENesin (MUCINEX DM) 30-600 MG 12hr tablet; Take 1 tablet by mouth 2 (two) times daily.  Dispense: 30 tablet; Refill: 0  2. Rhinorrhea - montelukast (SINGULAIR) 10 MG tablet; Take 1 tablet (10 mg total) by mouth at bedtime.  Dispense: 30 tablet; Refill: 0  3. Acute non-recurrent pansinusitis Facial pressure resolving, consider viral in nature, take medications as prescribed if no improvement in 3 days will send in another antibiotic and/or refer to ENT - montelukast (SINGULAIR) 10 MG tablet; Take 1 tablet (10 mg total) by mouth at bedtime.  Dispense: 30 tablet;  Refill: 0  4. Screening for breast cancer - MM Digital Screening; Future

## 2018-04-28 ENCOUNTER — Telehealth: Payer: Self-pay

## 2018-04-28 DIAGNOSIS — J0101 Acute recurrent maxillary sinusitis: Secondary | ICD-10-CM

## 2018-04-28 DIAGNOSIS — J3489 Other specified disorders of nose and nasal sinuses: Secondary | ICD-10-CM

## 2018-04-28 DIAGNOSIS — R0982 Postnasal drip: Secondary | ICD-10-CM

## 2018-04-28 MED ORDER — CEFDINIR 300 MG PO CAPS: 300.0000 mg | ORAL_CAPSULE | Freq: Two times a day (BID) | ORAL | 0 refills | Status: DC

## 2018-04-28 MED ORDER — SALINE SPRAY 0.65 % NA SOLN: 1.0000 | NASAL | 2 refills | Status: DC | PRN

## 2018-04-28 NOTE — Telephone Encounter (Signed)
Please call. ENT Referral sent. I would like her to do a saline rinse twice a day. Use flonase 1 spray in each nostril twice a day. Continue Claritin daily. Take the antibiotic every 12 hours for the entire course- set alarm in her phone so she does not miss any doses. Take Singulair daily at bedtime. Make sure to take the antibiotic with food. I do recommend taking a probiotic to replenish her good gut health.  This antibiotic is not a penicillin but has a small chance of cross-sensitivity so please report any side-effects immediately. Please do eat yogurt daily or take a probiotic daily for the next month or two We want to replace the healthy germs in the gut If you notice foul, watery diarrhea in the next two months, schedule an appointment RIGHT AWAY

## 2018-04-28 NOTE — Telephone Encounter (Signed)
Patient was informed and said ok and thanks.  Copied from [facility] [phone]. Topic: General - Other >> Apr 28, 2018  8:51 AM Antonieta Iba C wrote: Reason for CRM: pt called in to make provider aware that she is still not feeling better. Pt says that she was told by provider Raquel Sarna that if she's not feeling better she would like an update. Pt says that she was told that she would go forward with a ENT referral and also call pt in an antibiotic to help.   Please advise  Pharmacy: [id number] - [place], [place] [phone] (Phone) [phone] (Fax)

## 2018-05-01 ENCOUNTER — Ambulatory Visit: Payer: BLUE CROSS/BLUE SHIELD | Admitting: Nurse Practitioner

## 2018-05-01 ENCOUNTER — Ambulatory Visit
Admission: RE | Admit: 2018-05-01 | Discharge: 2018-05-01 | Disposition: A | Discharge status: Home / Self Care | Payer: BLUE CROSS/BLUE SHIELD | Source: Ambulatory Visit | Attending: Nurse Practitioner | Admitting: Nurse Practitioner

## 2018-05-01 ENCOUNTER — Encounter: Payer: Self-pay | Admitting: Nurse Practitioner

## 2018-05-01 ENCOUNTER — Other Ambulatory Visit
Admission: RE | Admit: 2018-05-01 | Discharge: 2018-05-01 | Disposition: A | Discharge status: Home / Self Care | Payer: BLUE CROSS/BLUE SHIELD | Source: Ambulatory Visit | Attending: Nurse Practitioner | Admitting: Nurse Practitioner

## 2018-05-01 VITALS — BP 128/68 | HR 81 | Temp 97.9°F | Resp 16 | Ht 69.0 in | Wt 182.8 lb

## 2018-05-01 DIAGNOSIS — R109 Unspecified abdominal pain: Secondary | ICD-10-CM | POA: Diagnosis not present

## 2018-05-01 DIAGNOSIS — R319 Hematuria, unspecified: Secondary | ICD-10-CM

## 2018-05-01 DIAGNOSIS — R809 Proteinuria, unspecified: Secondary | ICD-10-CM

## 2018-05-01 DIAGNOSIS — J40 Bronchitis, not specified as acute or chronic: Secondary | ICD-10-CM

## 2018-05-01 DIAGNOSIS — J0101 Acute recurrent maxillary sinusitis: Secondary | ICD-10-CM

## 2018-05-01 DIAGNOSIS — R310 Gross hematuria: Secondary | ICD-10-CM | POA: Diagnosis not present

## 2018-05-01 DIAGNOSIS — R82998 Other abnormal findings in urine: Secondary | ICD-10-CM

## 2018-05-01 LAB — BASIC METABOLIC PANEL
Anion gap: 8 (ref 5–15)
BUN: 22 mg/dL — AB (ref 6–20)
CO2: 27 mmol/L (ref 22–32)
Calcium: 8.9 mg/dL (ref 8.9–10.3)
Chloride: 104 mmol/L (ref 98–111)
Creatinine, Ser: 0.53 mg/dL (ref 0.44–1.00)
GFR calc Af Amer: >60 mL/min (ref >60)
GFR calc non Af Amer: >60 mL/min (ref >60)
Glucose, Bld: 98 mg/dL (ref 70–99)
Potassium: 3.3 mmol/L — ABNORMAL LOW (ref 3.5–5.1)
Sodium: 139 mmol/L (ref 135–145)

## 2018-05-01 LAB — POCT URINALYSIS DIPSTICK
BILIRUBIN UA: NEGATIVE
Glucose, UA: NEGATIVE
Ketones, UA: NEGATIVE
Nitrite, UA: NEGATIVE
Protein, UA: POSITIVE — AB
Spec Grav, UA: >=1.03 — AB (ref 1.010–1.025)
Urobilinogen, UA: 0.2 E.U./dL
pH, UA: 6 (ref 5.0–8.0)

## 2018-05-01 MED ORDER — ALBUTEROL SULFATE HFA 108 (90 BASE) MCG/ACT IN AERS: 2.0000 | INHALATION_SPRAY | Freq: Four times a day (QID) | RESPIRATORY_TRACT | 0 refills | Status: DC | PRN

## 2018-05-01 MED ORDER — TAMSULOSIN HCL 0.4 MG PO CAPS: 0.4000 mg | ORAL_CAPSULE | Freq: Every day | ORAL | 0 refills | Status: DC

## 2018-05-01 NOTE — Progress Notes (Signed)
Name: Sharon Wright   MRN: [id number]    DOB: November 14, 1960   Date:05/01/2018       Progress Note  Subjective  Chief Complaint  Chief Complaint  Patient presents with  . Urinary Tract Infection    HPI  Patient was treated for UTI on 04/09/2018 via telehealth with cipro then for sinus infection on 04/14/2018 with doxycycline 100mg  BID x 10 days; patient had missed a few days but completed course, UTI symptoms resolved but patients sinus symptoms had not so she was given omnicef 300mg  BID x 10 days on 04/28/2018 and referred to ENT. Has history of kidney stones, last renal US on 02/21/2018- was negative for calculus. Patient presents today for tea colored urine- noticed first this morning. Denies urinary frequency, foam in urine. States has had some bright red blood. Endorses lower back pain- chronic. Patient endorses bilateral flank pain that has been ongoing for a week or so. Denies myopathies. No fevers or chills, nausea, vomiting. States woke up Friday and noticed muffled sounds and decrease smell and taste, states pain and pressure is improved. States cough is still strong and congested.   Patient Active Problem List   Diagnosis Date Noted  . Pelvic mass in female 01/11/2018  . Obesity (BMI 30.0-34.9) 08/16/2017  . Tobacco abuse 03/22/2017    Past Medical History:  Diagnosis Date  . History of kidney stones    still have 5 in right kidney  . Microscopic hematuria    hx of due to kidney stones  . Pelvic mass in female   . Tobacco abuse 03/22/2017    Past Surgical History:  Procedure Laterality Date  . back syugery  1993   lower lumbar  . Benign tumor  1993  . BUNIONECTOMY Left 2006  . LITHOTRIPSY    . ROBOTIC ASSISTED BILATERAL SALPINGO OOPHERECTOMY Bilateral 02/06/2018   Procedure: XI ROBOTIC ASSISTED BILATERAL SALPINGO OOPHORECTOMY , PELVIC WASHINGS;  Surgeon: Isabel Caprice, MD;  Location: [facility];  Service: Gynecology;  Laterality: Bilateral;  . 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 Smoking status: Current Every Day Smoker    Packs/day: 1.00    Years: 40.00    Pack years: 40.00    Types: Cigarettes  . Smokeless tobacco: Never Used  Substance Use Topics  . Alcohol use: No     Current Outpatient Medications:  .  acetaminophen (TYLENOL) 500 MG tablet, Take 1,000-1,500 mg by mouth every 6 (six) hours as needed for moderate pain or headache. , Disp: , Rfl:  .  benzonatate (TESSALON) 100 MG capsule, Take 2 capsules (200 mg total) by mouth 3 (three) times daily as needed for cough., Disp: 30 capsule, Rfl: 1 .  cefdinir (OMNICEF) 300 MG capsule, Take 1 capsule (300 mg total) by mouth 2 (two) times daily., Disp: 20 capsule, Rfl: 0 .  cholecalciferol (VITAMIN D) 1000 UNITS tablet, Take 1,000 Units by mouth daily. , Disp: , Rfl:  .  dextromethorphan-guaiFENesin (MUCINEX DM) 30-600 MG 12hr tablet, Take 1 tablet by mouth 2 (two) times daily., Disp: 30 tablet, Rfl: 0 .  fluticasone (FLONASE) 50 MCG/ACT nasal spray, Place 2 sprays into both nostrils daily., Disp: 16 g, Rfl: 6 .  ibuprofen (ADVIL,MOTRIN) 800 MG tablet, Take 1 tablet (800 mg total) by mouth every 8 (eight) hours as needed., Disp: 30 tablet, Rfl: 0 .  loratadine (CLARITIN) 10 MG tablet, Take 1  tablet (10 mg total) by mouth daily. (Patient not taking: Reported on 04/26/2018), Disp: 30 tablet, Rfl: 11 .  montelukast (SINGULAIR) 10 MG tablet, Take 1 tablet (10 mg total) by mouth at bedtime., Disp: 30 tablet, Rfl: 0 .  polyethylene glycol (MIRALAX / GLYCOLAX) packet, Take 17 g by mouth daily as needed for mild constipation. , Disp: , Rfl:  .  sodium chloride (OCEAN) 0.65 % SOLN nasal spray, Place 1 spray into both nostrils as needed for congestion., Disp: 60 mL, Rfl: 2  Allergies  Allergen Reactions  . Oxycodone Nausea And Vomiting  . Penicillins Rash and Other (See Comments)    Childhood allergy rash after 3 days. No SOB, no  hospitilization Has patient had a PCN reaction causing immediate rash, facial/tongue/throat swelling, SOB or lightheadedness with hypotension: No Has patient had a PCN reaction causing severe rash involving mucus membranes or skin necrosis: No Has patient had a PCN reaction that required hospitalization: No Has patient had a PCN reaction occurring within the last 10 years: No If all of the above answers are "NO", then may proceed with Cephalos    ROS   No other specific complaints in a complete review of systems (except as listed in HPI above).  Objective  Vitals:   05/01/18 1558  BP: 128/68  Pulse: 81  Resp: 16  Temp: 97.9 F (36.6 C)  TempSrc: Oral  SpO2: 99%  Weight: 182 lb 12.8 oz (82.9 kg)  Height: 5\' 9"  (1.753 m)     Body mass index is 26.99 kg/m.  Nursing Note and Vital Signs reviewed.  Physical Exam Vitals signs reviewed.  Constitutional:      Appearance: She is well-developed.  HENT:     Head: Normocephalic and atraumatic.     Mouth/Throat:     Mouth: Mucous membranes are dry.  Neck:     Musculoskeletal: Normal range of motion and neck supple.     Vascular: No carotid bruit.  Cardiovascular:     Heart sounds: Normal heart sounds.  Pulmonary:     Effort: Pulmonary effort is normal.     Breath sounds: Wheezing (expiratory wheezing bilaterally at lower bases) present.  Abdominal:     General: Bowel sounds are normal.     Palpations: Abdomen is soft.     Tenderness: There is no abdominal tenderness. There is no right CVA tenderness or left CVA tenderness.  Musculoskeletal: Normal range of motion.  Skin:    General: Skin is warm and dry.     Capillary Refill: Capillary refill takes less than 2 seconds.  Neurological:     Mental Status: She is alert and oriented to person, place, and time.     GCS: GCS eye subscore is 4. GCS verbal subscore is 5. GCS motor subscore is 6.     Sensory: No sensory deficit.  Psychiatric:        Speech: Speech normal.         Behavior: Behavior normal.        Thought Content: Thought content normal.        Judgment: Judgment normal.      Results for orders placed or performed in visit on 05/01/18 (from the past 48 hour(s))  POCT Urinalysis Dipstick     Status: Abnormal   Collection Time: 05/01/18  3:56 PM  Result Value Ref Range   Color, UA RED    Clarity, UA DARK    Glucose, UA Negative Negative   Bilirubin, UA NEGATIVE  Ketones, UA NEGATIVE    Spec Grav, UA >=1.030 (A) 1.010 - 1.025   Blood, UA LARGE    pH, UA 6.0 5.0 - 8.0   Protein, UA Positive (A) Negative   Urobilinogen, UA 0.2 0.2 or 1.0 E.U./dL   Nitrite, UA NEGATIVE    Leukocytes, UA Trace (A) Negative   Appearance CLOUDY    Odor SLIGHT     Assessment & Plan  1. Hematuria, unspecified type Trace leukocytes in urine and no dysuria- to explain sudden hematuria- more likely kidney stone, will also check cytology for other causes for persistent hematuria. Patient has urology appointment in 6 weeks- will call to move up; will reach out to provider thinks it may be Dr. Lovena Neighbours with [facility]. ER precautions discussed.  - POCT Urinalysis Dipstick - Urinalysis, Routine w reflex microscopic - Urine Culture - Basic Metabolic Panel (BMET); Future - tamsulosin (FLOMAX) 0.4 MG CAPS capsule; Take 1 capsule (0.4 mg total) by mouth daily.  Dispense: 30 capsule; Refill: 0 - Cytology - non gyn; Future - DG Abd 1 View; Future  2. Leukocytes in urine - Urine Culture - Basic Metabolic Panel (BMET); Future  3. Bilateral flank pain Follow up with urology  - Basic Metabolic Panel (BMET); Future - DG Abd 1 View; Future  4. Acute recurrent maxillary sinusitis Follow up with ENT, continue current therapies  5. Bronchitis Continue cough suppressant. Stop smoking  - albuterol (PROVENTIL HFA;VENTOLIN HFA) 108 (90 Base) MCG/ACT inhaler; Inhale 2 puffs into the lungs every 6 (six) hours as needed for wheezing or shortness of breath.  Dispense: 1  Inhaler; Refill: 0  6. Proteinuria, unspecified type - Basic Metabolic Panel (BMET); Future    -Red flags and when to present for emergency care or RTC including fever >101.14F, chest pain, shortness of breath, unable to urinate, or urinating small amounts, nausea, vomiting, weakness new/worsening/un-resolving symptoms,  reviewed with patient at time of visit. Follow up and care instructions discussed and provided in AVS.

## 2018-05-01 NOTE — Patient Instructions (Addendum)
-   [facility] to [facility] blood test, urine and xray.  - Please call urology to move up appointment  - Drink lots of water at least 70 ounces, avoid caffeine, take flomax once daily for the next week.  - Please go to the ER if you have a fever >101.13F, chest pain, shortness of breath, unable to urinate, or urinating small amounts, nausea, vomiting, weakness new/worsening/un-resolving symptoms

## 2018-05-02 ENCOUNTER — Other Ambulatory Visit: Payer: Self-pay

## 2018-05-02 ENCOUNTER — Other Ambulatory Visit: Payer: Self-pay | Admitting: Nurse Practitioner

## 2018-05-02 DIAGNOSIS — R310 Gross hematuria: Secondary | ICD-10-CM

## 2018-05-02 LAB — URINALYSIS, ROUTINE W REFLEX MICROSCOPIC
Bacteria, UA: NONE SEEN /HPF
Bilirubin Urine: NEGATIVE
Glucose, UA: NEGATIVE
Hyaline Cast: NONE SEEN /LPF
Ketones, ur: NEGATIVE
Nitrite: NEGATIVE
SPECIFIC GRAVITY, URINE: 1.02 (ref 1.001–1.03)
Squamous Epithelial / HPF: NONE SEEN /HPF (ref <=5)
pH: 6 (ref 5.0–8.0)

## 2018-05-02 LAB — URINE CULTURE
MICRO NUMBER:: 264924
Result:: NO GROWTH
SPECIMEN QUALITY:: ADEQUATE

## 2018-05-03 ENCOUNTER — Telehealth: Payer: Self-pay | Admitting: Nurse Practitioner

## 2018-05-03 ENCOUNTER — Ambulatory Visit: Payer: BLUE CROSS/BLUE SHIELD | Admitting: Nurse Practitioner

## 2018-05-03 NOTE — Telephone Encounter (Signed)
Called patient to discuss xray, urine and blood results. All questions answered. Referral to ENT and urology discussed. Patient is taking medications as prescribed, overall feeling okay- still unable to hear well, taste or smell.

## 2018-05-04 ENCOUNTER — Ambulatory Visit: Payer: BLUE CROSS/BLUE SHIELD | Admitting: Family Medicine

## 2018-05-08 ENCOUNTER — Ambulatory Visit: Payer: BLUE CROSS/BLUE SHIELD | Admitting: Urology

## 2018-05-08 ENCOUNTER — Other Ambulatory Visit: Payer: Self-pay

## 2018-05-08 ENCOUNTER — Encounter: Payer: Self-pay | Admitting: Urology

## 2018-05-08 VITALS — BP 137/83 | HR 101 | Ht 69.0 in | Wt 182.0 lb

## 2018-05-08 DIAGNOSIS — R319 Hematuria, unspecified: Secondary | ICD-10-CM

## 2018-05-08 DIAGNOSIS — R310 Gross hematuria: Secondary | ICD-10-CM | POA: Diagnosis not present

## 2018-05-08 LAB — URINALYSIS, COMPLETE
Bilirubin, UA: NEGATIVE
Glucose, UA: NEGATIVE
KETONES UA: NEGATIVE
Leukocytes, UA: NEGATIVE
Nitrite, UA: NEGATIVE
Protein, UA: NEGATIVE
Specific Gravity, UA: >1.03 — ABNORMAL HIGH (ref 1.005–1.030)
Urobilinogen, Ur: 0.2 mg/dL (ref 0.2–1.0)
pH, UA: 5.5 (ref 5.0–7.5)

## 2018-05-08 LAB — MICROSCOPIC EXAMINATION: WBC, UA: NONE SEEN /hpf (ref 0–5)

## 2018-05-08 NOTE — Progress Notes (Signed)
05/08/2018 10:37 AM   [name] July 1960/07/16 [id number]  Referring provider: Arnetha Courser, MD [address] [place], [place] [address]  Chief Complaint  Patient presents with  . Hematuria    new patient    HPI: Was consulted to assess the patient's 2-week history of gross hematuria.  She has nonspecific back and side pain bilaterally.  She has had lithotripsy and treatments for kidney stones in the past in [place].  She was not having pain.  She has a smoking history.  She takes a lot of ibuprofen.  She does not take aspirin or blood thinners.  She has had a previous bladder operation  She has mild urge incontinence if she holds it too long and since the bleeding started to wear a pad.  She gets up at least once a night and voids only a few times a day  She describes a right ovarian cyst being recently removed but no hysterectomy.  She recently underwent a robotic assisted laparoscopic bilateral removal of her tubes and ovaries and it was benign.  Recent urine culture negative  Modifying factors: There are no other modifying factors  Associated signs and symptoms: There are no other associated signs and symptoms Aggravating and relieving factors: There are no other aggravating or relieving factors Severity: Moderate Duration: Persistent   PMH: Past Medical History:  Diagnosis Date  . History of kidney stones    still have 5 in right kidney  . Microscopic hematuria    hx of due to kidney stones  . Pelvic mass in female   . Tobacco abuse 03/22/2017    Surgical History: Past Surgical History:  Procedure Laterality Date  . back syugery  1993   lower lumbar  . Benign tumor  1993  . BUNIONECTOMY Left 2006  . LITHOTRIPSY    . ROBOTIC ASSISTED BILATERAL SALPINGO OOPHERECTOMY Bilateral 02/06/2018   Procedure: XI ROBOTIC ASSISTED BILATERAL SALPINGO OOPHORECTOMY , PELVIC WASHINGS;  Surgeon: Isabel Caprice, MD;  Location: [facility];   Service: Gynecology;  Laterality: Bilateral;  . 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 No SOB, no hospitilization Has patient had a PCN reaction causing immediate rash, facial/tongue/throat swelling, SOB or lightheadedness with hypotension: No Has patient had a PCN reaction causing severe rash involving mucus membranes or skin necrosis: No Has patient had a PCN reaction that required hospitalization: No Has patient had a PCN reaction occurring within the last 10 years: No If all of the above answers are "NO", then may proceed with Cephalos      Medication List       Accurate as of May 08, 2018 10:37 AM. Always use your most recent med list.        acetaminophen 500 MG tablet Commonly known as:  TYLENOL Take 1,000-1,500 mg by mouth every 6 (six) hours as needed for moderate pain or headache.   albuterol 108 (90 Base) MCG/ACT inhaler Commonly known as:  PROVENTIL HFA;VENTOLIN HFA Inhale 2 puffs into the lungs every 6 (six) hours as needed for wheezing or shortness of breath.   benzonatate 100 MG capsule Commonly known as:  TESSALON Take 2 capsules (200 mg total) by mouth 3 (three) times daily as needed for cough.  cefdinir 300 MG capsule Commonly known as:  OMNICEF Take 1 capsule (300 mg total) by mouth 2 (two) times daily.   cholecalciferol 1000 units tablet Commonly known as:  VITAMIN D Take 1,000 Units by mouth daily.   dextromethorphan-guaiFENesin 30-600 MG 12hr tablet Commonly known as:  MUCINEX DM Take 1 tablet by mouth 2 (two) times daily.   fluticasone 50 MCG/ACT nasal spray Commonly known as:  FLONASE Place 2 sprays into both nostrils daily.   ibuprofen 800 MG tablet Commonly known as:  ADVIL,MOTRIN Take 1 tablet (800 mg total) by mouth every 8 (eight) hours as needed.    loratadine 10 MG tablet Commonly known as:  CLARITIN Take 1 tablet (10 mg total) by mouth daily.   montelukast 10 MG tablet Commonly known as:  SINGULAIR Take 1 tablet (10 mg total) by mouth at bedtime.   polyethylene glycol packet Commonly known as:  MIRALAX / GLYCOLAX Take 17 g by mouth daily as needed for mild constipation.   sodium chloride 0.65 % Soln nasal spray Commonly known as:  OCEAN Place 1 spray into both nostrils as needed for congestion.   tamsulosin 0.4 MG Caps capsule Commonly known as:  FLOMAX Take 1 capsule (0.4 mg total) by mouth daily.       Allergies:  Allergies  Allergen Reactions  . Oxycodone Nausea And Vomiting  . Penicillins Rash and Other (See Comments)    Childhood allergy rash after 3 days. No SOB, no hospitilization Has patient had a PCN reaction causing immediate rash, facial/tongue/throat swelling, SOB or lightheadedness with hypotension: No Has patient had a PCN reaction causing severe rash involving mucus membranes or skin necrosis: No Has patient had a PCN reaction that required hospitalization: No Has patient had a PCN reaction occurring within the last 10 years: No If all of the above answers are "NO", then may proceed with Cephalos    Family History: Family History  Problem Relation Age of Onset  . Congestive Heart Failure Mother   . Heart disease Mother   . Uterine cancer Mother   . Diabetes Father   . Heart disease Father   . Dementia Father   . Heart disease Sister   . Diabetes Sister     Social History:  reports that she has been smoking cigarettes. She has a 40.00 pack-year smoking history. She has never used smokeless tobacco. She reports that she does not drink alcohol or use drugs.  ROS: UROLOGY Frequent Urination?: No Hard to postpone urination?: No Burning/pain with urination?: No Get up at night to urinate?: No Leakage of urine?: No Urine stream starts and stops?: No Trouble starting stream?: No Do you have  to strain to urinate?: No Blood in urine?: Yes Urinary tract infection?: No Sexually transmitted disease?: No Injury to kidneys or bladder?: No Painful intercourse?: No Weak stream?: No Currently pregnant?: No Vaginal bleeding?: No Last menstrual period?: n  Gastrointestinal Nausea?: No Vomiting?: No Indigestion/heartburn?: No Diarrhea?: No Constipation?: No  Constitutional Fever: No Night sweats?: No Weight loss?: No Fatigue?: No  Skin Skin rash/lesions?: No Itching?: No  Eyes Blurred vision?: No Double vision?: No  Ears/Nose/Throat Sore throat?: No Sinus problems?: No  Hematologic/Lymphatic Swollen glands?: No Easy bruising?: No  Cardiovascular Leg swelling?: No Chest pain?: No  Respiratory Cough?: No Shortness of breath?: No  Endocrine Excessive thirst?: No  Musculoskeletal Back pain?: Yes Joint pain?: No  Neurological Headaches?: No Dizziness?: No  Psychologic Depression?: No Anxiety?: No  Physical Exam: BP 137/83 (BP  Location: Left Arm, Patient Position: Sitting)   Pulse (!) 101   Ht 5\' 9"  (1.753 m)   Wt 182 lb (82.6 kg)   BMI 26.88 kg/m   Constitutional:  Alert and oriented, No acute distress. HEENT: [place] AT, moist mucus membranes.  Trachea midline, no masses. Cardiovascular: No clubbing, cyanosis, or edema. Respiratory: Normal respiratory effort, no increased work of breathing. GI: Abdomen is soft, nontender, nondistended, no abdominal masses GU: No CVA tenderness.  No bladder tenderness Skin: No rashes, bruises or suspicious lesions. Lymph: No cervical or inguinal adenopathy. Neurologic: Grossly intact, no focal deficits, moving all 4 extremities. Psychiatric: Normal mood and affect.  Laboratory Data: Lab Results  Component Value Date   WBC 11.2 (H) 02/01/2018   HGB 15.6 (H) 02/01/2018   HCT 47.2 (H) 02/01/2018   MCV 93.3 02/01/2018   PLT 234 02/01/2018    Lab Results  Component Value Date   CREATININE 0.53 05/01/2018      No results found for: PSA  No results found for: TESTOSTERONE  No results found for: HGBA1C  Urinalysis    Component Value Date/Time   COLORURINE AMBER (A) 05/01/2018 0000   APPEARANCEUR CLOUDY (A) 05/01/2018 0000   LABSPEC 1.020 05/01/2018 0000   PHURINE 6.0 05/01/2018 0000   GLUCOSEU NEGATIVE 05/01/2018 0000   HGBUR 3+ (A) 05/01/2018 0000   BILIRUBINUR NEGATIVE 05/01/2018 1556   KETONESUR NEGATIVE 05/01/2018 0000   PROTEINUR Positive (A) 05/01/2018 1556   PROTEINUR 1+ (A) 05/01/2018 0000   UROBILINOGEN 0.2 05/01/2018 1556   NITRITE NEGATIVE 05/01/2018 1556   NITRITE NEGATIVE 05/01/2018 0000   LEUKOCYTESUR Trace (A) 05/01/2018 1556   LEUKOCYTESUR 1+ (A) 05/01/2018 0000    Pertinent Imaging: CT scan June 2019 demonstrated a soft tissue mass of the right adnexa.  Renal ultrasound in December 2019 demonstrated left renal cyst.  Assessment & Plan: The work-up for gross hematuria described.  Order CT scan and patient return for cystoscopy  1. Hematuria, unspecified type  - Urinalysis, Complete   No follow-ups on file.  Reece Packer, MD  [facility] [address], [place], [place] [address] [phone]

## 2018-05-08 NOTE — Addendum Note (Signed)
Addended by: Crissie Figures R on: 05/08/2018 01:06 PM   Modules accepted: Orders

## 2018-05-12 LAB — CULTURE, URINE COMPREHENSIVE

## 2018-05-16 ENCOUNTER — Telehealth: Payer: Self-pay | Admitting: Family Medicine

## 2018-05-16 MED ORDER — NITROFURANTOIN MACROCRYSTAL 100 MG PO CAPS: 100.0000 mg | ORAL_CAPSULE | Freq: Two times a day (BID) | ORAL | 0 refills | Status: DC

## 2018-05-16 NOTE — Telephone Encounter (Signed)
-----   Message from Chrystie Nose, [place] sent at 05/15/2018 10:01 AM EDT -----  ----- Message ----- From: Bjorn Loser, MD Sent: 05/14/2018  11:23 AM EDT To: Chrystie Nose, CMA  Macrodantin 100 mg bid for  days          ----- Message ----- From: Chrystie Nose, CMA Sent: 05/10/2018   8:16 AM EDT To: Bjorn Loser, MD   ----- Message ----- From: Interface, [facility] Lab Results In Sent: 05/08/2018   1:36 PM EDT To: Rowe [facility]

## 2018-05-16 NOTE — Addendum Note (Signed)
Addended by: Kyra Manges on: 05/16/2018 08:35 AM   Modules accepted: Orders

## 2018-05-16 NOTE — Telephone Encounter (Signed)
Macrodantin sent to pharmacy. Attempted to notify patient, mailbox is full. Medication was sent to [facility]

## 2018-05-17 ENCOUNTER — Ambulatory Visit
Admission: RE | Admit: 2018-05-17 | Discharge: 2018-05-17 | Disposition: A | Discharge status: Home / Self Care | Payer: BLUE CROSS/BLUE SHIELD | Source: Ambulatory Visit | Attending: Urology | Admitting: Urology

## 2018-05-17 ENCOUNTER — Other Ambulatory Visit: Payer: Self-pay

## 2018-05-17 DIAGNOSIS — N200 Calculus of kidney: Secondary | ICD-10-CM | POA: Diagnosis not present

## 2018-05-17 DIAGNOSIS — R310 Gross hematuria: Secondary | ICD-10-CM | POA: Insufficient documentation

## 2018-05-17 MED ORDER — IOHEXOL 300 MG/ML  SOLN
125.0000 mL | Freq: Once | INTRAMUSCULAR | Status: AC | PRN
  Administered 2018-05-17: 125 mL via INTRAVENOUS

## 2018-05-22 ENCOUNTER — Ambulatory Visit: Payer: BLUE CROSS/BLUE SHIELD | Admitting: Urology

## 2018-05-22 ENCOUNTER — Other Ambulatory Visit: Payer: BLUE CROSS/BLUE SHIELD | Admitting: Urology

## 2018-05-22 ENCOUNTER — Other Ambulatory Visit: Payer: Self-pay

## 2018-05-22 ENCOUNTER — Telehealth: Payer: Self-pay | Admitting: Radiology

## 2018-05-22 ENCOUNTER — Other Ambulatory Visit: Payer: Self-pay | Admitting: Radiology

## 2018-05-22 ENCOUNTER — Encounter: Payer: Self-pay | Admitting: Urology

## 2018-05-22 ENCOUNTER — Encounter: Payer: Self-pay | Admitting: Radiology

## 2018-05-22 VITALS — BP 118/76 | HR 74 | Wt 182.5 lb

## 2018-05-22 DIAGNOSIS — N200 Calculus of kidney: Secondary | ICD-10-CM

## 2018-05-22 DIAGNOSIS — R319 Hematuria, unspecified: Secondary | ICD-10-CM

## 2018-05-22 DIAGNOSIS — R896 Abnormal cytological findings in specimens from other organs, systems and tissues: Secondary | ICD-10-CM | POA: Diagnosis not present

## 2018-05-22 LAB — URINALYSIS, COMPLETE
Bilirubin, UA: NEGATIVE
Glucose, UA: NEGATIVE
Ketones, UA: NEGATIVE
LEUKOCYTES UA: NEGATIVE
Nitrite, UA: NEGATIVE
Specific Gravity, UA: >1.03 — ABNORMAL HIGH (ref 1.005–1.030)
Urobilinogen, Ur: 0.2 mg/dL (ref 0.2–1.0)
pH, UA: 6 (ref 5.0–7.5)

## 2018-05-22 LAB — MICROSCOPIC EXAMINATION
Bacteria, UA: NONE SEEN
WBC, UA: NONE SEEN /hpf (ref 0–5)

## 2018-05-22 NOTE — Progress Notes (Signed)
Cystoscopy Procedure Note:  Indication: gross hematuria  After informed consent and discussion of the procedure and its risks, Sharon Wright was positioned and prepped in the standard fashion. Cystoscopy was performed with a flexible cystoscope. Visualization was poor secondary to hematuria, and urine was aspirated to improve vision. The urethra, bladder neck and entire bladder was visualized in a standard fashion. The bladder mucosa was grossly normal throughout, no abnormalities on retroflexion. The ureteral orifices were visualized in their normal location and orientation.  Wash cytology sent.  Imaging: CTU personally reviewed, no hydronephrosis, filling defects, or renal masses. Right 65mm lower pole stone, 850HU, 9cm SSD. This stone is new since CT 07/2017.  Findings: Normal cystoscopy  Assessment and Plan: We discussed various treatment options for urolithiasis including observation with or without medical expulsive therapy, shockwave lithotripsy (SWL), ureteroscopy and laser lithotripsy with stent placement, and percutaneous nephrolithotomy.  We discussed that management is based on stone size, location, density, patient co-morbidities, and patient preference.   SWL has a lower stone free rate in a single procedure, but also a lower complication rate compared to ureteroscopy and avoids a stent and associated stent related symptoms. Possible complications include renal hematoma, steinstrasse, and need for additional treatment.  Ureteroscopy with laser lithotripsy and stent placement has a higher stone free rate than SWL in a single procedure, however increased complication rate including possible infection, ureteral injury, bleeding, and stent related morbidity. Common stent related symptoms include dysuria, urgency/frequency, and flank pain.  After an extensive discussion of the risks and benefits of the above treatment options, the patient would like to proceed with R  SWL.  -Schedule RIGHT SWL in ~4 weeks secondary to current delay for elective cases due to COVID-19 pandemic. -Urine sent for culture -Call with cytology results  Nickolas Madrid, MD 05/22/2018

## 2018-05-22 NOTE — Telephone Encounter (Signed)
Notified patient of extracorporeal shockwave lithotripsy scheduled on 06/29/2018 at 6:15. Patient should arrive at that time to Same Day Surgery. Pre-op & discharge instructions for lithotripsy were provided to patient.    Advised patient to hold NSAIDS & aspirin products for 3 days prior to the procedure beginning on 06/26/2018.  Patient's questions were answered & she expresses understanding of these instructions.    Ranell Patrick, RN

## 2018-05-23 ENCOUNTER — Other Ambulatory Visit: Payer: Self-pay | Admitting: Nurse Practitioner

## 2018-05-23 DIAGNOSIS — J40 Bronchitis, not specified as acute or chronic: Secondary | ICD-10-CM

## 2018-05-25 ENCOUNTER — Other Ambulatory Visit: Payer: Self-pay | Admitting: Urology

## 2018-05-25 LAB — CULTURE, URINE COMPREHENSIVE

## 2018-05-28 ENCOUNTER — Other Ambulatory Visit: Payer: Self-pay | Admitting: Nurse Practitioner

## 2018-05-28 DIAGNOSIS — R319 Hematuria, unspecified: Secondary | ICD-10-CM

## 2018-06-22 ENCOUNTER — Encounter: Payer: Self-pay | Admitting: Family Medicine

## 2018-06-22 ENCOUNTER — Encounter: Payer: Self-pay | Admitting: Nurse Practitioner

## 2018-06-29 ENCOUNTER — Encounter: Admission: RE | Payer: Self-pay | Source: Home / Self Care

## 2018-06-29 ENCOUNTER — Other Ambulatory Visit: Payer: Self-pay

## 2018-06-29 ENCOUNTER — Ambulatory Visit: Payer: BLUE CROSS/BLUE SHIELD | Admitting: Urology

## 2018-06-29 ENCOUNTER — Ambulatory Visit: Admission: RE | Admit: 2018-06-29 | Payer: BLUE CROSS/BLUE SHIELD | Source: Home / Self Care | Admitting: Urology

## 2018-06-29 ENCOUNTER — Encounter: Payer: Self-pay | Admitting: Urology

## 2018-06-29 VITALS — BP 105/68 | HR 72 | Ht 69.0 in | Wt 178.4 lb

## 2018-06-29 DIAGNOSIS — R3911 Hesitancy of micturition: Secondary | ICD-10-CM

## 2018-06-29 DIAGNOSIS — R310 Gross hematuria: Secondary | ICD-10-CM

## 2018-06-29 DIAGNOSIS — N200 Calculus of kidney: Secondary | ICD-10-CM

## 2018-06-29 HISTORY — DX: Gross hematuria: R31.0

## 2018-06-29 LAB — URINALYSIS, COMPLETE
Bilirubin, UA: NEGATIVE
Glucose, UA: NEGATIVE
Ketones, UA: NEGATIVE
Leukocytes,UA: NEGATIVE
Nitrite, UA: NEGATIVE
Protein,UA: NEGATIVE
Specific Gravity, UA: 1.025 (ref 1.005–1.030)
Urobilinogen, Ur: 0.2 mg/dL (ref 0.2–1.0)
pH, UA: 5.5 (ref 5.0–7.5)

## 2018-06-29 LAB — MICROSCOPIC EXAMINATION: WBC, UA: NONE SEEN /hpf (ref 0–5)

## 2018-06-29 SURGERY — LITHOTRIPSY, ESWL
Anesthesia: Moderate Sedation | Laterality: Right

## 2018-06-29 NOTE — Progress Notes (Signed)
   06/29/2018 12:30 PM   DANISHA BRASSFIELD 06-Jul-1960 [id number]  Reason for visit: Follow up hematuria, right renal stone  HPI: I saw Ms. Schreurs back in urology clinic today for gross hematuria and known 8 mm right lower pole renal stone.  She initially presented with gross hematuria and CT urogram 05/17/2018 showed no filling defects, renal masses, or hydronephrosis, however there was an 8 mm non-obstructing right lower pole stone.  Cystoscopy was limited secondary to gross hematuria, however showed no obvious bladder tumors.  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  She has also had difficulty with recurrent urinary tract infections.  She reports her gross hematuria has resolved.  Prior to her cytology returning as atypical, we had planned for SWL in June for her non-obstructing right-sided stone.   Of note, she also has significant risk factors for bladder cancer including active smoking with a 40-pack-year history, as well as a history of employment in the [employer].  We had a long conversation about options moving forward.  I recommended further investigation of her atypical cytology with a cystoscopy under anesthesia with possible bladder biopsies, consideration of bilateral retrograde pyelograms and possible diagnostic ureteroscopy, as well as simultaneous treatment of her right-sided 8 mm nonobstructing stone with ureteroscopy/laser lithotripsy/stent placement.  We discussed the risks and benefits at length, and she is in agreement to proceed.  In the setting of the COVID-19 pandemic, we will plan for early June 2020 when we are able to schedule elective cases.  Cystoscopy, possible bladder biopsy, bilateral retrograde pyelograms, right ureteroscopy, laser lithotripsy, stent in early June  A total of 15 minutes were spent face-to-face with the patient, greater than 50% was spent in patient education, counseling, and coordination of  care regarding atypical cytology, right renal stone, and strategies for management.  [place], [facility] [address] [place], [place] [address] [phone]

## 2018-07-03 ENCOUNTER — Other Ambulatory Visit: Payer: Self-pay | Admitting: Radiology

## 2018-07-03 DIAGNOSIS — N200 Calculus of kidney: Secondary | ICD-10-CM

## 2018-07-13 ENCOUNTER — Ambulatory Visit: Payer: BLUE CROSS/BLUE SHIELD | Admitting: Urology

## 2018-07-18 ENCOUNTER — Other Ambulatory Visit: Payer: Self-pay | Admitting: Radiology

## 2018-07-28 ENCOUNTER — Encounter
Admission: RE | Admit: 2018-07-28 | Discharge: 2018-07-28 | Disposition: A | Discharge status: Home / Self Care | Payer: BLUE CROSS/BLUE SHIELD | Source: Ambulatory Visit | Attending: Urology | Admitting: Urology

## 2018-07-28 ENCOUNTER — Other Ambulatory Visit: Payer: Self-pay

## 2018-07-28 NOTE — Patient Instructions (Signed)
Your procedure is scheduled on: Friday 08/11/18 Report to [facility]. To find out your arrival time please call [phone] between 1PM - 3PM on Thursday 08/10/18.  Remember: Instructions that are not followed completely may result in serious medical risk, up to and including death, or upon the discretion of your surgeon and anesthesiologist your surgery may need to be rescheduled.     _X__ 1. Do not eat food after midnight the night before your procedure.                 No gum chewing or hard candies. You may drink clear liquids up to 2 hours                 before you are scheduled to arrive for your surgery- DO not drink clear                 liquids within 2 hours of the start of your surgery.                 Clear Liquids include:  water, apple juice without pulp, clear carbohydrate                 drink such as Clearfast or Gatorade, Black Coffee or Tea (Do not add                 anything to coffee or tea).  __X__2.  On the morning of surgery brush your teeth with toothpaste and water, you                 may rinse your mouth with mouthwash if you wish.  Do not swallow any              toothpaste of mouthwash.     _X__ 3.  No Alcohol for 24 hours before or after surgery.   _X__ 4.  Do Not Smoke or use e-cigarettes For 24 Hours Prior to Your Surgery.                 Do not use any chewable tobacco products for at least 6 hours prior to                 surgery.  ____  5.  Bring all medications with you on the day of surgery if instructed.   __X__  6.  Notify your doctor if there is any change in your medical condition      (cold, fever, infections).     Do not wear jewelry, make-up, hairpins, clips or nail polish. Do not wear lotions, powders, or perfumes.  Do not shave 48 hours prior to surgery. Men may shave face and neck. Do not bring valuables to the hospital.    [facility] is not responsible for any belongings or  valuables.  Contacts, dentures/partials or body piercings may not be worn into surgery. Bring a case for your contacts, glasses or hearing aids, a denture cup will be supplied. Leave your suitcase in the car. After surgery it may be brought to your room. For patients admitted to the hospital, discharge time is determined by your treatment team.   Patients discharged the day of surgery will not be allowed to drive home.   Please read over the following fact sheets that you were given:   MRSA Information  __X__ Take these medicines the morning of surgery with A SIP OF WATER:  1.   2.   3.   4.  5.  6.  ____ Fleet Enema (as directed)   __ __ Use CHG Soap/SAGE wipes as directed  ____ Use inhalers on the day of surgery  ____ Stop metformin/Janumet/Farxiga 2 days prior to surgery    ____ Take 1/2 of usual insulin dose the night before surgery. No insulin the morning          of surgery.   ____ Stop Blood Thinners Coumadin/Plavix/Xarelto/Pleta/Pradaxa/Eliquis/Effient/Aspirin  on   Or contact your Surgeon, Cardiologist or Medical Doctor regarding  ability to stop your blood thinners  __X__ Stop Anti-inflammatories 7 days before surgery such as Advil, Ibuprofen, Motrin,  BC or Goodies Powder, Naprosyn, Naproxen, Aleve, Aspirin    __X__ Stopall herbal supplements, fish oil or vitamin E until after surgery.    ____ Bring C-Pap to the hospital.     [facility] ON Tuesday 07/08/18

## 2018-07-31 ENCOUNTER — Encounter
Admission: RE | Admit: 2018-07-31 | Discharge: 2018-07-31 | Disposition: A | Discharge status: Home / Self Care | Payer: BLUE CROSS/BLUE SHIELD | Source: Ambulatory Visit | Attending: Urology | Admitting: Urology

## 2018-07-31 ENCOUNTER — Other Ambulatory Visit: Payer: Self-pay

## 2018-07-31 DIAGNOSIS — Z01812 Encounter for preprocedural laboratory examination: Secondary | ICD-10-CM | POA: Diagnosis not present

## 2018-07-31 LAB — URINALYSIS, ROUTINE W REFLEX MICROSCOPIC
Bacteria, UA: NONE SEEN
Bilirubin Urine: NEGATIVE
Glucose, UA: NEGATIVE mg/dL
Ketones, ur: NEGATIVE mg/dL
Nitrite: NEGATIVE
Protein, ur: 30 mg/dL — AB
RBC / HPF: >50 RBC/hpf — ABNORMAL HIGH (ref 0–5)
Specific Gravity, Urine: 1.016 (ref 1.005–1.030)
pH: 5 (ref 5.0–8.0)

## 2018-07-31 NOTE — Pre-Procedure Instructions (Signed)
UA results sent to Dr. Diamantina Providence for review.

## 2018-08-01 LAB — URINE CULTURE: Culture: NO GROWTH

## 2018-08-02 ENCOUNTER — Other Ambulatory Visit: Payer: Self-pay | Admitting: Radiology

## 2018-08-03 ENCOUNTER — Other Ambulatory Visit: Payer: Self-pay | Admitting: Radiology

## 2018-08-08 ENCOUNTER — Other Ambulatory Visit: Payer: Self-pay

## 2018-08-08 ENCOUNTER — Other Ambulatory Visit
Admission: RE | Admit: 2018-08-08 | Discharge: 2018-08-08 | Disposition: A | Discharge status: Home / Self Care | Payer: BLUE CROSS/BLUE SHIELD | Source: Ambulatory Visit | Attending: Urology | Admitting: Urology

## 2018-08-08 DIAGNOSIS — Z1159 Encounter for screening for other viral diseases: Secondary | ICD-10-CM | POA: Diagnosis not present

## 2018-08-08 DIAGNOSIS — Z01812 Encounter for preprocedural laboratory examination: Secondary | ICD-10-CM | POA: Diagnosis not present

## 2018-08-09 LAB — NOVEL CORONAVIRUS, NAA (HOSP ORDER, SEND-OUT TO REF LAB; TAT 18-24 HRS): SARS-CoV-2, NAA: NOT DETECTED

## 2018-08-10 MED ORDER — CIPROFLOXACIN IN D5W 400 MG/200ML IV SOLN
400.0000 mg | Freq: Once | INTRAVENOUS | Status: AC
  Administered 2018-08-11: 400 mg via INTRAVENOUS

## 2018-08-11 ENCOUNTER — Ambulatory Visit
Admission: RE | Admit: 2018-08-11 | Discharge: 2018-08-11 | Disposition: A | Discharge status: Home / Self Care | Payer: BLUE CROSS/BLUE SHIELD | Attending: Urology | Admitting: Urology

## 2018-08-11 ENCOUNTER — Other Ambulatory Visit: Payer: Self-pay

## 2018-08-11 ENCOUNTER — Encounter
Admission: RE | Disposition: A | Discharge status: Home / Self Care | Payer: Self-pay | Source: Home / Self Care | Attending: Urology

## 2018-08-11 ENCOUNTER — Encounter: Payer: Self-pay | Admitting: Emergency Medicine

## 2018-08-11 ENCOUNTER — Ambulatory Visit: Payer: BLUE CROSS/BLUE SHIELD | Admitting: Anesthesiology

## 2018-08-11 ENCOUNTER — Ambulatory Visit: Payer: BLUE CROSS/BLUE SHIELD

## 2018-08-11 DIAGNOSIS — Z87442 Personal history of urinary calculi: Secondary | ICD-10-CM | POA: Diagnosis not present

## 2018-08-11 DIAGNOSIS — Z880 Allergy status to penicillin: Secondary | ICD-10-CM | POA: Diagnosis not present

## 2018-08-11 DIAGNOSIS — Z8249 Family history of ischemic heart disease and other diseases of the circulatory system: Secondary | ICD-10-CM | POA: Insufficient documentation

## 2018-08-11 DIAGNOSIS — R310 Gross hematuria: Secondary | ICD-10-CM | POA: Insufficient documentation

## 2018-08-11 DIAGNOSIS — F17210 Nicotine dependence, cigarettes, uncomplicated: Secondary | ICD-10-CM | POA: Diagnosis not present

## 2018-08-11 DIAGNOSIS — Z820 Family history of epilepsy and other diseases of the nervous system: Secondary | ICD-10-CM | POA: Insufficient documentation

## 2018-08-11 DIAGNOSIS — Z885 Allergy status to narcotic agent status: Secondary | ICD-10-CM | POA: Insufficient documentation

## 2018-08-11 DIAGNOSIS — N200 Calculus of kidney: Secondary | ICD-10-CM | POA: Diagnosis not present

## 2018-08-11 DIAGNOSIS — Z8049 Family history of malignant neoplasm of other genital organs: Secondary | ICD-10-CM | POA: Diagnosis not present

## 2018-08-11 DIAGNOSIS — Z87440 Personal history of urinary (tract) infections: Secondary | ICD-10-CM | POA: Insufficient documentation

## 2018-08-11 DIAGNOSIS — Z833 Family history of diabetes mellitus: Secondary | ICD-10-CM | POA: Insufficient documentation

## 2018-08-11 DIAGNOSIS — Z90722 Acquired absence of ovaries, bilateral: Secondary | ICD-10-CM | POA: Insufficient documentation

## 2018-08-11 DIAGNOSIS — R8289 Other abnormal findings on cytological and histological examination of urine: Secondary | ICD-10-CM | POA: Diagnosis not present

## 2018-08-11 HISTORY — PX: CYSTOSCOPY/URETEROSCOPY/HOLMIUM LASER/STENT PLACEMENT: SHX6546

## 2018-08-11 SURGERY — CYSTOSCOPY/URETEROSCOPY/HOLMIUM LASER/STENT PLACEMENT
Anesthesia: General | Laterality: Right

## 2018-08-11 MED ORDER — IOPAMIDOL (ISOVUE-M 200) INJECTION 41%
INTRAMUSCULAR | Status: DC | PRN
  Administered 2018-08-11: 8 mL

## 2018-08-11 MED ORDER — DEXAMETHASONE SODIUM PHOSPHATE 10 MG/ML IJ SOLN
INTRAMUSCULAR | Status: DC | PRN
  Administered 2018-08-11: 5 mg via INTRAVENOUS

## 2018-08-11 MED ORDER — FENTANYL CITRATE (PF) 100 MCG/2ML IJ SOLN
INTRAMUSCULAR | Status: AC
  Administered 2018-08-11: 25 ug via INTRAVENOUS
  Filled 2018-08-11: qty 2

## 2018-08-11 MED ORDER — FENTANYL CITRATE (PF) 100 MCG/2ML IJ SOLN
INTRAMUSCULAR | Status: AC
  Filled 2018-08-11: qty 2

## 2018-08-11 MED ORDER — BELLADONNA ALKALOIDS-OPIUM 16.2-60 MG RE SUPP
RECTAL | Status: AC
  Filled 2018-08-11: qty 1

## 2018-08-11 MED ORDER — FAMOTIDINE 20 MG PO TABS
20.0000 mg | ORAL_TABLET | Freq: Once | ORAL | Status: AC
  Administered 2018-08-11: 20 mg via ORAL

## 2018-08-11 MED ORDER — LACTATED RINGERS IV SOLN
INTRAVENOUS | Status: DC
  Administered 2018-08-11: 14:00:00 via INTRAVENOUS

## 2018-08-11 MED ORDER — BELLADONNA ALKALOIDS-OPIUM 16.2-60 MG RE SUPP
RECTAL | Status: DC | PRN
  Administered 2018-08-11: 1 via RECTAL

## 2018-08-11 MED ORDER — ONDANSETRON HCL 4 MG/2ML IJ SOLN
INTRAMUSCULAR | Status: DC | PRN
  Administered 2018-08-11: 4 mg via INTRAVENOUS

## 2018-08-11 MED ORDER — CIPROFLOXACIN IN D5W 400 MG/200ML IV SOLN
INTRAVENOUS | Status: AC
  Filled 2018-08-11: qty 200

## 2018-08-11 MED ORDER — EPHEDRINE SULFATE 50 MG/ML IJ SOLN
INTRAMUSCULAR | Status: DC | PRN
  Administered 2018-08-11: 5 mg via INTRAVENOUS

## 2018-08-11 MED ORDER — FENTANYL CITRATE (PF) 100 MCG/2ML IJ SOLN
INTRAMUSCULAR | Status: DC | PRN
  Administered 2018-08-11: 100 ug via INTRAVENOUS

## 2018-08-11 MED ORDER — FENTANYL CITRATE (PF) 100 MCG/2ML IJ SOLN
25.0000 ug | INTRAMUSCULAR | Status: DC | PRN
  Administered 2018-08-11 (×4): 25 ug via INTRAVENOUS

## 2018-08-11 MED ORDER — TAMSULOSIN HCL 0.4 MG PO CAPS: 0.4000 mg | ORAL_CAPSULE | Freq: Every day | ORAL | 0 refills | Status: DC

## 2018-08-11 MED ORDER — PROMETHAZINE HCL 25 MG/ML IJ SOLN: 6.2500 mg | INTRAMUSCULAR | Status: DC | PRN

## 2018-08-11 MED ORDER — LIDOCAINE HCL (CARDIAC) PF 100 MG/5ML IV SOSY
PREFILLED_SYRINGE | INTRAVENOUS | Status: DC | PRN
  Administered 2018-08-11: 80 mg via INTRAVENOUS

## 2018-08-11 MED ORDER — OXYBUTYNIN CHLORIDE ER 10 MG PO TB24: 10.0000 mg | ORAL_TABLET | Freq: Every day | ORAL | 0 refills | Status: AC | PRN

## 2018-08-11 MED ORDER — SUGAMMADEX SODIUM 200 MG/2ML IV SOLN
INTRAVENOUS | Status: DC | PRN
  Administered 2018-08-11: 150 mg via INTRAVENOUS

## 2018-08-11 MED ORDER — FAMOTIDINE 20 MG PO TABS
ORAL_TABLET | ORAL | Status: AC
  Filled 2018-08-11: qty 1

## 2018-08-11 MED ORDER — PROPOFOL 10 MG/ML IV BOLUS
INTRAVENOUS | Status: DC | PRN
  Administered 2018-08-11: 160 mg via INTRAVENOUS

## 2018-08-11 MED ORDER — ROCURONIUM BROMIDE 100 MG/10ML IV SOLN
INTRAVENOUS | Status: DC | PRN
  Administered 2018-08-11: 35 mg via INTRAVENOUS

## 2018-08-11 MED ORDER — CIPROFLOXACIN HCL 500 MG PO TABS: 500.0000 mg | ORAL_TABLET | Freq: Every day | ORAL | 0 refills | Status: AC

## 2018-08-11 MED ORDER — KETOROLAC TROMETHAMINE 30 MG/ML IJ SOLN
INTRAMUSCULAR | Status: DC | PRN
  Administered 2018-08-11: 15 mg via INTRAVENOUS

## 2018-08-11 MED ORDER — HYDROCODONE-ACETAMINOPHEN 5-325 MG PO TABS: 1.0000 | ORAL_TABLET | ORAL | 0 refills | Status: AC | PRN

## 2018-08-11 SURGICAL SUPPLY — 40 items
BAG DRAIN CYSTO-URO LG1000N (MISCELLANEOUS) ×3 IMPLANT
BRUSH SCRUB EZ  4% CHG (MISCELLANEOUS) ×2
BRUSH SCRUB EZ 1% IODOPHOR (MISCELLANEOUS) ×3 IMPLANT
BRUSH SCRUB EZ 4% CHG (MISCELLANEOUS) ×1 IMPLANT
BULB IRRIG PATHFIND (MISCELLANEOUS) IMPLANT
CATH URETL 5X70 OPEN END (CATHETERS) IMPLANT
CNTNR SPEC 2.5X3XGRAD LEK (MISCELLANEOUS)
CONT SPEC 4OZ STER OR WHT (MISCELLANEOUS)
CONT SPEC 4OZ STRL OR WHT (MISCELLANEOUS)
CONTAINER SPEC 2.5X3XGRAD LEK (MISCELLANEOUS) IMPLANT
DRAPE UTILITY 15X26 TOWEL STRL (DRAPES) ×3 IMPLANT
DRSG TELFA 4X3 1S NADH ST (GAUZE/BANDAGES/DRESSINGS) ×3 IMPLANT
ELECT REM PT RETURN 9FT ADLT (ELECTROSURGICAL) ×3
ELECTRODE REM PT RTRN 9FT ADLT (ELECTROSURGICAL) ×1 IMPLANT
FIBER LASER LITHO 273 (Laser) ×3 IMPLANT
GLOVE BIOGEL PI IND STRL 7.5 (GLOVE) ×1 IMPLANT
GLOVE BIOGEL PI INDICATOR 7.5 (GLOVE) ×2
GOWN STRL REUS W/ TWL LRG LVL3 (GOWN DISPOSABLE) ×1 IMPLANT
GOWN STRL REUS W/ TWL XL LVL3 (GOWN DISPOSABLE) ×1 IMPLANT
GOWN STRL REUS W/TWL LRG LVL3 (GOWN DISPOSABLE) ×3
GOWN STRL REUS W/TWL XL LVL3 (GOWN DISPOSABLE) ×3
GUIDEWIRE GREEN .038 145CM (MISCELLANEOUS) ×3 IMPLANT
GUIDEWIRE STR DUAL SENSOR (WIRE) ×3 IMPLANT
INFUSOR MANOMETER BAG 3000ML (MISCELLANEOUS) ×3 IMPLANT
INTRODUCER DILATOR DOUBLE (INTRODUCER) ×3 IMPLANT
KIT TURNOVER CYSTO (KITS) ×3 IMPLANT
PACK CYSTO AR (MISCELLANEOUS) ×3 IMPLANT
SET CYSTO W/LG BORE CLAMP LF (SET/KITS/TRAYS/PACK) ×3 IMPLANT
SHEATH URETERAL 12FRX35CM (MISCELLANEOUS) IMPLANT
SOL .9 NS 3000ML IRR  AL (IV SOLUTION) ×2
SOL .9 NS 3000ML IRR AL (IV SOLUTION) ×1
SOL .9 NS 3000ML IRR UROMATIC (IV SOLUTION) ×1 IMPLANT
STENT URET 6FRX24 CONTOUR (STENTS) IMPLANT
STENT URET 6FRX26 CONTOUR (STENTS) ×3 IMPLANT
SURGILUBE 2OZ TUBE FLIPTOP (MISCELLANEOUS) ×3 IMPLANT
SYR 10ML LL (SYRINGE) ×3 IMPLANT
TUBING ART PRESS 48 MALE/FEM (TUBING) IMPLANT
VALVE UROSEAL ADJ ENDO (VALVE) ×3 IMPLANT
WATER STERILE IRR 1000ML POUR (IV SOLUTION) ×3 IMPLANT
WATER STERILE IRR 3000ML UROMA (IV SOLUTION) ×3 IMPLANT

## 2018-08-11 NOTE — Anesthesia Postprocedure Evaluation (Signed)
Anesthesia Post Note  Patient: Sharon Wright  Procedure(s) Performed: CYSTOSCOPY/URETEROSCOPY/HOLMIUM LASER/STENT PLACEMENT (Right )  Patient location during evaluation: PACU Anesthesia Type: General Level of consciousness: awake and alert Pain management: pain level controlled Vital Signs Assessment: post-procedure vital signs reviewed and stable Respiratory status: spontaneous breathing, nonlabored ventilation, respiratory function stable and patient connected to nasal cannula oxygen Cardiovascular status: blood pressure returned to baseline and stable Postop Assessment: no apparent nausea or vomiting Anesthetic complications: no     Last Vitals:  Vitals:   08/11/18 1752 08/11/18 1755  BP: 111/71 114/68  Pulse: 60 (!) 51  Resp: 18 18  Temp: 36.5 C   SpO2: 99% 94%    Last Pain:  Vitals:   08/11/18 1752  TempSrc: Temporal  PainSc: 2                  [name]

## 2018-08-11 NOTE — Op Note (Signed)
Date of procedure: 08/11/18  Preoperative diagnosis:  1. Right renal stone, atypical cytology  Postoperative diagnosis:  1. Same  Procedure: 1. Cystoscopy, right ureteroscopy, laser lithotripsy, right retrograde pyelogram with intraoperative interpretation, right ureteral stent placement  Surgeon: Brian Sninsky, MD  Anesthesia: General  Complications: None  Intraoperative findings:  1.  Normal cystoscopy, no suspicious lesions.  Ureteral orifices orthotopic bilaterally 2.  Uncomplicated dusting of right 1 cm renal stone 3.  Uncomplicated right ureteral stent placement  EBL: Minimal  Specimens: None  Drains: Right 6 French by 26 cm ureteral stent  Indication: Sharon Wright is a 58 y.o. patient with recurrent UTIs and a 1 cm right renal stone, as well as atypical cytology and gross hematuria.  After reviewing the management options for treatment, they elected to proceed with the above surgical procedure(s). We have discussed the potential benefits and risks of the procedure, side effects of the proposed treatment, the likelihood of the patient achieving the goals of the procedure, and any potential problems that might occur during the procedure or recuperation. Informed consent has been obtained.  Description of procedure:  The patient was taken to the operating room and general anesthesia was induced. SCDs were placed for DVT prophylaxis. The patient was placed in the dorsal lithotomy position, prepped and draped in the usual sterile fashion, and preoperative antibiotics were administered. A preoperative time-out was performed.   A 21 French rigid cystoscope was used to intubate the urethra.  Thorough cystoscopy was performed which demonstrated no abnormal findings.  The ureteral orifices were orthotopic bilaterally.  A sensor wire was advanced into the right ureteral orifice up into the kidney under fluoroscopic vision.  The stone was clearly seen on x-ray.  A dual-lumen access  catheter was used to add a second safety wire.  I attempted to pass the single-channel flexible ureteroscope over the wire, however met resistance in the mid ureter.  Under direct vision, there was some slight narrowing of the ureter, but no suspicious lesions or tumors.  A Super Stiff wire was passed through the scope, and the safety wire removed.  The flexible ureteroscope then advanced easily up into the kidney under fluoroscopic vision.  Thorough pyeloscopy revealed no suspicious mucosal findings.  There was a 1 cm yellow crystalline stone in the renal pelvis.  This was fragmented to dust using a 273 m laser fiber on settings of 0.2 J and 40 Hz.  Thorough pyeloscopy revealed no residual fragments.  Contrast was injected through the scope and retrograde pyelogram demonstrated no extravasation or filling defects.  A sensor wire was replaced through the scope, and pullback ureteroscopy demonstrated no worrisome findings.  A 6 French by 26 cm ureteral stent was placed over the wire under fluoroscopic vision with an excellent curl in the renal pelvis.  Cystoscopy confirmed an excellent curl in the bladder, and the bladder was drained.  A belladonna suppository was placed.  Disposition: Stable to PACU  Plan: Follow-up for stent removal in 1 week Cipro prophylaxis while stent in place Renal ultrasound in 4 to 6 weeks to rule out silent hydronephrosis  Brian Sninsky, MD   

## 2018-08-11 NOTE — Anesthesia Procedure Notes (Signed)
Procedure Name: Intubation Date/Time: 08/11/2018 3:59 PM Performed by: Chanetta Marshall, CRNA Pre-anesthesia Checklist: Patient identified, Emergency Drugs available, Suction available and Patient being monitored Patient Re-evaluated:Patient Re-evaluated prior to induction Oxygen Delivery Method: Circle system utilized Preoxygenation: Pre-oxygenation with 100% oxygen Induction Type: IV induction Ventilation: Mask ventilation without difficulty Laryngoscope Size: Mac and 3 Tube type: Oral Tube size: 7.0 mm Number of attempts: 1 Airway Equipment and Method: Stylet and Oral airway Placement Confirmation: ETT inserted through vocal cords under direct vision,  positive ETCO2,  breath sounds checked- equal and bilateral and CO2 detector Secured at: 21 cm Tube secured with: Tape Dental Injury: Teeth and Oropharynx as per pre-operative assessment

## 2018-08-11 NOTE — Anesthesia Post-op Follow-up Note (Signed)
Anesthesia QCDR form completed.        

## 2018-08-11 NOTE — Discharge Instructions (Signed)

## 2018-08-11 NOTE — Transfer of Care (Signed)
Immediate Anesthesia Transfer of Care Note  Patient: Sharon Wright  Procedure(s) Performed: CYSTOSCOPY/URETEROSCOPY/HOLMIUM LASER/STENT PLACEMENT (Right )  Patient Location: PACU  Anesthesia Type:General  Level of Consciousness: awake, alert  and oriented  Airway & Oxygen Therapy: Patient Spontanous Breathing and Patient connected to face mask oxygen  Post-op Assessment: Report given to RN and Post -op Vital signs reviewed and stable  Post vital signs: Reviewed and stable  Last Vitals:  Vitals Value Taken Time  BP    Temp    Pulse    Resp    SpO2      Last Pain:  Vitals:   08/11/18 1325  TempSrc: Temporal  PainSc: 4          Complications: No apparent anesthesia complications

## 2018-08-11 NOTE — H&P (Signed)
3:24 PM   Sharon Wright Apr 18, 1960 [id number]  CC: Atypical cytology, right renal stone  HPI: She initially presented with gross hematuria and CT urogram 05/17/2018 showed no filling defects, renal masses, or hydronephrosis, however there was an 8 mm non-obstructing right lower pole stone.  Cystoscopy was limited secondary to gross hematuria, however showed no obvious bladder tumors.  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  She has also had difficulty with recurrent urinary tract infections.  She reports her gross hematuria has resolved.  Prior to her cytology returning as atypical, we had planned for SWL in June for her non-obstructing right-sided stone.   Of note, she also has significant risk factors for bladder cancer including active smoking with a 40-pack-year history, as well as a history of employment in the [employer].  We had a long conversation about options moving forward.  I recommended further investigation of her atypical cytology with a cystoscopy under anesthesia with possible bladder biopsies, consideration of bilateral retrograde pyelograms and possible diagnostic ureteroscopy, as well as simultaneous treatment of her right-sided 8 mm nonobstructing stone with ureteroscopy/laser lithotripsy/stent placement.      PMH: Past Medical History:  Diagnosis Date  . History of kidney stones    still have 5 in right kidney  . Microscopic hematuria    hx of due to kidney stones  . Pelvic mass in female   . Tobacco abuse 03/22/2017    Surgical History: Past Surgical History:  Procedure Laterality Date  . back syugery  1993   lower lumbar  . Benign tumor  1993  . BUNIONECTOMY Left 2006  . LITHOTRIPSY    . ROBOTIC ASSISTED BILATERAL SALPINGO OOPHERECTOMY Bilateral 02/06/2018   Procedure: XI ROBOTIC ASSISTED BILATERAL SALPINGO OOPHORECTOMY , PELVIC WASHINGS;  Surgeon: Isabel Caprice, MD;  Location: [facility];  Service: Gynecology;  Laterality: Bilateral;  . SKIN SURGERY  2008   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    Allergies:  Allergies  Allergen Reactions  . Oxycodone Nausea And Vomiting  . Penicillins Rash and Other (See Comments)    Childhood allergy rash after 3 days. No SOB, no hospitilization Has patient had a PCN reaction causing immediate rash, facial/tongue/throat swelling, SOB or lightheadedness with hypotension: No Has patient had a PCN reaction causing severe rash involving mucus membranes or skin necrosis: No Has patient had a PCN reaction that required hospitalization: No Has patient had a PCN reaction occurring within the last 10 years: No If all of the above answers are "NO", then may proceed with Cephalos    Family History: Family History  Problem Relation Age of Onset  . Congestive Heart Failure Mother   . Heart disease Mother   . Uterine cancer Mother   . Diabetes Father   . Heart disease Father   . Dementia Father   . Heart disease Sister   . Diabetes Sister     Social History:  reports that she has been smoking cigarettes. She has a 40.00 pack-year smoking history. She has never used smokeless tobacco. She reports that she does not drink alcohol or use drugs.  ROS: Negative aside from HPI  Physical Exam: BP 110/60   Pulse 60   Temp 98.9 F (37.2 C) (Temporal)   Resp 17   Ht 5\' 9"  (1.753 m)   Wt 79.4 kg   SpO2 99%   BMI 25.84 kg/m    Constitutional:  Alert and oriented, No acute  distress. Cardiovascular: RRR Respiratory: Clear to auscultation bilaterally GI: Abdomen is soft, nontender, nondistended, no abdominal masses Lymph: No cervical or inguinal lymphadenopathy. Skin: No rashes, bruises or suspicious lesions. Neurologic: Grossly intact, no focal deficits, moving all 4 extremities. Psychiatric: Normal mood and affect.  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  Plan for cystoscopy, possible bladder biopsy, right URS/LL/Stent today. We discussed the risks at length including bleeding, infection, pain, stent related symptoms, possible need for additional procedures.  Billey Co, [facility] [address] [place], [place] [address] [phone]

## 2018-08-11 NOTE — Anesthesia Preprocedure Evaluation (Signed)
Anesthesia Evaluation  Patient identified by MRN, date of birth, ID band Patient awake    Reviewed: Allergy & Precautions, H&P , NPO status , Patient's Chart, lab work & pertinent test results, reviewed documented beta blocker date and time   History of Anesthesia Complications Negative for: history of anesthetic complications  Airway Mallampati: II  TM Distance: >3 FB Neck ROM: full  Mouth opening: Limited Mouth Opening  Dental  (+) Dental Advidsory Given, Missing, Partial Lower, Teeth Intact   Pulmonary neg shortness of breath, neg COPD, neg recent URI, Current Smoker,           Cardiovascular Exercise Tolerance: Good negative cardio ROS       Neuro/Psych negative neurological ROS  negative psych ROS   GI/Hepatic negative GI ROS, Neg liver ROS,   Endo/Other  negative endocrine ROS  Renal/GU Renal disease (kidney stones)  negative genitourinary   Musculoskeletal   Abdominal   Peds  Hematology negative hematology ROS (+)   Anesthesia Other Findings Past Medical History: No date: History of kidney stones     Comment:  still have 5 in right kidney No date: Microscopic hematuria     Comment:  hx of due to kidney stones No date: Pelvic mass in female 03/22/2017: Tobacco abuse   Reproductive/Obstetrics negative OB ROS                             Anesthesia Physical Anesthesia Plan  ASA: II  Anesthesia Plan: General   Post-op Pain Management:    Induction: Intravenous  PONV Risk Score and Plan: 2 and Ondansetron, Dexamethasone, Midazolam, Promethazine and Treatment may vary due to age or medical condition  Airway Management Planned: Oral ETT  Additional Equipment:   Intra-op Plan:   Post-operative Plan:   Informed Consent: I have reviewed the patients History and Physical, chart, labs and discussed the procedure including the risks, benefits and alternatives for the proposed  anesthesia with the patient or authorized representative who has indicated his/her understanding and acceptance.     Dental Advisory Given  Plan Discussed with: Anesthesiologist, CRNA and Surgeon  Anesthesia Plan Comments:        Anesthesia Quick Evaluation

## 2018-08-12 ENCOUNTER — Encounter: Payer: Self-pay | Admitting: Urology

## 2018-08-15 ENCOUNTER — Ambulatory Visit (INDEPENDENT_AMBULATORY_CARE_PROVIDER_SITE_OTHER): Payer: BC Managed Care – PPO | Admitting: Urology

## 2018-08-15 ENCOUNTER — Encounter: Payer: Self-pay | Admitting: Urology

## 2018-08-15 ENCOUNTER — Other Ambulatory Visit: Payer: Self-pay

## 2018-08-15 VITALS — BP 122/82 | HR 80 | Ht 69.0 in | Wt 180.0 lb

## 2018-08-15 DIAGNOSIS — N200 Calculus of kidney: Secondary | ICD-10-CM | POA: Diagnosis not present

## 2018-08-15 MED ORDER — KETOROLAC TROMETHAMINE 60 MG/2ML IM SOLN
15.0000 mg | Freq: Once | INTRAMUSCULAR | Status: AC
  Administered 2018-08-15: 15 mg via INTRAMUSCULAR

## 2018-08-15 NOTE — Patient Instructions (Signed)
Dietary Guidelines to Help Prevent Kidney Stones Kidney stones are deposits of minerals and salts that form inside your kidneys. Your risk of developing kidney stones may be greater depending on your diet, your lifestyle, the medicines you take, and whether you have certain medical conditions. Most people can reduce their chances of developing kidney stones by following the instructions below. Depending on your overall health and the type of kidney stones you tend to develop, your dietitian may give you more specific instructions. What are tips for following this plan? Reading food labels  Choose foods with "no salt added" or "low-salt" labels. Limit your sodium intake to less than 1500 mg per day.  Choose foods with calcium for each meal and snack. Try to eat about 300 mg of calcium at each meal. Foods that contain 200-500 mg of calcium per serving include: ? 8 oz (237 ml) of milk, fortified nondairy milk, and fortified fruit juice. ? 8 oz (237 ml) of kefir, yogurt, and soy yogurt. ? 4 oz (118 ml) of tofu. ? 1 oz of cheese. ? 1 cup (300 g) of dried figs. ? 1 cup (91 g) of cooked broccoli. ? 1-3 oz can of sardines or mackerel.  Most people need 1000 to 1500 mg of calcium each day. Talk to your dietitian about how much calcium is recommended for you. Shopping  Buy plenty of fresh fruits and vegetables. Most people do not need to avoid fruits and vegetables, even if they contain nutrients that may contribute to kidney stones.  When shopping for convenience foods, choose: ? Whole pieces of fruit. ? Premade salads with dressing on the side. ? Low-fat fruit and yogurt smoothies.  Avoid buying frozen meals or prepared deli foods.  Look for foods with live cultures, such as yogurt and kefir. Cooking  Do not add salt to food when cooking. Place a salt shaker on the table and allow each person to add his or her own salt to taste.  Use vegetable protein, such as beans, textured vegetable  protein (TVP), or tofu instead of meat in pasta, casseroles, and soups. Meal planning   Eat less salt, if told by your dietitian. To do this: ? Avoid eating processed or premade food. ? Avoid eating fast food.  Eat less animal protein, including cheese, meat, poultry, or fish, if told by your dietitian. To do this: ? Limit the number of times you have meat, poultry, fish, or cheese each week. Eat a diet free of meat at least 2 days a week. ? Eat only one serving each day of meat, poultry, fish, or seafood. ? When you prepare animal protein, cut pieces into small portion sizes. For most meat and fish, one serving is about the size of one deck of cards.  Eat at least 5 servings of fresh fruits and vegetables each day. To do this: ? Keep fruits and vegetables on hand for snacks. ? Eat 1 piece of fruit or a handful of berries with breakfast. ? Have a salad and fruit at lunch. ? Have two kinds of vegetables at dinner.  Limit foods that are high in a substance called oxalate. These include: ? Spinach. ? Rhubarb. ? Beets. ? Potato chips and french fries. ? Nuts.  If you regularly take a diuretic medicine, make sure to eat at least 1-2 fruits or vegetables high in potassium each day. These include: ? Avocado. ? Banana. ? Orange, prune, carrot, or tomato juice. ? Baked potato. ? Cabbage. ? Beans and split   If you regularly take a diuretic medicine, make sure to eat at least 1-2 fruits or vegetables high in potassium each day. These include:  ? Avocado.  ? Banana.  ? Orange, prune, carrot, or tomato juice.  ? Baked potato.  ? Cabbage.  ? Beans and split peas.  General instructions     Drink enough fluid to keep your urine clear or pale yellow. This is the most important thing you can do.   Talk to your health care provider and dietitian about taking daily supplements. Depending on your health and the cause of your kidney stones, you may be advised:  ? Not to take supplements with vitamin C.  ? To take a calcium supplement.  ? To take a daily probiotic supplement.  ? To take other supplements such as magnesium, fish oil, or vitamin B6.   Take all medicines and supplements as told by your health care provider.   Limit alcohol intake to no  more than 1 drink a day for nonpregnant women and 2 drinks a day for men. One drink equals 12 oz of beer, 5 oz of wine, or 1 oz of hard liquor.   Lose weight if told by your health care provider. Work with your dietitian to find strategies and an eating plan that works best for you.  What foods are not recommended?  Limit your intake of the following foods, or as told by your dietitian. Talk to your dietitian about specific foods you should avoid based on the type of kidney stones and your overall health.  Grains  Breads. Bagels. Rolls. Baked goods. Salted crackers. Cereal. Pasta.  Vegetables  Spinach. Rhubarb. Beets. Canned vegetables. Pickles. Olives.  Meats and other protein foods  Nuts. Nut butters. Large portions of meat, poultry, or fish. Salted or cured meats. Deli meats. Hot dogs. Sausages.  Dairy  Cheese.  Beverages  Regular soft drinks. Regular vegetable juice.  Seasonings and other foods  Seasoning blends with salt. Salad dressings. Canned soups. Soy sauce. Ketchup. Barbecue sauce. Canned pasta sauce. Casseroles. Pizza. Lasagna. Frozen meals. Potato chips. French fries.  Summary   You can reduce your risk of kidney stones by making changes to your diet.   The most important thing you can do is drink enough fluid. You should drink enough fluid to keep your urine clear or pale yellow.   Ask your health care provider or dietitian how much protein from animal sources you should eat each day, and also how much salt and calcium you should have each day.  This information is not intended to replace advice given to you by your health care provider. Make sure you discuss any questions you have with your health care provider.  Document Released: 06/12/2010 Document Revised: 01/27/2016 Document Reviewed: 01/27/2016  Elsevier Interactive Patient Education  2019 Elsevier Inc[employer]

## 2018-08-15 NOTE — Progress Notes (Signed)
IM Injection  Patient is present today for an IM Injection for treatment of stent removal pain Drug: Toradol  Dose:15mg  (0.57ml) Location:right upper outter buttocks Lot: [id number] Exp:09/21 Patient tolerated well, no complications were noted  Preformed by: Fonnie Jarvis, CMA  Additional notes/ Follow up: patient was kept in office for 15 min to ensure there was no reaction

## 2018-08-15 NOTE — Progress Notes (Signed)
Cystoscopy Procedure Note:  Indication: Stent removal s/p 6/12 R URS/LL/stent for 1cm renal stone  After informed consent and discussion of the procedure and its risks, Sharon Wright was positioned and prepped in the standard fashion. Cystoscopy was performed with a flexible cystoscope. The stent was grasped with flexible graspers and removed in its entirety. The patient tolerated the procedure well.  Findings: Uncomplicated stent removal  Assessment and Plan: Follow up in 4 weeks with renal ultrasound to evaluate for silent hydronephrosis  Billey Co, MD 08/15/2018

## 2018-08-15 NOTE — Addendum Note (Signed)
Addended by: Tommy Rainwater on: 08/15/2018 09:30 AM   Modules accepted: Orders

## 2018-08-16 LAB — URINALYSIS, COMPLETE
Bilirubin, UA: NEGATIVE
Glucose, UA: NEGATIVE
Nitrite, UA: NEGATIVE
Specific Gravity, UA: 1.025 (ref 1.005–1.030)
Urobilinogen, Ur: 0.2 mg/dL (ref 0.2–1.0)
pH, UA: 6 (ref 5.0–7.5)

## 2018-08-16 LAB — MICROSCOPIC EXAMINATION: RBC: >30 /hpf — AB (ref 0–2)

## 2018-08-22 ENCOUNTER — Ambulatory Visit: Payer: BC Managed Care – PPO | Admitting: Nurse Practitioner

## 2018-08-22 ENCOUNTER — Encounter: Payer: Self-pay | Admitting: Nurse Practitioner

## 2018-08-22 ENCOUNTER — Other Ambulatory Visit: Payer: Self-pay

## 2018-08-22 VITALS — BP 124/72 | HR 72 | Temp 98.1°F | Resp 14 | Ht 69.0 in | Wt 182.8 lb

## 2018-08-22 DIAGNOSIS — R001 Bradycardia, unspecified: Secondary | ICD-10-CM

## 2018-08-22 DIAGNOSIS — R1013 Epigastric pain: Secondary | ICD-10-CM

## 2018-08-22 DIAGNOSIS — Z716 Tobacco abuse counseling: Secondary | ICD-10-CM

## 2018-08-22 DIAGNOSIS — Z720 Tobacco use: Secondary | ICD-10-CM

## 2018-08-22 MED ORDER — BUPROPION HCL ER (SR) 150 MG PO TB12: ORAL_TABLET | ORAL | 0 refills | Status: DC

## 2018-08-22 MED ORDER — OMEPRAZOLE 20 MG PO CPDR: 20.0000 mg | DELAYED_RELEASE_CAPSULE | Freq: Every day | ORAL | 0 refills | Status: DC

## 2018-08-22 NOTE — Progress Notes (Signed)
Name: Sharon Wright   MRN: [id number]    DOB: 19-Nov-1960   Date:08/22/2018       Progress Note  Subjective  Chief Complaint  Chief Complaint  Patient presents with  . Abdominal Pain    LUQ radiates to mid back, onset 2 weeks. has recently had bladder stent removed    HPI  Patient endorses epigastric pain that shoots through to her left back pain is progressive throughout the day. Pain started last week. Last Tuesday woke up in the morning and realized pain was there mentioned it to urologist who said it would be expected. She had stent removed on that day and had significant relief on the right side. Pain is worst mid-day. She has taken ibuprofen without any relief. When pain is bad endorses nausea. Pain was worst on Friday and Saturday night- states it hurt so bad it was taking her breath away. States on Friday she was very tender. Endorses some chills on the weekend. Pain has improved some the last 2 days. States she takes about 12 ibuprofens a day for the past few months.   No change in chronic cough, denies chest pain, dizziness, lightheadedness, and diarrhea. Pain is not worse with food that she has noticed. PHQ2/9: Depression screen [facility] 2/9 08/22/2018 05/01/2018 04/26/2018 04/14/2018 12/12/2017  Decreased Interest 0 0 0 0 0  Down, Depressed, Hopeless 0 0 0 0 0  PHQ - 2 Score 0 0 0 0 0  Altered sleeping 0 0 0 0 -  Tired, decreased energy 0 0 0 0 -  Change in appetite 0 0 0 0 -  Feeling bad or failure about yourself  0 0 0 0 -  Trouble concentrating 0 0 0 0 -  Moving slowly or fidgety/restless 0 0 0 0 -  Suicidal thoughts 0 0 0 0 -  PHQ-9 Score 0 0 0 0 -  Difficult doing work/chores Not difficult at all Not difficult at all Not difficult at all Not difficult at all -    PHQ reviewed. Negative  Patient Active Problem List   Diagnosis Date Noted  . Gross hematuria 06/29/2018  . Pelvic mass in female 01/11/2018  . Obesity (BMI 30.0-34.9) 08/16/2017  . Tobacco abuse 03/22/2017     Past Medical History:  Diagnosis Date  . History of kidney stones    still have 5 in right kidney  . Microscopic hematuria    hx of due to kidney stones  . Pelvic mass in female   . Tobacco abuse 03/22/2017    Past Surgical History:  Procedure Laterality Date  . back syugery  1993   lower lumbar  . Benign tumor  1993  . BUNIONECTOMY Left 2006  . CYSTOSCOPY/URETEROSCOPY/HOLMIUM LASER/STENT PLACEMENT Right 08/11/2018   Procedure: CYSTOSCOPY/URETEROSCOPY/HOLMIUM LASER/STENT PLACEMENT;  Surgeon: Billey Co, MD;  Location: [facility];  Service: Urology;  Laterality: Right;  . LITHOTRIPSY    . ROBOTIC ASSISTED BILATERAL SALPINGO OOPHERECTOMY Bilateral 02/06/2018   Procedure: XI ROBOTIC ASSISTED BILATERAL SALPINGO OOPHORECTOMY , PELVIC WASHINGS;  Surgeon: Isabel Caprice, MD;  Location: [facility];  Service: Gynecology;  Laterality: Bilateral;  . 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 Smoking status: Current Every Day Smoker    Packs/day: 1.00    Years: 40.00    Pack years: 40.00    Types: Cigarettes  . Smokeless tobacco: Never Used  Substance Use Topics  .  Alcohol use: No     Current Outpatient Medications:  .  cholecalciferol (VITAMIN D) 1000 UNITS tablet, Take 1,000 Units by mouth daily. , Disp: , Rfl:  .  ibuprofen (ADVIL) 200 MG tablet, Take 600 mg by mouth every 8 (eight) hours as needed (pain)., Disp: , Rfl:  .  polyethylene glycol (MIRALAX / GLYCOLAX) packet, Take 17 g by mouth daily as needed for mild constipation. , Disp: , Rfl:  .  tamsulosin (FLOMAX) 0.4 MG CAPS capsule, Take 1 capsule (0.4 mg total) by mouth daily after supper. (Patient not taking: Reported on 08/22/2018), Disp: 14 capsule, Rfl: 0  Allergies  Allergen Reactions  . Oxycodone Nausea And Vomiting  . Penicillins Rash and Other (See Comments)    Childhood allergy rash after 3 days. No SOB, no hospitilization Has patient had a PCN  reaction causing immediate rash, facial/tongue/throat swelling, SOB or lightheadedness with hypotension: No Has patient had a PCN reaction causing severe rash involving mucus membranes or skin necrosis: No Has patient had a PCN reaction that required hospitalization: No Has patient had a PCN reaction occurring within the last 10 years: No If all of the above answers are "NO", then may proceed with Cephalos    ROS    No other specific complaints in a complete review of systems (except as listed in HPI above).  Objective  Vitals:   08/22/18 1013  BP: 124/72  Pulse: 72  Resp: 14  Temp: 98.1 F (36.7 C)  TempSrc: Oral  SpO2: 98%  Weight: 182 lb 12.8 oz (82.9 kg)  Height: 5\' 9"  (1.753 m)     Body mass index is 26.99 kg/m.  Nursing Note and Vital Signs reviewed.  Physical Exam Vitals signs reviewed.  Constitutional:      Appearance: She is well-developed.  HENT:     Head: Normocephalic and atraumatic.  Neck:     Musculoskeletal: Normal range of motion and neck supple.     Vascular: No carotid bruit.  Cardiovascular:     Rate and Rhythm: Bradycardia present.     Heart sounds: Normal heart sounds.  Pulmonary:     Effort: Pulmonary effort is normal.     Breath sounds: Normal breath sounds.  Abdominal:     General: Bowel sounds are normal.     Palpations: Abdomen is soft.     Tenderness: There is abdominal tenderness in the epigastric area.  Musculoskeletal: Normal range of motion.  Skin:    General: Skin is warm and dry.     Capillary Refill: Capillary refill takes less than 2 seconds.  Neurological:     Mental Status: She is alert and oriented to person, place, and time.     GCS: GCS eye subscore is 4. GCS verbal subscore is 5. GCS motor subscore is 6.     Sensory: No sensory deficit.  Psychiatric:        Speech: Speech normal.        Behavior: Behavior normal.        Thought Content: Thought content normal.        Judgment: Judgment normal.        No  results found for this or any previous visit (from the past 48 hour(s)).  Assessment & Plan  1. Epigastric abdominal pain Epigastric pain shoots to left back,constants with acute worsening after lunchtime. Excessive NSAID use- stop NSAIDS, start PPI. consider cardiac vs GI vs MSK- discussed red flag symptoms with patients  - CBC with Differential -  COMPLETE METABOLIC PANEL WITH GFR - EKG 12-Lead: sinus bradycardia, rate 52bpm.  - omeprazole (PRILOSEC) 20 MG capsule; Take 1 capsule (20 mg total) by mouth daily.  Dispense: 30 capsule; Refill: 0 - H. pylori breath test  2. Sinus bradycardia by electrocardiogram - Ambulatory referral to Cardiology  3. Encounter for smoking cessation counseling Spent greater than 3 minutes discussing cessation  - buPROPion (WELLBUTRIN SR) 150 MG 12 hr tablet; Take one tablet daily for three days then take 1 tablet twice a day there after (make sure second dose is before 4pm)  Dispense: 57 tablet; Refill: 0 4. Tobacco abuse - buPROPion (WELLBUTRIN SR) 150 MG 12 hr tablet; Take one tablet daily for three days then take 1 tablet twice a day there after (make sure second dose is before 4pm)  Dispense: 57 tablet; Refill: 0  -Red flags and when to present for emergency care or RTC including fever >101.15F, chest pain, shortness of breath, new/worsening/un-resolving symptoms, reviewed with patient at time of visit. Follow up and care instructions discussed and provided in AVS.  Follow-up in 2 weeks.  Face-to-face time with patient was more than 25 minutes, >50% time spent counseling and coordination of care

## 2018-08-22 NOTE — Patient Instructions (Addendum)
If you have not heard anything from my staff in a week about any orders/referrals/studies from today, please contact us here to follow-up [phone].  Please stop taking ibuprofen for at least one week. Use acetaminophen [phone] mg every 8 hours as needed ( DO NOT EXCEED 3,000mg  in 24 hours).   - We are starting wellbutrin for smoking cessation. Start by making a plan and picking a quit date. Start taking this medicine one week before your quit date and then toss your cigarettes and plan on spending your carton money on new shoes. Start by taking one tablet a day for three days, then go up to one tablet twice a day after that (make sure your last dose of the day is before 4pm).   - please follow-up sooner if your pain is unrelieved or if you have new or concerning symptoms.

## 2018-08-23 ENCOUNTER — Telehealth: Payer: Self-pay

## 2018-08-23 ENCOUNTER — Other Ambulatory Visit: Payer: Self-pay | Admitting: Nurse Practitioner

## 2018-08-23 DIAGNOSIS — B9681 Helicobacter pylori [H. pylori] as the cause of diseases classified elsewhere: Secondary | ICD-10-CM

## 2018-08-23 DIAGNOSIS — R1013 Epigastric pain: Secondary | ICD-10-CM

## 2018-08-23 LAB — CBC WITH DIFFERENTIAL/PLATELET
Absolute Monocytes: 567 {cells}/uL (ref 200–950)
Basophils Absolute: 124 {cells}/uL (ref 0–200)
Basophils Relative: 1.2 %
Eosinophils Absolute: 608 {cells}/uL — ABNORMAL HIGH (ref 15–500)
Eosinophils Relative: 5.9 %
HCT: 44.1 % (ref 35.0–45.0)
Hemoglobin: 14.9 g/dL (ref 11.7–15.5)
Lymphs Abs: 3193 {cells}/uL (ref 850–3900)
MCH: 30.4 pg (ref 27.0–33.0)
MCHC: 33.8 g/dL (ref 32.0–36.0)
MCV: 90 fL (ref 80.0–100.0)
MPV: 10.1 fL (ref 7.5–12.5)
Monocytes Relative: 5.5 %
Neutro Abs: 5809 {cells}/uL (ref 1500–7800)
Neutrophils Relative %: 56.4 %
Platelets: 276 Thousand/uL (ref 140–400)
RBC: 4.9 Million/uL (ref 3.80–5.10)
RDW: 13 % (ref 11.0–15.0)
Total Lymphocyte: 31 %
WBC: 10.3 Thousand/uL (ref 3.8–10.8)

## 2018-08-23 LAB — COMPLETE METABOLIC PANEL WITH GFR
AG Ratio: 1.7 (calc) (ref 1.0–2.5)
ALT: 13 U/L (ref 6–29)
AST: 15 U/L (ref 10–35)
Albumin: 3.8 g/dL (ref 3.6–5.1)
Alkaline phosphatase (APISO): 73 U/L (ref 37–153)
BUN: 20 mg/dL (ref 7–25)
CO2: 24 mmol/L (ref 20–32)
Calcium: 9.3 mg/dL (ref 8.6–10.4)
Chloride: 109 mmol/L (ref 98–110)
Creat: 0.67 mg/dL (ref 0.50–1.05)
GFR, Est African American: 113 mL/min/{1.73_m2} (ref >=60)
GFR, Est Non African American: 98 mL/min/{1.73_m2} (ref >=60)
Globulin: 2.2 g/dL (calc) (ref 1.9–3.7)
Glucose, Bld: 102 mg/dL — ABNORMAL HIGH (ref 65–99)
Potassium: 4.2 mmol/L (ref 3.5–5.3)
Sodium: 140 mmol/L (ref 135–146)
Total Bilirubin: 0.3 mg/dL (ref 0.2–1.2)
Total Protein: 6 g/dL — ABNORMAL LOW (ref 6.1–8.1)

## 2018-08-23 LAB — H. PYLORI BREATH TEST: H. pylori Breath Test: DETECTED — AB

## 2018-08-23 MED ORDER — TETRACYCLINE HCL 500 MG PO CAPS: 500.0000 mg | ORAL_CAPSULE | Freq: Four times a day (QID) | ORAL | 0 refills | Status: AC

## 2018-08-23 MED ORDER — OMEPRAZOLE 20 MG PO CPDR: 20.0000 mg | DELAYED_RELEASE_CAPSULE | Freq: Two times a day (BID) | ORAL | 0 refills | Status: DC

## 2018-08-23 MED ORDER — METRONIDAZOLE 250 MG PO TABS: 250.0000 mg | ORAL_TABLET | Freq: Four times a day (QID) | ORAL | 0 refills | Status: AC

## 2018-08-23 MED ORDER — BISMUTH SUBSALICYLATE 262 MG PO CHEW: 524.0000 mg | CHEWABLE_TABLET | Freq: Two times a day (BID) | ORAL | 0 refills | Status: DC

## 2018-08-23 NOTE — Telephone Encounter (Signed)
Copied from [place] [phone]. Topic: General - Other >> Aug 23, 2018 11:03 AM Rayann Heman wrote: Reason for CRM: pt would like [place] to know that she has an appointmnet with cardiology on 08/25/18. FYI

## 2018-08-25 DIAGNOSIS — N200 Calculus of kidney: Secondary | ICD-10-CM | POA: Diagnosis not present

## 2018-08-25 DIAGNOSIS — R0789 Other chest pain: Secondary | ICD-10-CM | POA: Diagnosis not present

## 2018-08-25 DIAGNOSIS — K219 Gastro-esophageal reflux disease without esophagitis: Secondary | ICD-10-CM | POA: Diagnosis not present

## 2018-08-25 DIAGNOSIS — F17290 Nicotine dependence, other tobacco product, uncomplicated: Secondary | ICD-10-CM | POA: Diagnosis not present

## 2018-09-07 ENCOUNTER — Encounter: Payer: BLUE CROSS/BLUE SHIELD | Admitting: Family Medicine

## 2018-09-15 ENCOUNTER — Encounter: Payer: BC Managed Care – PPO | Admitting: Nurse Practitioner

## 2018-09-18 ENCOUNTER — Telehealth: Payer: Self-pay | Admitting: Nurse Practitioner

## 2018-09-18 DIAGNOSIS — Z720 Tobacco use: Secondary | ICD-10-CM

## 2018-09-18 NOTE — Telephone Encounter (Signed)
Pt will call back to schedule appt she is currently at the [facility]

## 2018-09-18 NOTE — Telephone Encounter (Signed)
Please schedule patient for routine 3 month follow-up

## 2018-09-25 ENCOUNTER — Ambulatory Visit: Payer: BC Managed Care – PPO

## 2018-09-27 ENCOUNTER — Ambulatory Visit: Payer: BC Managed Care – PPO | Admitting: Urology

## 2019-01-21 ENCOUNTER — Emergency Department
Admission: EM | Admit: 2019-01-21 | Discharge: 2019-01-21 | Disposition: A | Discharge status: Home / Self Care | Payer: 59 | Attending: Emergency Medicine | Admitting: Emergency Medicine

## 2019-01-21 ENCOUNTER — Emergency Department: Payer: 59

## 2019-01-21 ENCOUNTER — Encounter: Payer: Self-pay | Admitting: Emergency Medicine

## 2019-01-21 ENCOUNTER — Other Ambulatory Visit: Payer: Self-pay

## 2019-01-21 DIAGNOSIS — R109 Unspecified abdominal pain: Secondary | ICD-10-CM

## 2019-01-21 DIAGNOSIS — R1032 Left lower quadrant pain: Secondary | ICD-10-CM | POA: Diagnosis not present

## 2019-01-21 DIAGNOSIS — Z79899 Other long term (current) drug therapy: Secondary | ICD-10-CM | POA: Insufficient documentation

## 2019-01-21 DIAGNOSIS — F17210 Nicotine dependence, cigarettes, uncomplicated: Secondary | ICD-10-CM | POA: Insufficient documentation

## 2019-01-21 LAB — CBC
HCT: 45.9 % (ref 36.0–46.0)
Hemoglobin: 15.6 g/dL — ABNORMAL HIGH (ref 12.0–15.0)
MCH: 30.4 pg (ref 26.0–34.0)
MCHC: 34 g/dL (ref 30.0–36.0)
MCV: 89.3 fL (ref 80.0–100.0)
Platelets: 252 10*3/uL (ref 150–400)
RBC: 5.14 MIL/uL — ABNORMAL HIGH (ref 3.87–5.11)
RDW: 12.8 % (ref 11.5–15.5)
WBC: 9.8 10*3/uL (ref 4.0–10.5)
nRBC: 0 % (ref 0.0–0.2)

## 2019-01-21 LAB — URINALYSIS, COMPLETE (UACMP) WITH MICROSCOPIC
Bacteria, UA: NONE SEEN
Bilirubin Urine: NEGATIVE
Glucose, UA: NEGATIVE mg/dL
Ketones, ur: NEGATIVE mg/dL
Leukocytes,Ua: NEGATIVE
Nitrite: NEGATIVE
Protein, ur: NEGATIVE mg/dL
Specific Gravity, Urine: 1.019 (ref 1.005–1.030)
pH: 6 (ref 5.0–8.0)

## 2019-01-21 LAB — COMPREHENSIVE METABOLIC PANEL
ALT: 36 U/L (ref 0–44)
AST: 31 U/L (ref 15–41)
Albumin: 3.8 g/dL (ref 3.5–5.0)
Alkaline Phosphatase: 76 U/L (ref 38–126)
Anion gap: 8 (ref 5–15)
BUN: 24 mg/dL — ABNORMAL HIGH (ref 6–20)
CO2: 26 mmol/L (ref 22–32)
Calcium: 9.4 mg/dL (ref 8.9–10.3)
Chloride: 106 mmol/L (ref 98–111)
Creatinine, Ser: 0.71 mg/dL (ref 0.44–1.00)
GFR calc Af Amer: >60 mL/min (ref >60)
GFR calc non Af Amer: >60 mL/min (ref >60)
Glucose, Bld: 124 mg/dL — ABNORMAL HIGH (ref 70–99)
Potassium: 3.6 mmol/L (ref 3.5–5.1)
Sodium: 140 mmol/L (ref 135–145)
Total Bilirubin: 0.5 mg/dL (ref 0.3–1.2)
Total Protein: 6.8 g/dL (ref 6.5–8.1)

## 2019-01-21 LAB — LIPASE, BLOOD: Lipase: 27 U/L (ref 11–51)

## 2019-01-21 MED ORDER — KETOROLAC TROMETHAMINE 30 MG/ML IJ SOLN
15.0000 mg | Freq: Once | INTRAMUSCULAR | Status: AC
  Administered 2019-01-21: 18:00:00 15 mg via INTRAVENOUS
  Filled 2019-01-21: qty 1

## 2019-01-21 MED ORDER — NAPROXEN 500 MG PO TABS: 500.0000 mg | ORAL_TABLET | Freq: Two times a day (BID) | ORAL | 0 refills | Status: DC

## 2019-01-21 MED ORDER — MORPHINE SULFATE (PF) 4 MG/ML IV SOLN
4.0000 mg | Freq: Once | INTRAVENOUS | Status: AC
  Administered 2019-01-21: 16:00:00 4 mg via INTRAVENOUS
  Filled 2019-01-21: qty 1

## 2019-01-21 MED ORDER — HYDROCODONE-ACETAMINOPHEN 5-325 MG PO TABS: 1.0000 | ORAL_TABLET | Freq: Four times a day (QID) | ORAL | 0 refills | Status: AC | PRN

## 2019-01-21 MED ORDER — OXYCODONE HCL 5 MG PO TABS
10.0000 mg | ORAL_TABLET | Freq: Once | ORAL | Status: AC
  Administered 2019-01-21: 10 mg via ORAL
  Filled 2019-01-21: qty 2

## 2019-01-21 MED ORDER — ONDANSETRON 4 MG PO TBDP: 4.0000 mg | ORAL_TABLET | Freq: Three times a day (TID) | ORAL | 0 refills | Status: DC | PRN

## 2019-01-21 MED ORDER — ONDANSETRON HCL 4 MG/2ML IJ SOLN
4.0000 mg | Freq: Once | INTRAMUSCULAR | Status: AC
  Administered 2019-01-21: 4 mg via INTRAVENOUS
  Filled 2019-01-21: qty 2

## 2019-01-21 MED ORDER — ORPHENADRINE CITRATE 30 MG/ML IJ SOLN
60.0000 mg | Freq: Two times a day (BID) | INTRAMUSCULAR | Status: DC
  Administered 2019-01-21: 60 mg via INTRAVENOUS
  Filled 2019-01-21: qty 2

## 2019-01-21 MED ORDER — SODIUM CHLORIDE 0.9 % IV BOLUS
1000.0000 mL | Freq: Once | INTRAVENOUS | Status: AC
  Administered 2019-01-21: 16:00:00 1000 mL via INTRAVENOUS

## 2019-01-21 NOTE — ED Provider Notes (Signed)
[facility] Emergency Department Provider Note ____________________________________________   First MD Initiated Contact with Patient 01/21/19 1458     (approximate)  I have reviewed the triage vital signs and the nursing notes.   HISTORY  Chief Complaint Abdominal Pain  HPI Sharon Wright is a 58 y.o. female presenting to the emergency department for treatment and evaluation of left flank pain that started about 3 days ago and has progressively worsened.  Pain is now also in the left lower abdomen/pelvic area.  Patient has attempted taking some old antibiotics and old tramadol that she had over at home without any relief.  She does have a history of kidney stones on the right side.  Pain is similar.  She denies hematuria or dysuria.   Past Medical History:  Diagnosis Date   History of kidney stones    still have 5 in right kidney   Microscopic hematuria    hx of due to kidney stones   Pelvic mass in female    Tobacco abuse 03/22/2017    Patient Active Problem List   Diagnosis Date Noted   Gross hematuria 06/29/2018   Pelvic mass in female 01/11/2018   Obesity (BMI 30.0-34.9) 08/16/2017   Tobacco abuse 03/22/2017    Past Surgical History:  Procedure Laterality Date   back syugery  1993   lower lumbar   Benign tumor  1993   BUNIONECTOMY Left 2006   CYSTOSCOPY/URETEROSCOPY/HOLMIUM LASER/STENT PLACEMENT Right 08/11/2018   Procedure: CYSTOSCOPY/URETEROSCOPY/HOLMIUM LASER/STENT PLACEMENT;  Surgeon: Billey Co, MD;  Location: [facility] OR[facility];  Service: Urology;  Laterality: Right;   LITHOTRIPSY     ROBOTIC ASSISTED BILATERAL SALPINGO OOPHERECTOMY Bilateral 02/06/2018   Procedure: XI ROBOTIC ASSISTED BILATERAL SALPINGO OOPHORECTOMY , PELVIC WASHINGS;  Surgeon: Isabel Caprice, MD;  Location: [place];  Service: Gynecology;  Laterality: Bilateral;   SKIN SURGERY  2008   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 Authorizing Provider  bismuth subsalicylate (PEPTO BISMOL) 262 MG chewable tablet Chew 2 tablets (524 mg total) by mouth 2 (two) times a day. 08/23/18   Poulose, Bethel Born, NP  buPROPion (WELLBUTRIN SR) 150 MG 12 hr tablet Take 1 tablet (150 mg total) by mouth 2 (two) times daily. TAKE 1 TABLET BY MOUTH EVERY DAY FOR 3 DAYS THEN TAKE 1 TABLET BY MOUTH TWICE A DAY THEREAFTER MAKE SURE SECOND DOSE IS BEFORE 4PM 09/18/18   Poulose, Bethel Born, NP  cholecalciferol (VITAMIN D) 1000 UNITS tablet Take 1,000 Units by mouth daily.     [provider]  HYDROcodone-acetaminophen (NORCO/VICODIN) 5-325 MG tablet Take 1 tablet by mouth every 6 (six) hours as needed for up to 3 days for severe pain. 01/21/19 01/24/19  Leman Martinek, Johnette Abraham B, FNP  naproxen (NAPROSYN) 500 MG tablet Take 1 tablet (500 mg total) by mouth 2 (two) times daily with a meal. 01/21/19   Arieonna Medine B, FNP  omeprazole (PRILOSEC) 20 MG capsule Take 1 capsule (20 mg total) by mouth 2 (two) times daily before a meal. 08/23/18 10/04/18  Poulose, Bethel Born, NP  ondansetron (ZOFRAN-ODT) 4 MG disintegrating tablet Take 1 tablet (4 mg total) by mouth every 8 (eight) hours as needed for nausea or vomiting. 01/21/19   Random Dobrowski, Johnette Abraham B, FNP  polyethylene glycol (MIRALAX / GLYCOLAX) packet Take 17 g by mouth daily as needed for mild constipation.     [provider]  Allergies Oxycodone and Penicillins  Family History  Problem Relation Age of Onset   Congestive Heart Failure Mother    Heart disease Mother    Uterine cancer Mother    Diabetes Father    Heart disease Father    Dementia Father    Heart disease Sister    Diabetes Sister     Social History Social History   Tobacco Use   Smoking status: Current Every Day Smoker    Packs/day: 1.00    Years: 40.00    Pack years: 40.00    Types: Cigarettes   Smokeless tobacco: Never Used  Substance  Use Topics   Alcohol use: No   Drug use: No    Review of Systems  Constitutional: No fever/chills Eyes: No visual changes. ENT: No sore throat. Cardiovascular: Denies chest pain. Respiratory: Denies shortness of breath. Gastrointestinal: Positive for abdominal pain.  Positive nausea, no vomiting.  No diarrhea.  No constipation. Genitourinary: Negative for dysuria.  Negative for hematuria Musculoskeletal: Positive for left-sided back pain Skin: Negative for rash. Neurological: Negative for headaches, focal weakness or numbness. ____________________________________________   PHYSICAL EXAM:  VITAL SIGNS: ED Triage Vitals  Enc Vitals Group     BP 01/21/19 1241 128/63     Pulse Rate 01/21/19 1241 84     Resp 01/21/19 1241 16     Temp 01/21/19 1241 98.1 F (36.7 C)     Temp Source 01/21/19 1241 Oral     SpO2 01/21/19 1241 94 %     Weight 01/21/19 1251 180 lb (81.6 kg)     Height 01/21/19 1251 5\' 9"  (1.753 m)     Head Circumference --      Peak Flow --      Pain Score 01/21/19 1251 10     Pain Loc --      Pain Edu? --      Excl. in [place]? --     Constitutional: Alert and oriented. Well appearing and in no acute distress. Eyes: Conjunctivae are normal. PERRL. EOMI. Head: Atraumatic. Nose: No congestion/rhinnorhea. Mouth/Throat: Mucous membranes are moist.  Oropharynx non-erythematous. Neck: No stridor.   Hematological/Lymphatic/Immunilogical: No cervical lymphadenopathy. Cardiovascular: Normal rate, regular rhythm. Grossly normal heart sounds.  Good peripheral circulation. Respiratory: Normal respiratory effort.  No retractions. Lungs CTAB. Gastrointestinal: Soft and tender on the left lower quadrant.  No guarding no distention. No abdominal bruits. No CVA tenderness, specifically on the left side. Genitourinary:  Musculoskeletal: No lower extremity tenderness nor edema.  No joint effusions. Neurologic:  Normal speech and language. No gross focal neurologic deficits are  appreciated. No gait instability. Skin:  Skin is warm, dry and intact. No rash noted. Psychiatric: Mood and affect are normal. Speech and behavior are normal.  ____________________________________________   LABS (all labs ordered are listed, but only abnormal results are displayed)  Labs Reviewed  COMPREHENSIVE METABOLIC PANEL - Abnormal; Notable for the following components:      Result Value   Glucose, Bld 124 (*)    BUN 24 (*)    All other components within normal limits  CBC - Abnormal; Notable for the following components:   RBC 5.14 (*)    Hemoglobin 15.6 (*)    All other components within normal limits  URINALYSIS, COMPLETE (UACMP) WITH MICROSCOPIC - Abnormal; Notable for the following components:   Color, Urine YELLOW (*)    APPearance CLEAR (*)    Hgb urine dipstick SMALL (*)    All other components within normal limits  LIPASE, BLOOD   ____________________________________________  EKG  Not indicated ____________________________________________  RADIOLOGY  ED MD interpretation:    Chest x-ray shows no acute findings per radiology.  CT for renal stone shows a very small nonobstructing stone in the lower pole of the right kidney.  Left kidney and ureter was without hydronephrosis or.  No frantic stranding or hematoma.  No dilatation or inflammation of the small bowel.  No acute findings of the abdomen and pelvis.  Official radiology report(s): Dg Chest 2 View  Result Date: 01/21/2019 CLINICAL DATA:  Mid back pain. Nausea. Bronchitis.  Smoker EXAM: CHEST - 2 VIEW COMPARISON:  08/16/2017 FINDINGS: Hyperinflation. Lateral view degraded by patient arm position. Midline trachea. Normal heart size. Atherosclerosis in the transverse aorta. No pleural effusion or pneumothorax. Calcified nodule versus bone island projecting over the anterior first right rib, similar. Diffuse peribronchial thickening. No lobar consolidation. IMPRESSION: COPD/chronic bronchitis, without acute  superimposed process. Electronically Signed   By: Abigail Miyamoto M.D.   On: 01/21/2019 16:48   Ct Renal Stone Study  Result Date: 01/21/2019 CLINICAL DATA:  Flank pain with stone disease suspected. Left lower quadrant pain since Friday. EXAM: CT ABDOMEN AND PELVIS WITHOUT CONTRAST TECHNIQUE: Multidetector CT imaging of the abdomen and pelvis was performed following the standard protocol without IV contrast. COMPARISON:  May 17, 2018. FINDINGS: Lower chest: The lung bases are clear. The heart size is normal. Hepatobiliary: The liver is normal. Normal gallbladder.There is no biliary ductal dilation. Pancreas: Normal contours without ductal dilatation. No peripancreatic fluid collection. Spleen: No splenic laceration or hematoma. Adrenals/Urinary Tract: --Adrenal glands: No adrenal hemorrhage. --Right kidney/ureter: There is a tiny nonobstructing stone in the lower pole the right kidney. --Left kidney/ureter: No hydronephrosis or perinephric hematoma. --Urinary bladder: Unremarkable. Stomach/Bowel: --Stomach/Duodenum: No hiatal hernia or other gastric abnormality. Normal duodenal course and caliber. --Small bowel: No dilatation or inflammation. --Colon: No focal abnormality. --Appendix: Normal. Vascular/Lymphatic: Atherosclerotic calcification is present within the non-aneurysmal abdominal aorta, without hemodynamically significant stenosis. --No retroperitoneal lymphadenopathy. --No mesenteric lymphadenopathy. --No pelvic or inguinal lymphadenopathy. Reproductive: Unremarkable Other: No ascites or free air. The abdominal wall is normal. Musculoskeletal. No acute displaced fractures. IMPRESSION: 1. No acute abdominopelvic abnormality. 2. Nonobstructive right nephrolithiasis. 3. Aortic atherosclerosis. Aortic Atherosclerosis (ICD10-I70.0). Electronically Signed   By: Constance Holster M.D.   On: 01/21/2019 15:35    ____________________________________________   PROCEDURES  Procedure(s) performed (including  Critical Care):  Procedures  ____________________________________________   INITIAL IMPRESSION / ASSESSMENT AND PLAN     58 year old female presenting to the emergency department for 3 days of left flank pain that is now radiating into the left lower abdomen/pelvic area.  While awaiting room assignment, urinalysis was collected which does show that she has 11-20 red blood cells and a small amount of hemoglobin in her urine.  No indication of acute cystitis.  Lipase is normal at 27.  BUN is mildly elevated at 24 but appears to be at her baseline.  Creatinine is stable at 0.71.  White blood cell count is normal at 9.8 without any indication of a left shift.  Plan will be to do a CT for renal stone and give IV fluids with morphine and Zofran for pain control nausea control.  DIFFERENTIAL DIAGNOSIS  Nephrolithiasis, ureterolithiasis, pyelonephritis, renal colic  ED COURSE  Patient given Morphine and zofran after returning from [place]. Little relief. No change in assessment or exam.  ----------------------------------------- 4:50 PM on 01/21/2019 -----------------------------------------  CT of her abdomen and pelvis is  negative for acute findings. No kidney stone or other concerns identified. Patient denies musculoskeletal strain or injury.She continues to complain of pain. Will try Toradol and Norflex.   -----------------------------------------  7:00 PM on 01/21/2019 -----------------------------------------  Labs, Chest x-ray, and CT of the abdomen and pelvis are all reassuring. Toradol and norflex seemed to provide some pain relief. She is "ready to go." Strict ER return precautions discussed. She is to call the GI specialist Monday morning and request a follow up appointment.  ____________________________________________   FINAL CLINICAL IMPRESSION(S) / ED DIAGNOSES  Final diagnoses:  Left lower quadrant abdominal pain  Acute flank pain     ED Discharge Orders         Ordered      ondansetron (ZOFRAN-ODT) 4 MG disintegrating tablet  Every 8 hours PRN     01/21/19 1836    naproxen (NAPROSYN) 500 MG tablet  2 times daily with meals     01/21/19 1836    HYDROcodone-acetaminophen (NORCO/VICODIN) 5-325 MG tablet  Every 6 hours PRN     01/21/19 1836           Note:  This document was prepared using Dragon voice recognition software and may include unintentional dictation errors.   Victorino Dike, FNP 01/21/19 Tyson Alias, MD 01/22/19 [phone]

## 2019-01-21 NOTE — Discharge Instructions (Signed)
If your pain worsens or you develop other symptoms of concern, please return right away to the emergency department.  Please call and schedule follow-up appointment with gastroenterologist.  Take the medications as prescribed.

## 2019-01-21 NOTE — ED Triage Notes (Addendum)
Pt arrived via POV with reports of LLQ abd pain since Friday, pt also c/o pain to middle of back that started since Friday.  Pt c/o nausea.  Pt has hx of kidney stone on the R  Pt reports she was recently dx with bronchitis and finished zpak on Friday.

## 2019-01-21 NOTE — ED Notes (Signed)
See provider assessment 

## 2019-01-22 ENCOUNTER — Emergency Department: Payer: 59

## 2019-01-22 ENCOUNTER — Other Ambulatory Visit: Payer: Self-pay

## 2019-01-22 ENCOUNTER — Emergency Department
Admission: EM | Admit: 2019-01-22 | Discharge: 2019-01-22 | Disposition: A | Discharge status: Home / Self Care | Payer: 59 | Attending: Emergency Medicine | Admitting: Emergency Medicine

## 2019-01-22 ENCOUNTER — Ambulatory Visit: Payer: Self-pay

## 2019-01-22 ENCOUNTER — Encounter: Payer: Self-pay | Admitting: Emergency Medicine

## 2019-01-22 DIAGNOSIS — R1032 Left lower quadrant pain: Secondary | ICD-10-CM | POA: Diagnosis present

## 2019-01-22 DIAGNOSIS — R05 Cough: Secondary | ICD-10-CM

## 2019-01-22 DIAGNOSIS — F17210 Nicotine dependence, cigarettes, uncomplicated: Secondary | ICD-10-CM | POA: Diagnosis not present

## 2019-01-22 DIAGNOSIS — J449 Chronic obstructive pulmonary disease, unspecified: Secondary | ICD-10-CM | POA: Insufficient documentation

## 2019-01-22 DIAGNOSIS — Z79899 Other long term (current) drug therapy: Secondary | ICD-10-CM | POA: Insufficient documentation

## 2019-01-22 DIAGNOSIS — R109 Unspecified abdominal pain: Secondary | ICD-10-CM

## 2019-01-22 DIAGNOSIS — R059 Cough, unspecified: Secondary | ICD-10-CM

## 2019-01-22 LAB — URINALYSIS, ROUTINE W REFLEX MICROSCOPIC
Bacteria, UA: NONE SEEN
Bilirubin Urine: NEGATIVE
Glucose, UA: NEGATIVE mg/dL
Ketones, ur: NEGATIVE mg/dL
Leukocytes,Ua: NEGATIVE
Nitrite: NEGATIVE
Protein, ur: NEGATIVE mg/dL
Specific Gravity, Urine: >1.046 — ABNORMAL HIGH (ref 1.005–1.030)
pH: 6 (ref 5.0–8.0)

## 2019-01-22 LAB — COMPREHENSIVE METABOLIC PANEL
ALT: 44 U/L (ref 0–44)
AST: 39 U/L (ref 15–41)
Albumin: 3.9 g/dL (ref 3.5–5.0)
Alkaline Phosphatase: 71 U/L (ref 38–126)
Anion gap: 9 (ref 5–15)
BUN: 20 mg/dL (ref 6–20)
CO2: 24 mmol/L (ref 22–32)
Calcium: 9.1 mg/dL (ref 8.9–10.3)
Chloride: 107 mmol/L (ref 98–111)
Creatinine, Ser: 0.46 mg/dL (ref 0.44–1.00)
GFR calc Af Amer: >60 mL/min (ref >60)
GFR calc non Af Amer: >60 mL/min (ref >60)
Glucose, Bld: 101 mg/dL — ABNORMAL HIGH (ref 70–99)
Potassium: 3.8 mmol/L (ref 3.5–5.1)
Sodium: 140 mmol/L (ref 135–145)
Total Bilirubin: 0.4 mg/dL (ref 0.3–1.2)
Total Protein: 6.8 g/dL (ref 6.5–8.1)

## 2019-01-22 LAB — CBC
HCT: 44.7 % (ref 36.0–46.0)
Hemoglobin: 15.7 g/dL — ABNORMAL HIGH (ref 12.0–15.0)
MCH: 30.4 pg (ref 26.0–34.0)
MCHC: 35.1 g/dL (ref 30.0–36.0)
MCV: 86.5 fL (ref 80.0–100.0)
Platelets: 241 10*3/uL (ref 150–400)
RBC: 5.17 MIL/uL — ABNORMAL HIGH (ref 3.87–5.11)
RDW: 13 % (ref 11.5–15.5)
WBC: 9.6 10*3/uL (ref 4.0–10.5)
nRBC: 0 % (ref 0.0–0.2)

## 2019-01-22 LAB — LIPASE, BLOOD: Lipase: 25 U/L (ref 11–51)

## 2019-01-22 MED ORDER — FENTANYL CITRATE (PF) 100 MCG/2ML IJ SOLN
50.0000 ug | Freq: Once | INTRAMUSCULAR | Status: AC
  Administered 2019-01-22: 50 ug via INTRAVENOUS
  Filled 2019-01-22: qty 2

## 2019-01-22 MED ORDER — SODIUM CHLORIDE 0.9% FLUSH: 3.0000 mL | Freq: Once | INTRAVENOUS | Status: DC

## 2019-01-22 MED ORDER — LIDOCAINE 5 % EX PTCH
1.0000 | MEDICATED_PATCH | CUTANEOUS | Status: DC
  Administered 2019-01-22: 1 via TRANSDERMAL
  Filled 2019-01-22: qty 1

## 2019-01-22 MED ORDER — ONDANSETRON HCL 4 MG/2ML IJ SOLN
4.0000 mg | Freq: Once | INTRAMUSCULAR | Status: AC
  Administered 2019-01-22: 4 mg via INTRAVENOUS
  Filled 2019-01-22: qty 2

## 2019-01-22 MED ORDER — PREDNISONE 20 MG PO TABS: 40.0000 mg | ORAL_TABLET | Freq: Every day | ORAL | 0 refills | Status: AC

## 2019-01-22 MED ORDER — IOHEXOL 300 MG/ML  SOLN
100.0000 mL | Freq: Once | INTRAMUSCULAR | Status: AC | PRN
  Administered 2019-01-22: 100 mL via INTRAVENOUS
  Filled 2019-01-22: qty 100

## 2019-01-22 MED ORDER — KETOROLAC TROMETHAMINE 30 MG/ML IJ SOLN
30.0000 mg | Freq: Once | INTRAMUSCULAR | Status: AC
  Administered 2019-01-22: 18:00:00 30 mg via INTRAVENOUS
  Filled 2019-01-22: qty 1

## 2019-01-22 NOTE — Telephone Encounter (Signed)
Documentation reviewed 

## 2019-01-22 NOTE — ED Triage Notes (Signed)
Pt here with c/o LLQ pain and left flank pain, was seen here for the same yesterday and diagnosed with inflammation in her bowel, back today due to pain and can't sleep, NAD.

## 2019-01-22 NOTE — Discharge Instructions (Addendum)
Your CT was as below.  You can get some over the counter lidocaine patches to help with the flank pain as well as a steroid course to help with your COPD.  You should take the medication already prescribed to you including the naproxen and Percocet return to the ER for worsening pain, vomiting, fevers , Shortness of breath or any other concerns  1. No CT evidence for acute intra-abdominal or pelvic abnormality. 2. 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

## 2019-01-22 NOTE — ED Provider Notes (Signed)
[facility] Emergency Department Provider Note  ____________________________________________   First MD Initiated Contact with Patient 01/22/19 1420     (approximate)  I have reviewed the triage vital signs and the nursing notes.   HISTORY  Chief Complaint Abdominal Pain and Flank Pain    HPI Sharon Wright is a 58 y.o. female with prior kidney stones, pelvic mass that she had her uterus and ovaries removed who presents with left lower quadrant abdominal pain.  Patient says that she is had this pain for about 1 week.  Is worse with moving around, better at rest, severe in nature.  She says it makes it difficult to ambulate.  Pain is more so on the left flank.  No midline lumbar tenderness.  No fevers or vomiting.  She was told to follow-up with GI but is not have an appointment until January and then she tried to follow-up with her PCP but given she has a cough with her known COPD they would not see her in the office and they told her to come back to the ER for her severe pain.  Patient also states that she was recently started on Z-Pak for some coughing.  She does have COPD and her coughing has been worse.  Denies any chest pain or shortness of breath.  Already had a chest x-ray done yesterday that did not show any pneumonia and is already had coronavirus testing that was negative.  She declines repeat chest x-ray today.           Past Medical History:  Diagnosis Date  . History of kidney stones    still have 5 in right kidney  . Microscopic hematuria    hx of due to kidney stones  . Pelvic mass in female   . Tobacco abuse 03/22/2017    Patient Active Problem List   Diagnosis Date Noted  . Gross hematuria 06/29/2018  . Pelvic mass in female 01/11/2018  . Obesity (BMI 30.0-34.9) 08/16/2017  . Tobacco abuse 03/22/2017    Past Surgical History:  Procedure Laterality Date  . back syugery  1993   lower lumbar  . Benign tumor  1993  .  BUNIONECTOMY Left 2006  . CYSTOSCOPY/URETEROSCOPY/HOLMIUM LASER/STENT PLACEMENT Right 08/11/2018   Procedure: CYSTOSCOPY/URETEROSCOPY/HOLMIUM LASER/STENT PLACEMENT;  Surgeon: Billey Co, MD;  Location: [facility];  Service: Urology;  Laterality: Right;  . LITHOTRIPSY    . ROBOTIC ASSISTED BILATERAL SALPINGO OOPHERECTOMY Bilateral 02/06/2018   Procedure: XI ROBOTIC ASSISTED BILATERAL SALPINGO OOPHORECTOMY , PELVIC WASHINGS;  Surgeon: Isabel Caprice, MD;  Location: [place];  Service: Gynecology;  Laterality: Bilateral;  . 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 Authorizing Provider  bismuth subsalicylate (PEPTO BISMOL) 262 MG chewable tablet Chew 2 tablets (524 mg total) by mouth 2 (two) times a day. 08/23/18   Poulose, Bethel Born, NP  buPROPion (WELLBUTRIN SR) 150 MG 12 hr tablet Take 1 tablet (150 mg total) by mouth 2 (two) times daily. TAKE 1 TABLET BY MOUTH EVERY DAY FOR 3 DAYS THEN TAKE 1 TABLET BY MOUTH TWICE A DAY THEREAFTER MAKE SURE SECOND DOSE IS BEFORE 4PM 09/18/18   Poulose, Bethel Born, NP  cholecalciferol (VITAMIN D) 1000 UNITS tablet Take 1,000 Units by mouth daily.     [provider]  HYDROcodone-acetaminophen (NORCO/VICODIN) 5-325 MG tablet Take 1 tablet by mouth every  6 (six) hours as needed for up to 3 days for severe pain. 01/21/19 01/24/19  Triplett, Johnette Abraham B, FNP  naproxen (NAPROSYN) 500 MG tablet Take 1 tablet (500 mg total) by mouth 2 (two) times daily with a meal. 01/21/19   Triplett, Cari B, FNP  omeprazole (PRILOSEC) 20 MG capsule Take 1 capsule (20 mg total) by mouth 2 (two) times daily before a meal. 08/23/18 10/04/18  Poulose, Bethel Born, NP  ondansetron (ZOFRAN-ODT) 4 MG disintegrating tablet Take 1 tablet (4 mg total) by mouth every 8 (eight) hours as needed for nausea or vomiting. 01/21/19   Triplett, Johnette Abraham B, FNP  polyethylene glycol (MIRALAX /  GLYCOLAX) packet Take 17 g by mouth daily as needed for mild constipation.     [provider]    Allergies Oxycodone and Penicillins  Family History  Problem Relation Age of Onset  . Congestive Heart Failure Mother   . Heart disease Mother   . Uterine cancer Mother   . Diabetes Father   . Heart disease Father   . Dementia Father   . Heart disease Sister   . Diabetes Sister     Social History Social History   Tobacco Use  . Smoking status: Current Every Day Smoker    Packs/day: 1.00    Years: 40.00    Pack years: 40.00    Types: Cigarettes  . Smokeless tobacco: Never Used  Substance Use Topics  . Alcohol use: No  . Drug use: No      Review of Systems Constitutional: No fever/chills Eyes: No visual changes. ENT: No sore throat. Cardiovascular: Denies chest pain. Respiratory: Denies shortness of breath.  Positive cough Gastrointestinal: Positive abdominal pain, no vomiting Genitourinary: Negative for dysuria. Musculoskeletal: Positive left flank pain Skin: Negative for rash. Neurological: Negative for headaches, focal weakness or numbness. All other ROS negative ____________________________________________   PHYSICAL EXAM:  VITAL SIGNS: ED Triage Vitals  Enc Vitals Group     BP 01/22/19 1239 130/77     Pulse Rate 01/22/19 1239 72     Resp 01/22/19 1239 16     Temp 01/22/19 1239 98.5 F (36.9 C)     Temp Source 01/22/19 1239 Oral     SpO2 01/22/19 1239 95 %     Weight --      Height --      Head Circumference --      Peak Flow --      Pain Score 01/22/19 1245 9     Pain Loc --      Pain Edu? --      Excl. in [place]? --     Constitutional: Alert and oriented. Well appearing and in no acute distress. Eyes: Conjunctivae are normal. EOMI. Head: Atraumatic. Nose: No congestion/rhinnorhea. Mouth/Throat: Mucous membranes are moist.   Neck: No stridor. Trachea Midline. FROM Cardiovascular: Normal rate, regular rhythm. Grossly normal heart  sounds.  Good peripheral circulation. Respiratory: Normal respiratory effort.  No retractions. Lungs CTAB. Gastrointestinal: Soft but tender in the left lower quadrant.  No distention. No abdominal bruits.  Musculoskeletal: No lower extremity tenderness nor edema.  No joint effusions.  Tenderness on the left flank.  No midline tenderness CT or L.  Neurologic:  Normal speech and language. No gross focal neurologic deficits are appreciated.  Skin:  Skin is warm, dry and intact. No rash noted. Psychiatric: Mood and affect are normal. Speech and behavior are normal. GU: Deferred   ____________________________________________   LABS (all labs ordered are  listed, but only abnormal results are displayed)  Labs Reviewed  COMPREHENSIVE METABOLIC PANEL - Abnormal; Notable for the following components:      Result Value   Glucose, Bld 101 (*)    All other components within normal limits  CBC - Abnormal; Notable for the following components:   RBC 5.17 (*)    Hemoglobin 15.7 (*)    All other components within normal limits  URINALYSIS, ROUTINE W REFLEX MICROSCOPIC - Abnormal; Notable for the following components:   Color, Urine YELLOW (*)    APPearance CLEAR (*)    Specific Gravity, Urine >1.046 (*)    Hgb urine dipstick SMALL (*)    All other components within normal limits  LIPASE, BLOOD   ____________________________________________   ED ECG REPORT I, Vanessa [place], the attending physician, personally viewed and interpreted this ECG.  EKG normal sinus rate of 62, no st elevation, no twi, normal intervals. ____________________________________________  RADIOLOGY   Official radiology report(s): Ct Abdomen Pelvis W Contrast  Result Date: 01/22/2019 CLINICAL DATA:  Abdomen pain EXAM: CT ABDOMEN AND PELVIS WITH CONTRAST TECHNIQUE: Multidetector CT imaging of the abdomen and pelvis was performed using the standard protocol following bolus administration of intravenous contrast.  CONTRAST:  182mL OMNIPAQUE IOHEXOL 300 MG/ML  SOLN COMPARISON:  01/21/2019 CT, 05/17/2018 FINDINGS: Lower chest: Lung bases demonstrate no acute consolidation or effusion. The heart size is within normal limits. Hepatobiliary: Multiple hypodense liver lesions, larger of which are consistent with cysts. Remaining are subcentimeter and too small to further characterize but grossly unchanged. No calcified gallstone or biliary dilatation Pancreas: No inflammatory change. Stable prominence of the pancreatic duct Spleen: Normal in size without focal abnormality. Adrenals/Urinary Tract: Adrenal glands are normal. Kidneys show no hydronephrosis. The urinary bladder is unremarkable. Stomach/Bowel: Stomach is within normal limits. Appendix appears normal. No evidence of bowel wall thickening, distention, or inflammatory changes. Few sigmoid colon diverticula without acute inflammatory change. Vascular/Lymphatic: Mild aortic atherosclerosis. No aneurysm. No significant adenopathy Reproductive: Uterus and bilateral adnexa are unremarkable. Other: Negative for free air or free fluid Musculoskeletal: Degenerative changes of the lumbar spine. No acute or suspicious osseous abnormality IMPRESSION: 1. No CT evidence for acute intra-abdominal or pelvic abnormality. 2. 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 Electronically Signed   By: Donavan Foil M.D.   On: 01/22/2019 15:59    ____________________________________________   PROCEDURES  Procedure(s) performed (including Critical Care):  Procedures   ____________________________________________   INITIAL IMPRESSION / ASSESSMENT AND PLAN / ED COURSE  Sharon Wright was evaluated in Emergency Department on 01/22/2019 for the symptoms described in the history of present illness. She was evaluated in the context of the global COVID-19 pandemic, which necessitated consideration that the patient might be at risk for  infection with the SARS-CoV-2 virus that causes COVID-19. Institutional protocols and algorithms that pertain to the evaluation of patients at risk for COVID-19 are in a state of rapid change based on information released by regulatory bodies including the CDC and federal and state organizations. These policies and algorithms were followed during the patient's care in the ED.    Pt presents with LLQ pain and left flank pain. CT without negative yesterday. Will get CT with to evaluate for SBO, diverticulitis, AAA. Seems more MSK in nature. No rash on the skin.  Does not have midline T/L spine tenderness.  No symptoms suggest cord compression.  For cough, xray negative for PNA yesterday. Denies chest pain/sob to  suggest PE/PNA. Recent Covid test was negative.  We will give a course of steroids to help with the cough given her COPD history although she is not overtly wheezing on exam.  EKG without evidence of STEMI and again patient has no shortness of breath to suggest ACS.  Labs are reassuring with normal white count.  No evidence of anemia.  LFTs are normal.  Lipase is normal.  CT with contrast was negative.  Patient was able to ambulate after the pain medication.  Patient feels comfortable going home with symptomatic treatment for the back pain as well as some steroids to help with her COPD and her cough.  She will return to ER if she develops chest pain, shortness of breath or any other concerns.  ____________________________________________   FINAL CLINICAL IMPRESSION(S) / ED DIAGNOSES   Final diagnoses:  Cough  Left flank pain      MEDICATIONS GIVEN DURING THIS VISIT:  Medications  lidocaine (LIDODERM) 5 % 1 patch (1 patch Transdermal Patch Applied 01/22/19 1536)  ondansetron (ZOFRAN) injection 4 mg (4 mg Intravenous Given 01/22/19 1535)  fentaNYL (SUBLIMAZE) injection 50 mcg (50 mcg Intravenous Given 01/22/19 1536)  iohexol (OMNIPAQUE) 300 MG/ML solution 100 mL (100 mLs  Intravenous Contrast Given 01/22/19 1545)  ketorolac (TORADOL) 30 MG/ML injection 30 mg (30 mg Intravenous Given 01/22/19 1749)     ED Discharge Orders    None       Note:  This document was prepared using Dragon voice recognition software and may include unintentional dictation errors.   Vanessa Manton, MD 01/22/19 [phone]

## 2019-01-22 NOTE — ED Notes (Signed)
Pt reports pain to her LLQ and left lower back since Thursday. Pt reports was seen here yesterday and advised to follow up with a GI MD. Pt reports she could not get an appointment until January so she called her doctor at [facility] and was advised to come back to the ED. Pt reports nausea but denies vomiting, diarrhea or fevers.

## 2019-01-22 NOTE — ED Notes (Signed)
Patient transported to CT 

## 2019-01-22 NOTE — Telephone Encounter (Signed)
Patient called stating that she was in the Er yesterday for severe pain left side that travels to her back. She states they found some inflammation in her bowel and blood in her urine. No sign of kidney stone on left side.  She describes the pain as severe unable to walk when it (pain) hits.  It is intermittent.  She has been treated for bronchitis at [facility] and has completed a Z-pac.  She has no fever just pain. Per protocol patient will go to ER for evaluation today. Care advice read to patient and she verbalized understanding.  Reason for Disposition . [1] SEVERE pain (e.g., excruciating) AND [2] present > 1 hour  Answer Assessment - Initial Assessment Questions 1. LOCATION: "Where does it hurt?"      Left side 2. RADIATION: "Does the pain shoot anywhere else?" (e.g., chest, back)    Mid back 3. ONSET: "When did the pain begin?" (e.g., minutes, hours or days ago)      Thursday evening 4. SUDDEN: "Gradual or sudden onset?"     gradual 5. PATTERN "Does the pain come and go, or is it constant?"    - If constant: "Is it getting better, staying the same, or worsening?"      (Note: Constant means the pain never goes away completely; most serious pain is constant and it progresses)     - If intermittent: "How long does it last?" "Do you have pain now?"     (Note: Intermittent means the pain goes away completely between bouts)     Comes and goes 6. SEVERITY: "How bad is the pain?"  (e.g., Scale 1-10; mild, moderate, or severe)   - MILD (1-3): doesn't interfere with normal activities, abdomen soft and not tender to touch    - MODERATE (4-7): interferes with normal activities or awakens from sleep, tender to touch    - SEVERE (8-10): excruciating pain, doubled over, unable to do any normal activities     8-10 unable to walk  7. RECURRENT SYMPTOM: "Have you ever had this type of abdominal pain before?" If so, ask: "When was the last time?" and "What happened that time?"      Yes  8. CAUSE:  "What do you think is causing the abdominal pain?"    Unsure has not had BM in two days 9. RELIEVING/AGGRAVATING FACTORS: "What makes it better or worse?" (e.g., movement, antacids, bowel movement)     no 10. OTHER SYMPTOMS: "Has there been any vomiting, diarrhea, constipation, or urine problems?"       Cold symptoms has cough 11. PREGNANCY: "Is there any chance you are pregnant?" "When was your last menstrual period?"      N/A  Protocols used: ABDOMINAL PAIN - [facility]

## 2019-03-19 ENCOUNTER — Other Ambulatory Visit: Payer: Self-pay

## 2019-03-19 ENCOUNTER — Encounter: Payer: Self-pay | Admitting: Gastroenterology

## 2019-03-19 ENCOUNTER — Ambulatory Visit: Payer: 59 | Admitting: Gastroenterology

## 2019-03-19 VITALS — BP 120/80 | HR 71 | Temp 97.6°F | Resp 17 | Ht 69.0 in | Wt 206.4 lb

## 2019-03-19 DIAGNOSIS — Z86010 Personal history of colonic polyps: Secondary | ICD-10-CM

## 2019-03-19 DIAGNOSIS — K5909 Other constipation: Secondary | ICD-10-CM

## 2019-03-19 DIAGNOSIS — G8929 Other chronic pain: Secondary | ICD-10-CM

## 2019-03-19 DIAGNOSIS — Z1211 Encounter for screening for malignant neoplasm of colon: Secondary | ICD-10-CM

## 2019-03-19 DIAGNOSIS — R1032 Left lower quadrant pain: Secondary | ICD-10-CM | POA: Diagnosis not present

## 2019-03-19 NOTE — Progress Notes (Signed)
Cephas Darby, MD [address]  [place]  [place], [place] [address]  Main: [phone]  Fax: [phone]    Gastroenterology Consultation  Referring Provider:     Arnetha Courser, MD Primary Care Physician:  [place] Primary Gastroenterologist:  Dr. Cephas Darby Reason for Consultation: Left lower quadrant pain        HPI:   Sharon Wright is a 59 y.o. female referred by Dr. Lucinda Dell, Pa  for consultation & management of left lower quadrant pain.  Patient went to ER on 11/23 secondary to left lower quadrant pain.  She underwent CT abdomen pelvis which revealed sigmoid diverticulosis, multiple liver cysts.  She had history of urolithiasis in her right kidney, status post lithotripsy, history of pelvic mass underwent hysterectomy with salpingo-oophorectomy in 2019.  In fact, she went to ER twice and was given pain medication.  Subsequently, due to persistent pain she went to urgent care and was given 2 weeks course of Cipro and Flagyl which she completed.  Patient has gained about 20 pounds in the last 6 months by eating fried bread, candy and does consume carbonated beverages, red meat regularly.  She reports that her constipation has gotten worse and has to take MiraLAX as needed.  She said it took about 1 month since taking antibiotics that her left lower quadrant pain has finally improved.  She still feels tender in lower abdomen. She does smoke cigarettes She works for a [employer]  NSAIDs: None  Antiplts/Anticoagulants/Anti thrombotics: None  GI Procedures: Had a colonoscopy at age 18 and found to have 2 polyps She denies family history of GI malignancy  Past Medical History:  Diagnosis Date  . History of kidney stones    still have 5 in right kidney  . Microscopic hematuria    hx of due to kidney stones  . Pelvic mass in female   . Tobacco abuse 03/22/2017    Past Surgical History:  Procedure Laterality Date   . back syugery  1993   lower lumbar  . Benign tumor  1993  . BUNIONECTOMY Left 2006  . CYSTOSCOPY/URETEROSCOPY/HOLMIUM LASER/STENT PLACEMENT Right 08/11/2018   Procedure: CYSTOSCOPY/URETEROSCOPY/HOLMIUM LASER/STENT PLACEMENT;  Surgeon: Billey Co, MD;  Location: [facility];  Service: Urology;  Laterality: Right;  . LITHOTRIPSY    . ROBOTIC ASSISTED BILATERAL SALPINGO OOPHERECTOMY Bilateral 02/06/2018   Procedure: XI ROBOTIC ASSISTED BILATERAL SALPINGO OOPHORECTOMY , PELVIC WASHINGS;  Surgeon: Isabel Caprice, MD;  Location: [place];  Service: Gynecology;  Laterality: Bilateral;  . 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  cholecalciferol (VITAMIN D) 1000 UNITS tablet, Take 1,000 Units by mouth daily. , Disp: , Rfl:  .  omeprazole (PRILOSEC) 20 MG capsule, Take 1 capsule (20 mg total) by mouth 2 (two) times daily before a meal., Disp: 84 capsule, Rfl: 0   Family History  Problem Relation Age of Onset  . Congestive Heart Failure Mother   . Heart disease Mother   . Uterine cancer Mother   . Diabetes Father   . Heart disease Father   . Dementia Father   . Heart disease Sister   . Diabetes Sister      Social History   Tobacco Use  . Smoking status: Current Every Day Smoker    Packs/day: 1.00    Years: 40.00    Pack years: 40.00  Types: Cigarettes  . Smokeless tobacco: Never Used  Substance Use Topics  . Alcohol use: No  . Drug use: No    Allergies as of 03/19/2019 - Review Complete 03/19/2019  Allergen Reaction Noted  . Oxycodone Nausea And Vomiting 02/05/2013  . Penicillins Rash and Other (See Comments) 10/29/2011    Review of Systems:    All systems reviewed and negative except where noted in HPI.   Physical Exam:  BP 120/80 (BP Location: Left Arm, Patient Position: Sitting, Cuff Size: Large)   Pulse 71   Temp 97.6 F (36.4 C)   Resp 17   Ht 5\' 9"  (1.753 m)   Wt 206 lb 6.4 oz (93.6 kg)    BMI 30.48 kg/m  No LMP recorded. Patient is postmenopausal.  General:   Alert,  Well-developed, well-nourished, pleasant and cooperative in NAD Head:  Normocephalic and atraumatic. Eyes:  Sclera clear, no icterus.   Conjunctiva pink. Ears:  Normal auditory acuity. Nose:  No deformity, discharge, or lesions. Mouth:  No deformity or lesions,oropharynx pink & moist. Neck:  Supple; no masses or thyromegaly. Lungs:  Respirations even and unlabored.  Clear throughout to auscultation.   No wheezes, crackles, or rhonchi. No acute distress. Heart:  Regular rate and rhythm; no murmurs, clicks, rubs, or gallops. Abdomen:  Normal bowel sounds. Soft, non-tender and non-distended without masses, hepatosplenomegaly or hernias noted.  No guarding or rebound tenderness.   Rectal: Not performed Msk:  Symmetrical without gross deformities. Good, equal movement & strength bilaterally. Pulses:  Normal pulses noted. Extremities:  No clubbing or edema.  No cyanosis. Neurologic:  Alert and oriented x3;  grossly normal neurologically. Skin:  Intact without significant lesions or rashes. No jaundice. Psych:  Alert and cooperative. Normal mood and affect.  Imaging Studies: Reviewed  Assessment and Plan:   KRISALYN MARCHETTA is a 59 y.o. female with no significant past medical history, history of nephrolithiasis, sigmoid diverticulosis, s/p hysterectomy and bilateral salpingo-oophorectomy is seen in consultation for chronic constipation and chronic left lower quadrant pain  Chronic left lower quadrant pain most likely secondary to severe constipation: Significantly improved No evidence of diverticulitis on the CT scan Empirically treated with 2 weeks course of Cipro and Flagyl Reiterated on high-fiber diet and continue stool softener as needed Trial of fiber supplements if needed, information provided  Colon cancer screening, history of colon polyps Schedule surveillance colonoscopy   Follow up as needed    Cephas Darby, MD

## 2019-03-19 NOTE — Patient Instructions (Signed)

## 2019-03-21 ENCOUNTER — Telehealth: Payer: Self-pay

## 2019-03-21 NOTE — Telephone Encounter (Signed)
Discussed rescheduling colonoscopy with patient.  She is agreeable to rescheduling from 04/02/19 with [facility] at [facility] to 04/17/19 [place] with [facility].  Advise of new COVID test date Friday 02/12.  New instructions will be sent to her via mychart. [place] has been notified of date and location change.  Referral updated.  Thanks [employer]

## 2019-04-11 ENCOUNTER — Telehealth: Payer: Self-pay | Admitting: Gastroenterology

## 2019-04-11 NOTE — Telephone Encounter (Signed)
Patient called & l/m on v/m stating she has a colonoscopy on 04-17-2019 & would like to know when she goes for her Covid test. Will Friday be ok? She isn'e able to take messages at work so has ask we text her the information at [phone].

## 2019-04-12 ENCOUNTER — Encounter: Payer: Self-pay | Admitting: Gastroenterology

## 2019-04-12 ENCOUNTER — Other Ambulatory Visit: Payer: Self-pay

## 2019-04-12 NOTE — Telephone Encounter (Signed)
Attempted to return patients call.  Unable to LVM because mailbox is full.  I will send her a mychart message to let her know she can go for COVID test on Friday.  Thanks,  [place], [place]

## 2019-04-13 ENCOUNTER — Other Ambulatory Visit
Admission: RE | Admit: 2019-04-13 | Discharge: 2019-04-13 | Disposition: A | Discharge status: Home / Self Care | Payer: 59 | Source: Ambulatory Visit | Attending: Gastroenterology | Admitting: Gastroenterology

## 2019-04-13 DIAGNOSIS — Z01812 Encounter for preprocedural laboratory examination: Secondary | ICD-10-CM | POA: Diagnosis present

## 2019-04-13 DIAGNOSIS — Z20822 Contact with and (suspected) exposure to covid-19: Secondary | ICD-10-CM | POA: Insufficient documentation

## 2019-04-13 NOTE — Discharge Instructions (Signed)
General Anesthesia, Adult, Care After This sheet gives you information about how to care for yourself after your procedure. Your health care provider may also give you more specific instructions. If you have problems or questions, contact your health care provider. What can I expect after the procedure? After the procedure, the following side effects are common:  Pain or discomfort at the IV site.  Nausea.  Vomiting.  Sore throat.  Trouble concentrating.  Feeling cold or chills.  Weak or tired.  Sleepiness and fatigue.  Soreness and body aches. These side effects can affect parts of the body that were not involved in surgery. Follow these instructions at home:  For at least 24 hours after the procedure:  Have a responsible adult stay with you. It is important to have someone help care for you until you are awake and alert.  Rest as needed.  Do not: ? Participate in activities in which you could fall or become injured. ? Drive. ? Use heavy machinery. ? Drink alcohol. ? Take sleeping pills or medicines that cause drowsiness. ? Make important decisions or sign legal documents. ? Take care of children on your own. Eating and drinking  Follow any instructions from your health care provider about eating or drinking restrictions.  When you feel hungry, start by eating small amounts of foods that are soft and easy to digest (bland), such as toast. Gradually return to your regular diet.  Drink enough fluid to keep your urine pale yellow.  If you vomit, rehydrate by drinking water, juice, or clear broth. General instructions  If you have sleep apnea, surgery and certain medicines can increase your risk for breathing problems. Follow instructions from your health care provider about wearing your sleep device: ? Anytime you are sleeping, including during daytime naps. ? While taking prescription pain medicines, sleeping medicines, or medicines that make you drowsy.  Return to  your normal activities as told by your health care provider. Ask your health care provider what activities are safe for you.  Take over-the-counter and prescription medicines only as told by your health care provider.  If you smoke, do not smoke without supervision.  Keep all follow-up visits as told by your health care provider. This is important. Contact a health care provider if:  You have nausea or vomiting that does not get better with medicine.  You cannot eat or drink without vomiting.  You have pain that does not get better with medicine.  You are unable to pass urine.  You develop a skin rash.  You have a fever.  You have redness around your IV site that gets worse. Get help right away if:  You have difficulty breathing.  You have chest pain.  You have blood in your urine or stool, or you vomit blood. Summary  After the procedure, it is common to have a sore throat or nausea. It is also common to feel tired.  Have a responsible adult stay with you for the first 24 hours after general anesthesia. It is important to have someone help care for you until you are awake and alert.  When you feel hungry, start by eating small amounts of foods that are soft and easy to digest (bland), such as toast. Gradually return to your regular diet.  Drink enough fluid to keep your urine pale yellow.  Return to your normal activities as told by your health care provider. Ask your health care provider what activities are safe for you. This information is not   intended to replace advice given to you by your health care provider. Make sure you discuss any questions you have with your health care provider. Document Revised: 02/18/2017 Document Reviewed: 10/01/2016 Elsevier Patient Education  2020 Elsevier Inc.  

## 2019-04-14 LAB — SARS CORONAVIRUS 2 (TAT 6-24 HRS): SARS Coronavirus 2: NEGATIVE

## 2019-04-17 ENCOUNTER — Ambulatory Visit
Admission: RE | Admit: 2019-04-17 | Discharge: 2019-04-17 | Disposition: A | Discharge status: Home / Self Care | Payer: 59 | Attending: Gastroenterology | Admitting: Gastroenterology

## 2019-04-17 ENCOUNTER — Other Ambulatory Visit: Payer: Self-pay

## 2019-04-17 ENCOUNTER — Encounter
Admission: RE | Disposition: A | Discharge status: Home / Self Care | Payer: Self-pay | Source: Home / Self Care | Attending: Gastroenterology

## 2019-04-17 ENCOUNTER — Ambulatory Visit: Payer: 59 | Admitting: Anesthesiology

## 2019-04-17 ENCOUNTER — Encounter: Payer: Self-pay | Admitting: Gastroenterology

## 2019-04-17 DIAGNOSIS — F17210 Nicotine dependence, cigarettes, uncomplicated: Secondary | ICD-10-CM | POA: Diagnosis not present

## 2019-04-17 DIAGNOSIS — K5730 Diverticulosis of large intestine without perforation or abscess without bleeding: Secondary | ICD-10-CM | POA: Insufficient documentation

## 2019-04-17 DIAGNOSIS — K635 Polyp of colon: Secondary | ICD-10-CM | POA: Insufficient documentation

## 2019-04-17 DIAGNOSIS — Z860100 Personal history of colon polyps, unspecified: Secondary | ICD-10-CM

## 2019-04-17 DIAGNOSIS — Z1211 Encounter for screening for malignant neoplasm of colon: Secondary | ICD-10-CM | POA: Insufficient documentation

## 2019-04-17 HISTORY — DX: Unspecified osteoarthritis, unspecified site: M19.90

## 2019-04-17 HISTORY — PX: POLYPECTOMY: SHX5525

## 2019-04-17 HISTORY — PX: COLONOSCOPY WITH PROPOFOL: SHX5780

## 2019-04-17 SURGERY — COLONOSCOPY WITH PROPOFOL
Anesthesia: General | Site: Rectum

## 2019-04-17 MED ORDER — PROPOFOL 10 MG/ML IV BOLUS
INTRAVENOUS | Status: DC | PRN
  Administered 2019-04-17: 20 mg via INTRAVENOUS
  Administered 2019-04-17: 30 mg via INTRAVENOUS
  Administered 2019-04-17: 20 mg via INTRAVENOUS
  Administered 2019-04-17: 50 mg via INTRAVENOUS
  Administered 2019-04-17: 20 mg via INTRAVENOUS
  Administered 2019-04-17: 40 mg via INTRAVENOUS
  Administered 2019-04-17: 20 mg via INTRAVENOUS
  Administered 2019-04-17: 80 mg via INTRAVENOUS
  Administered 2019-04-17 (×3): 20 mg via INTRAVENOUS

## 2019-04-17 MED ORDER — LACTATED RINGERS IV SOLN: INTRAVENOUS | Status: DC

## 2019-04-17 MED ORDER — STERILE WATER FOR IRRIGATION IR SOLN
Status: DC | PRN
  Administered 2019-04-17: 100 mL

## 2019-04-17 MED ORDER — LIDOCAINE HCL (CARDIAC) PF 100 MG/5ML IV SOSY
PREFILLED_SYRINGE | INTRAVENOUS | Status: DC | PRN
  Administered 2019-04-17: 50 mg via INTRAVENOUS

## 2019-04-17 SURGICAL SUPPLY — 25 items
CANISTER SUCT 1200ML W/VALVE (MISCELLANEOUS) ×4 IMPLANT
CLIP HMST 235XBRD CATH ROT (MISCELLANEOUS) IMPLANT
CLIP RESOLUTION 360 11X235 (MISCELLANEOUS)
ELECT REM PT RETURN 9FT ADLT (ELECTROSURGICAL)
ELECTRODE REM PT RTRN 9FT ADLT (ELECTROSURGICAL) IMPLANT
FCP ESCP3.2XJMB 240X2.8X (MISCELLANEOUS)
FORCEPS BIOP RAD 4 LRG CAP 4 (CUTTING FORCEPS) IMPLANT
FORCEPS BIOP RJ4 240 W/NDL (MISCELLANEOUS)
FORCEPS ESCP3.2XJMB 240X2.8X (MISCELLANEOUS) IMPLANT
GOWN CVR UNV OPN BCK APRN NK (MISCELLANEOUS) ×4 IMPLANT
GOWN ISOL THUMB LOOP REG UNIV (MISCELLANEOUS) ×4
INJECTOR VARIJECT VIN23 (MISCELLANEOUS) IMPLANT
KIT DEFENDO VALVE AND CONN (KITS) IMPLANT
KIT ENDO PROCEDURE OLY (KITS) ×4 IMPLANT
MARKER SPOT ENDO TATTOO 5ML (MISCELLANEOUS) IMPLANT
PROBE APC STR FIRE (PROBE) IMPLANT
RETRIEVER NET ROTH 2.5X230 LF (MISCELLANEOUS) ×4 IMPLANT
SNARE COLD EXACTO (MISCELLANEOUS) ×4 IMPLANT
SNARE SHORT THROW 13M SML OVAL (MISCELLANEOUS) IMPLANT
SNARE SHORT THROW 30M LRG OVAL (MISCELLANEOUS) IMPLANT
SNARE SNG USE RND 15MM (INSTRUMENTS) IMPLANT
SPOT EX ENDOSCOPIC TATTOO (MISCELLANEOUS)
TRAP ETRAP POLY (MISCELLANEOUS) IMPLANT
VARIJECT INJECTOR VIN23 (MISCELLANEOUS)
WATER STERILE IRR 250ML POUR (IV SOLUTION) ×4 IMPLANT

## 2019-04-17 NOTE — H&P (Signed)
Cephas Darby, MD [address]  [place]  [place], [place] [address]  Main: [phone]  Fax: [phone] Pager: [phone]  Primary Care Physician:  [facility] [place], [place] Primary Gastroenterologist:  Dr. Cephas Darby  Pre-Procedure History & Physical: HPI:  NAIDELY BUECHEL is a 59 y.o. female is here for an colonoscopy.   Past Medical History:  Diagnosis Date  . Arthritis    hands, lower back  . Gross hematuria 06/29/2018  . History of kidney stones    still have 5 in right kidney  . Microscopic hematuria    hx of due to kidney stones  . Pelvic mass in female   . Pelvic mass in female 01/11/2018  . Tobacco abuse 03/22/2017    Past Surgical History:  Procedure Laterality Date  . back syugery  1993   lower lumbar  . Benign tumor  1993  . BUNIONECTOMY Left 2006  . CYSTOSCOPY/URETEROSCOPY/HOLMIUM LASER/STENT PLACEMENT Right 08/11/2018   Procedure: CYSTOSCOPY/URETEROSCOPY/HOLMIUM LASER/STENT PLACEMENT;  Surgeon: Billey Co, MD;  Location: [facility];  Service: Urology;  Laterality: Right;  . LITHOTRIPSY    . ROBOTIC ASSISTED BILATERAL SALPINGO OOPHERECTOMY Bilateral 02/06/2018   Procedure: XI ROBOTIC ASSISTED BILATERAL SALPINGO OOPHORECTOMY , PELVIC WASHINGS;  Surgeon: Isabel Caprice, MD;  Location: [facility];  Service: Gynecology;  Laterality: Bilateral;  . 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 Authorizing Provider  cholecalciferol (VITAMIN D) 1000 UNITS tablet Take 1,000 Units by mouth daily.    Yes [provider]    Allergies as of 03/19/2019 - Review Complete 03/19/2019  Allergen Reaction Noted  . Oxycodone Nausea And Vomiting 02/05/2013  . Penicillins Rash and Other (See Comments) 10/29/2011    Family History  Problem Relation Age of Onset  . Congestive Heart Failure Mother   . Heart disease Mother   . Uterine  cancer Mother   . Diabetes Father   . Heart disease Father   . Dementia Father   . Heart disease Sister   . Diabetes Sister     Social History   Socioeconomic History  . Marital status: Married    Spouse name: Carloyn Manner  . Number of children: 2  . Years of education: Not on file  . Highest education level: Associate degree: occupational, Hotel manager, or vocational program  Occupational History  . Not on file  Tobacco Use  . Smoking status: Current Every Day Smoker    Packs/day: 1.00    Years: 40.00    Pack years: 40.00    Types: Cigarettes  . Smokeless tobacco: Never Used  . Tobacco comment: since age 41  Substance and Sexual Activity  . Alcohol use: No  . Drug use: No  . Sexual activity: Yes    Partners: Male    Birth control/protection: Post-menopausal  Other Topics Concern  . Not on file  Social History Narrative  . Not on file   Social Determinants of Health   Financial Resource Strain: Low Risk   . Difficulty of Paying Living Expenses: Not hard at all  Food Insecurity: No Food Insecurity  . Worried About Charity fundraiser in the Last Year: Never true  . Ran Out of Food in the Last Year: Never true  Transportation Needs: No Transportation Needs  . Lack of Transportation (Medical): No  . Lack of Transportation (Non-Medical): No  Physical Activity:  Sufficiently Active  . Days of Exercise per Week: 4 days  . Minutes of Exercise per Session: 150+ min  Stress: No Stress Concern Present  . Feeling of Stress : Not at all  Social Connections: Slightly Isolated  . Frequency of Communication with Friends and Family: More than three times a week  . Frequency of Social Gatherings with Friends and Family: More than three times a week  . Attends Religious Services: More than 4 times per year  . Active Member of Clubs or Organizations: No  . Attends Archivist Meetings: Never  . Marital Status: Married  Human resources officer Violence: Not At Risk  . Fear of Current or  Ex-Partner: No  . Emotionally Abused: No  . Physically Abused: No  . Sexually Abused: No    Review of Systems: See HPI, otherwise negative ROS  Physical Exam: BP (!) 109/46   Pulse 73   Temp 97.9 F (36.6 C) (Temporal)   Ht 5\' 9"  (1.753 m)   Wt 92.1 kg   SpO2 100%   BMI 29.98 kg/m  General:   Alert,  pleasant and cooperative in NAD Head:  Normocephalic and atraumatic. Neck:  Supple; no masses or thyromegaly. Lungs:  Clear throughout to auscultation.    Heart:  Regular rate and rhythm. Abdomen:  Soft, nontender and nondistended. Normal bowel sounds, without guarding, and without rebound.   Neurologic:  Alert and  oriented x4;  grossly normal neurologically.  Impression/Plan: KEMARA SAKS is here for an colonoscopy to be performed for h/o colon polyps  Risks, benefits, limitations, and alternatives regarding  colonoscopy have been reviewed with the patient.  Questions have been answered.  All parties agreeable.   Sherri Sear, MD  04/17/2019, 9:43 AM

## 2019-04-17 NOTE — Anesthesia Procedure Notes (Signed)
Performed by: Tarick Parenteau, CRNA Pre-anesthesia Checklist: Patient identified, Emergency Drugs available, Suction available, Timeout performed and Patient being monitored Patient Re-evaluated:Patient Re-evaluated prior to induction Oxygen Delivery Method: Nasal cannula Placement Confirmation: positive ETCO2       

## 2019-04-17 NOTE — Transfer of Care (Signed)
Immediate Anesthesia Transfer of Care Note  Patient: Sharon Wright  Procedure(s) Performed: COLONOSCOPY WITH PROPOFOL (N/A Rectum) POLYPECTOMY (Rectum)  Patient Location: PACU  Anesthesia Type: General  Level of Consciousness: awake, alert  and patient cooperative  Airway and Oxygen Therapy: Patient Spontanous Breathing and Patient connected to supplemental oxygen  Post-op Assessment: Post-op Vital signs reviewed, Patient's Cardiovascular Status Stable, Respiratory Function Stable, Patent Airway and No signs of Nausea or vomiting  Post-op Vital Signs: Reviewed and stable  Complications: No apparent anesthesia complications

## 2019-04-17 NOTE — Anesthesia Postprocedure Evaluation (Signed)
Anesthesia Post Note  Patient: Sharon Wright  Procedure(s) Performed: COLONOSCOPY WITH PROPOFOL (N/A Rectum) POLYPECTOMY (Rectum)     Patient location during evaluation: PACU Anesthesia Type: General Level of consciousness: awake and alert Pain management: pain level controlled Vital Signs Assessment: post-procedure vital signs reviewed and stable Respiratory status: spontaneous breathing, nonlabored ventilation, respiratory function stable and patient connected to nasal cannula oxygen Cardiovascular status: blood pressure returned to baseline and stable Postop Assessment: no apparent nausea or vomiting Anesthetic complications: no    Alisa Graff

## 2019-04-17 NOTE — Op Note (Signed)
[facility] Patient Name: Sharon Wright Procedure Date: 04/17/2019 10:23 AM MRN: [id number] Account #: [id number] Date of Birth: 29-Jan-1961 Admit Type: Outpatient Age: 59 Room: [facility] OR ROOM 01 Gender: Female Note Status: Finalized Procedure:             Colonoscopy Indications:           High risk colon cancer surveillance: Personal history                         of colonic polyps Providers:             Lin Landsman MD, MD Referring MD:          Jemison (Referring MD) Medicines:             Monitored Anesthesia Care Complications:         No immediate complications. Estimated blood loss: None. Procedure:             Pre-Anesthesia Assessment:                        - Prior to the procedure, a History and Physical was                         performed, and patient medications and allergies were                         reviewed. The patient is competent. The risks and                         benefits of the procedure and the sedation options and                         risks were discussed with the patient. All questions                         were answered and informed consent was obtained.                         Patient identification and proposed procedure were                         verified by the physician, the nurse, the                         anesthesiologist, the anesthetist and the technician                         in the pre-procedure area in the procedure room in the                         endoscopy suite. Mental Status Examination: alert and                         oriented. Airway Examination: normal oropharyngeal                         airway and neck mobility. Respiratory Examination:  clear to auscultation. CV Examination: normal.                         Prophylactic Antibiotics: The patient does not require                         prophylactic antibiotics. Prior  Anticoagulants: The                         patient has taken no previous anticoagulant or                         antiplatelet agents. ASA Grade Assessment: II - A                         patient with mild systemic disease. After reviewing                         the risks and benefits, the patient was deemed in                         satisfactory condition to undergo the procedure. The                         anesthesia plan was to use monitored anesthesia care                         (MAC). Immediately prior to administration of                         medications, the patient was re-assessed for adequacy                         to receive sedatives. The heart rate, respiratory                         rate, oxygen saturations, blood pressure, adequacy of                         pulmonary ventilation, and response to care were                         monitored throughout the procedure. The physical                         status of the patient was re-assessed after the                         procedure.                        After obtaining informed consent, the colonoscope was                         passed under direct vision. Throughout the procedure,                         the patient's blood pressure, pulse, and oxygen  saturations were monitored continuously. The                         Colonoscope was introduced through the anus and                         advanced to the the cecum, identified by appendiceal                         orifice and ileocecal valve. The colonoscopy was                         performed with moderate difficulty due to significant                         looping and the patient's body habitus. Successful                         completion of the procedure was aided by applying                         abdominal pressure. The patient tolerated the                         procedure well. The quality of the bowel preparation                          was evaluated using the BBPS [facility] Bowel Preparation                         Scale) with scores of: Right Colon = 3 (entire mucosa                         seen well with no residual staining, small fragments                         of stool or opaque liquid), Transverse Colon = 3                         (entire mucosa seen well with no residual staining,                         small fragments of stool or opaque liquid) and Left                         Colon = 2 (minor amount of residual staining, small                         fragments of stool and/or opaque liquid, but mucosa                         seen well). The total BBPS score equals 8. Findings:      The perianal and digital rectal examinations were normal. Pertinent       negatives include normal sphincter tone and no palpable rectal lesions.      Two sessile polyps were found in the transverse colon. The polyps were 3  to 7 mm in size. These polyps were removed with a cold snare. Resection       and retrieval were complete. Estimated blood loss was minimal.      A few diverticula were found in the sigmoid colon.      The retroflexed view of the distal rectum and anal verge was normal and       showed no anal or rectal abnormalities. Impression:            - Two 3 to 7 mm polyps in the transverse colon,                         removed with a cold snare. Resected and retrieved.                        - Diverticulosis in the sigmoid colon.                        - The distal rectum and anal verge are normal on                         retroflexion view. Recommendation:        - Discharge patient to home (with escort).                        - Resume previous diet today.                        - Continue present medications.                        - Await pathology results.                        - Repeat colonoscopy in 7 years for surveillance based                         on pathology results. Procedure  Code(s):     --- Professional ---                        [phone], Colonoscopy, flexible; with removal of                         tumor(s), polyp(s), or other lesion(s) by snare                         technique Diagnosis Code(s):     --- Professional ---                        Z86.010, Personal history of colonic polyps                        K63.5, Polyp of colon                        K57.30, Diverticulosis of large intestine without                         perforation or abscess without bleeding CPT copyright 2019 American Medical Association. All rights reserved. The  codes documented in this report are preliminary and upon coder review may  be revised to meet current compliance requirements. Dr. Ulyess Mort Lin Landsman MD, MD 04/17/2019 10:57:38 AM This report has been signed electronically. Number of Addenda: 0 Note Initiated On: 04/17/2019 10:23 AM Scope Withdrawal Time: 0 hours 12 minutes 51 seconds  Total Procedure Duration: 0 hours 19 minutes 51 seconds  Estimated Blood Loss:  Estimated blood loss: none.      [facility]

## 2019-04-17 NOTE — Anesthesia Preprocedure Evaluation (Addendum)
Anesthesia Evaluation  Patient identified by MRN, date of birth, ID band Patient awake    Reviewed: Allergy & Precautions, H&P , NPO status , Patient's Chart, lab work & pertinent test results, reviewed documented beta blocker date and time   Airway Mallampati: II  TM Distance: >3 FB Neck ROM: full    Dental no notable dental hx.    Pulmonary Current Smoker (40 pack years),    Pulmonary exam normal breath sounds clear to auscultation       Cardiovascular Exercise Tolerance: Good negative cardio ROS   Rhythm:regular Rate:Normal     Neuro/Psych negative neurological ROS  negative psych ROS   GI/Hepatic negative GI ROS, Neg liver ROS,   Endo/Other  negative endocrine ROS  Renal/GU Kidney stones  negative genitourinary   Musculoskeletal  (+) Arthritis ,   Abdominal   Peds  Hematology negative hematology ROS (+)   Anesthesia Other Findings   Reproductive/Obstetrics negative OB ROS                            Anesthesia Physical Anesthesia Plan  ASA: II  Anesthesia Plan: General   Post-op Pain Management:    Induction:   PONV Risk Score and Plan: 2 and Propofol infusion and TIVA  Airway Management Planned:   Additional Equipment:   Intra-op Plan:   Post-operative Plan:   Informed Consent: I have reviewed the patients History and Physical, chart, labs and discussed the procedure including the risks, benefits and alternatives for the proposed anesthesia with the patient or authorized representative who has indicated his/her understanding and acceptance.     Dental Advisory Given  Plan Discussed with: CRNA  Anesthesia Plan Comments:         Anesthesia Quick Evaluation

## 2019-04-18 ENCOUNTER — Encounter: Payer: Self-pay | Admitting: *Deleted

## 2019-04-19 ENCOUNTER — Encounter: Payer: Self-pay | Admitting: Gastroenterology

## 2019-04-19 LAB — SURGICAL PATHOLOGY

## 2019-07-17 ENCOUNTER — Ambulatory Visit (INDEPENDENT_AMBULATORY_CARE_PROVIDER_SITE_OTHER): Payer: 59 | Admitting: Internal Medicine

## 2019-07-17 ENCOUNTER — Encounter: Payer: Self-pay | Admitting: Internal Medicine

## 2019-07-17 ENCOUNTER — Other Ambulatory Visit: Payer: Self-pay

## 2019-07-17 VITALS — BP 118/78 | HR 80 | Temp 98.3°F | Resp 16 | Ht 69.0 in | Wt 193.3 lb

## 2019-07-17 DIAGNOSIS — R319 Hematuria, unspecified: Secondary | ICD-10-CM | POA: Diagnosis not present

## 2019-07-17 DIAGNOSIS — N3001 Acute cystitis with hematuria: Secondary | ICD-10-CM | POA: Diagnosis not present

## 2019-07-17 LAB — POCT URINALYSIS DIPSTICK
Bilirubin, UA: NEGATIVE
Glucose, UA: NEGATIVE
Ketones, UA: NEGATIVE
Leukocytes, UA: NEGATIVE
Nitrite, UA: NEGATIVE
Protein, UA: NEGATIVE
Spec Grav, UA: 1.01 (ref 1.010–1.025)
Urobilinogen, UA: 0.2 E.U./dL
pH, UA: 6 (ref 5.0–8.0)

## 2019-07-17 MED ORDER — CIPROFLOXACIN HCL 500 MG PO TABS: 500.0000 mg | ORAL_TABLET | Freq: Two times a day (BID) | ORAL | 1 refills | Status: AC

## 2019-07-17 NOTE — Progress Notes (Signed)
Patient ID: Sharon Wright, female    DOB: 08-14-1960, 59 y.o.   MRN: [id number]  PCP: Towanda Malkin, MD  Chief Complaint  Patient presents with  . Urinary Tract Infection    Patient suspects UTI  . Hematuria    Onset Friday 5/14  . Abdominal Cramping  . Urine odor    Subjective:   Sharon Wright is a 59 y.o. female, presents to clinic with CC of the following:  Chief Complaint  Patient presents with  . Urinary Tract Infection    Patient suspects UTI  . Hematuria    Onset Friday 5/14  . Abdominal Cramping  . Urine odor    HPI:  Patient is a 59 year old female Presents today with concerns for a urinary tract infection.  She notes that Friday, she felt a catch in her side, and then Saturday her urine was very cloudy.  Sunday she started to have more blood in the urine, with some suprapubic pressure feeling, and some pain that gets up into the left side/flank area.  She has been taking Tylenol products.  Denies any fevers although she did state she felt sweaty/cold in the morning, but no documented fever.  No marked nausea or vomiting, although did note feeling queasy at times. She has had prior bladder infections, questioned if a pyelonephritis at some point in the past, and she did note she has been on a Cipro product to treat in her past.  She had no problems taking that medicine.  She is a current every day smoker.    Patient Active Problem List   Diagnosis Date Noted  . Personal history of colonic polyps   . Obesity (BMI 30.0-34.9) 08/16/2017  . Tobacco abuse 03/22/2017      Current Outpatient Medications:  .  acetaminophen (TYLENOL 8 HOUR ARTHRITIS PAIN) 650 MG CR tablet, Take 650 mg by mouth every 8 (eight) hours as needed for pain., Disp: , Rfl:  .  cholecalciferol (VITAMIN D) 1000 UNITS tablet, Take 1,000 Units by mouth daily. , Disp: , Rfl:    Allergies  Allergen Reactions  . Oxycodone Nausea And Vomiting  . Penicillins Rash and Other  (See Comments)    Childhood allergy rash after 3 days. No SOB, no hospitilization Has patient had a PCN reaction causing immediate rash, facial/tongue/throat swelling, SOB or lightheadedness with hypotension: No Has patient had a PCN reaction causing severe rash involving mucus membranes or skin necrosis: No Has patient had a PCN reaction that required hospitalization: No Has patient had a PCN reaction occurring within the last 10 years: No If all of the above answers are "NO", then may proceed with Cephalos     Past Surgical History:  Procedure Laterality Date  . back syugery  1993   lower lumbar  . Benign tumor  1993  . BUNIONECTOMY Left 2006  . COLONOSCOPY WITH PROPOFOL N/A 04/17/2019   Procedure: COLONOSCOPY WITH PROPOFOL;  Surgeon: Lin Landsman, MD;  Location: [facility];  Service: Gastroenterology;  Laterality: N/A;  . CYSTOSCOPY/URETEROSCOPY/HOLMIUM LASER/STENT PLACEMENT Right 08/11/2018   Procedure: CYSTOSCOPY/URETEROSCOPY/HOLMIUM LASER/STENT PLACEMENT;  Surgeon: Billey Co, MD;  Location: [facility];  Service: Urology;  Laterality: Right;  . LITHOTRIPSY    . POLYPECTOMY  04/17/2019   Procedure: POLYPECTOMY;  Surgeon: Lin Landsman, MD;  Location: [facility];  Service: Gastroenterology;;  . ROBOTIC ASSISTED BILATERAL SALPINGO OOPHERECTOMY Bilateral 02/06/2018   Procedure: XI ROBOTIC ASSISTED BILATERAL SALPINGO OOPHORECTOMY , PELVIC WASHINGS;  Surgeon: Isabel Caprice, MD;  Location: [facility];  Service: Gynecology;  Laterality: Bilateral;  . 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 Congestive Heart Failure Mother   . Heart disease Mother   . Uterine cancer Mother   . Diabetes Father   . Heart disease Father   . Dementia Father   . Heart disease Sister   . Diabetes Sister      Social History   Tobacco Use  . Smoking status: Current Every Day Smoker     Packs/day: 1.00    Years: 40.00    Pack years: 40.00    Types: Cigarettes  . Smokeless tobacco: Never Used  . Tobacco comment: since age 54  Substance Use Topics  . Alcohol use: No    With staff assistance, above reviewed with the patient today.  ROS: As per HPI, otherwise no specific complaints on a limited and focused system review   Results for orders placed or performed in visit on 07/17/19 (from the past 72 hour(s))  POCT Urinalysis Dipstick     Status: Abnormal   Collection Time: 07/17/19  2:55 PM  Result Value Ref Range   Color, UA yellow    Clarity, UA clear    Glucose, UA Negative Negative   Bilirubin, UA negative    Ketones, UA negative    Spec Grav, UA 1.010 1.010 - 1.025   Blood, UA Large    pH, UA 6.0 5.0 - 8.0   Protein, UA Negative Negative   Urobilinogen, UA 0.2 0.2 or 1.0 E.U./dL   Nitrite, UA negative    Leukocytes, UA Negative Negative   Appearance clear    Odor Strong      PHQ2/9: Depression screen [facility] 2/9 07/17/2019 08/22/2018 05/01/2018 04/26/2018 04/14/2018  Decreased Interest 0 0 0 0 0  Down, Depressed, Hopeless 0 0 0 0 0  PHQ - 2 Score 0 0 0 0 0  Altered sleeping 0 0 0 0 0  Tired, decreased energy 3 0 0 0 0  Change in appetite 0 0 0 0 0  Feeling bad or failure about yourself  - 0 0 0 0  Trouble concentrating 0 0 0 0 0  Moving slowly or fidgety/restless 0 0 0 0 0  Suicidal thoughts 0 0 0 0 0  PHQ-9 Score 3 0 0 0 0  Difficult doing work/chores Not difficult at all Not difficult at all Not difficult at all Not difficult at all Not difficult at all   PHQ-2/9 Result is neg  Fall Risk: Fall Risk  07/17/2019 08/22/2018 05/01/2018 04/26/2018 04/14/2018  Falls in the past year? 0 0 0 0 0  Number falls in past yr: 0 0 0 0 0  Injury with Fall? 0 0 0 0 0  Follow up - - - - Falls evaluation completed      Objective:   Vitals:   07/17/19 1445  BP: 118/78  Pulse: 80  Resp: 16  Temp: 98.3 F (36.8 C)  TempSrc: Temporal  SpO2: 98%  Weight: 193 lb 4.8  oz (87.7 kg)  Height: 5\' 9"  (1.753 m)    Body mass index is 28.55 kg/m.  Physical Exam   NAD, masked HEENT - [place]/AT, sclera anicteric, conj - non-inj'ed, pharynx clear Neck - supple, no adenopathy, no rigidity Car - RRR without m/g/r, not tachycardic Pulm- RR and effort normal at rest,  CTA without wheeze or rales Abd - soft, NT, ND, BS+,  no masses, not markedly tender to palpation where she feels the pressure in the suprapubic region and slightly towards the left  Back -mild left-sided CVA tenderness with palpation, no right-sided CVA tenderness Ext - no LE edema,  Neuro/psychiatric - affect was not flat, appropriate with conversation  Speech normal   Results for orders placed or performed in visit on 07/17/19  POCT Urinalysis Dipstick  Result Value Ref Range   Color, UA yellow    Clarity, UA clear    Glucose, UA Negative Negative   Bilirubin, UA negative    Ketones, UA negative    Spec Grav, UA 1.010 1.010 - 1.025   Blood, UA Large    pH, UA 6.0 5.0 - 8.0   Protein, UA Negative Negative   Urobilinogen, UA 0.2 0.2 or 1.0 E.U./dL   Nitrite, UA negative    Leukocytes, UA Negative Negative   Appearance clear    Odor Strong        Assessment & Plan:    1. Hematuria, unspecified type 2. Acute cystitis with hematuria/concern for possible early pyelonephritis Educated patient, and will send the urine for a complete urinalysis and culture, and do feel treating empirically is important presently as we await those results.  Discussed options of medicines including a Bactrim type product, Macrobid, or Cipro, with the many potential side effect concerns of the Cipro noted.  If there are concerns for a pyelonephritis component, Cipro is often the preferred antibiotic, and noting she has been on it before without any problems previously, felt best to prescribe that today. Noted if the culture returns resistant to this, she will be notified, and also, if she is not improving over the  next 2 to 3 days she needs to follow-up as well. Emphasized staying well-hydrated as well - Urinalysis, Complete - Urine Culture - POCT Urinalysis Dipstick - ciprofloxacin (CIPRO) 500 MG tablet; Take 1 tablet (500 mg total) by mouth 2 (two) times daily for 5 days.  Dispense: 10 tablet; Refill: 1   To follow-up if not improving or more problematic and await results presently       Towanda Malkin, MD 07/17/19 3:04 PM

## 2019-07-17 NOTE — Patient Instructions (Signed)
Please take antibiotic, ciprofloxacin, twice daily for 5 days  Stay well-hydrated.

## 2019-07-19 LAB — URINALYSIS, COMPLETE
Bacteria, UA: NONE SEEN /HPF
Bilirubin Urine: NEGATIVE
Glucose, UA: NEGATIVE
Hyaline Cast: NONE SEEN /LPF
Ketones, ur: NEGATIVE
Leukocytes,Ua: NEGATIVE
Nitrite: NEGATIVE
Protein, ur: NEGATIVE
Specific Gravity, Urine: 1.017 (ref 1.001–1.03)
Squamous Epithelial / HPF: NONE SEEN /HPF (ref <=5)
WBC, UA: NONE SEEN /HPF (ref 0–5)
pH: 5.5 (ref 5.0–8.0)

## 2019-07-19 LAB — URINE CULTURE
MICRO NUMBER:: 10491381
SPECIMEN QUALITY:: ADEQUATE

## 2019-08-27 ENCOUNTER — Other Ambulatory Visit: Payer: Self-pay

## 2019-08-27 ENCOUNTER — Encounter: Payer: Self-pay | Admitting: Family Medicine

## 2019-08-27 ENCOUNTER — Telehealth (INDEPENDENT_AMBULATORY_CARE_PROVIDER_SITE_OTHER): Payer: 59 | Admitting: Family Medicine

## 2019-08-27 VITALS — Ht 69.0 in | Wt 185.0 lb

## 2019-08-27 DIAGNOSIS — N3090 Cystitis, unspecified without hematuria: Secondary | ICD-10-CM

## 2019-08-27 DIAGNOSIS — J069 Acute upper respiratory infection, unspecified: Secondary | ICD-10-CM

## 2019-08-27 DIAGNOSIS — N390 Urinary tract infection, site not specified: Secondary | ICD-10-CM

## 2019-08-27 MED ORDER — NITROFURANTOIN MONOHYD MACRO 100 MG PO CAPS: 100.0000 mg | ORAL_CAPSULE | Freq: Two times a day (BID) | ORAL | 0 refills | Status: AC

## 2019-08-27 NOTE — Progress Notes (Signed)
Name: Sharon Wright   MRN: [id number]    DOB: December 18, 1960   Date:08/27/2019       Progress Note  Subjective:    Chief Complaint  Chief Complaint  Patient presents with  . Ear Pain    with sore throat, onset 3 days   . Urinary Tract Infection    onset last night, back pain, foul smelling urine and cloudy  . Arm Pain    onset left, 3 days    I connected with  [name] July  on 08/27/19 at  9:20 AM EDT by a video enabled telemedicine application and verified that I am speaking with the correct person using two identifiers.  I discussed the limitations of evaluation and management by telemedicine and the availability of in person appointments. The patient expressed understanding and agreed to proceed. Staff also discussed with the patient that there may be a patient responsible charge related to this service. Patient Location: home Provider Location: [facility] Additional Individuals present: none  HPI Pt presents virtually with multiple complaints, encouraged to pick her most concerning acute issue today.  Advised that arm pain may be best evaluated in person.  She did recently see her PCP here with urinary complaints, was treated with cipro, had urinary sx again begin last night, with pain to bilateral flank, onset yesterday, constant, urine was cloudy and malodorous.    Shoulder pain - hurts lowering it, has been hurting x 6 weeks but is getting worse   Patient Active Problem List   Diagnosis Date Noted  . Personal history of colonic polyps   . Obesity (BMI 30.0-34.9) 08/16/2017  . Tobacco abuse 03/22/2017    Social History   Tobacco Use  . Smoking status: Current Every Day Smoker    Packs/day: 1.00    Years: 40.00    Pack years: 40.00    Types: Cigarettes  . Smokeless tobacco: Never Used  . Tobacco comment: since age 33  Substance Use Topics  . Alcohol use: No     Current Outpatient Medications:  .  acetaminophen (TYLENOL 8 HOUR ARTHRITIS PAIN) 650 MG CR  tablet, Take 650 mg by mouth every 8 (eight) hours as needed for pain., Disp: , Rfl:  .  cholecalciferol (VITAMIN D) 1000 UNITS tablet, Take 1,000 Units by mouth daily. , Disp: , Rfl:   Allergies  Allergen Reactions  . Oxycodone Nausea And Vomiting  . Penicillins Rash and Other (See Comments)    Childhood allergy rash after 3 days. No SOB, no hospitilization Has patient had a PCN reaction causing immediate rash, facial/tongue/throat swelling, SOB or lightheadedness with hypotension: No Has patient had a PCN reaction causing severe rash involving mucus membranes or skin necrosis: No Has patient had a PCN reaction that required hospitalization: No Has patient had a PCN reaction occurring within the last 10 years: No If all of the above answers are "NO", then may proceed with Cephalos    I personally reviewed active problem list, medication list, allergies, family history, social history, health maintenance, notes from last encounter, lab results, imaging with the patient/caregiver today.   Review of Systems  10 Systems reviewed and are negative for acute change except as noted in the HPI.   Objective:   Virtual encounter, vitals limited, only able to obtain the following Today's Vitals   08/27/19 0924  Weight: 185 lb (83.9 kg)  Height: 5\' 9"  (1.753 m)   Body mass index is 27.32 kg/m. Nursing Note and Vital Signs  reviewed.  Physical Exam Vitals and nursing note reviewed.  Constitutional:      General: She is not in acute distress.    Appearance: Normal appearance. She is well-developed. She is not ill-appearing, toxic-appearing or diaphoretic.  HENT:     Head: Normocephalic and atraumatic.  Eyes:     General:        Right eye: No discharge.        Left eye: No discharge.     Conjunctiva/sclera: Conjunctivae normal.  Neck:     Trachea: No tracheal deviation.  Cardiovascular:     Rate and Rhythm: Normal rate.  Pulmonary:     Effort: Pulmonary effort is normal. No  respiratory distress.     Breath sounds: No stridor.  Skin:    Coloration: Skin is not jaundiced or pale.     Findings: No rash.  Neurological:     Mental Status: She is alert.  Psychiatric:        Mood and Affect: Mood normal.        Behavior: Behavior normal.     PE limited by telephone encounter  No results found for this or any previous visit (from the past 72 hour(s)).  Assessment and Plan:     ICD-10-CM   1. Cystitis  N30.90 Urine Culture    Urinalysis, Routine w reflex microscopic    nitrofurantoin, macrocrystal-monohydrate, (MACROBID) 100 MG capsule    MICROSCOPIC MESSAGE  2. Recurrent UTI  N39.0 Urine Culture    Urinalysis, Routine w reflex microscopic    nitrofurantoin, macrocrystal-monohydrate, (MACROBID) 100 MG capsule    MICROSCOPIC MESSAGE  3. Upper respiratory tract infection, unspecified type  J06.9    recent sick contact (neg COVID testing) has sore throat, ear pain with some swollen lymph nodes, likely viral, tx supportive and sx     She will come by to give urine sample- will start tx based off last micro - positive for Enterococcus faecalis, which was tx with cipro - sensitivity shows nitrofurantoin, ampicillin and vanc effective - penicillin allergy, will have pt start tx tonight with nitrofurantoin.  Close f/up - possible retesting to see if infection clears? Since last OV with urinary sx was a little over a month ago.  Explained viral illness with likely run its course including nasal sx, sore throat, ear sx can tx with antihistamines, decongestants, steroid nasal sprays  Pt encouraged to get an additional appt when well to be assessed for her shoulder/arm pain    -Red flags and when to present for emergency care or RTC including fever >101.6F, chest pain, shortness of breath, new/worsening/un-resolving symptoms, reviewed with patient at time of visit. Follow up and care instructions discussed and provided in AVS. - I discussed the assessment and  treatment plan with the patient. The patient was provided an opportunity to ask questions and all were answered. The patient agreed with the plan and demonstrated an understanding of the instructions.  I provided 30+  minutes of non-face-to-face time during this encounter. Virtual encounter and then later when pt came to give sample to review results and call and discuss results and tx with pt  Delsa Grana, PA-C 08/27/19 9:45 AM

## 2019-08-28 ENCOUNTER — Telehealth: Payer: Self-pay | Admitting: Internal Medicine

## 2019-08-28 ENCOUNTER — Ambulatory Visit: Payer: Self-pay | Admitting: *Deleted

## 2019-08-28 LAB — URINALYSIS, ROUTINE W REFLEX MICROSCOPIC
Bacteria, UA: NONE SEEN /HPF
Bilirubin Urine: NEGATIVE
Crystals: NONE SEEN /HPF
Glucose, UA: NEGATIVE
Hyaline Cast: NONE SEEN /LPF
Ketones, ur: NEGATIVE
Leukocytes,Ua: NEGATIVE
Nitrite: NEGATIVE
Protein, ur: NEGATIVE
RBC / HPF: NONE SEEN /HPF (ref 0–2)
Specific Gravity, Urine: 1.034 (ref 1.001–1.03)
pH: <=5 (ref 5.0–8.0)

## 2019-08-28 LAB — URINE CULTURE
MICRO NUMBER:: 10642458
SPECIMEN QUALITY:: ADEQUATE

## 2019-08-28 NOTE — Telephone Encounter (Signed)
Pt.notified

## 2019-08-28 NOTE — Telephone Encounter (Signed)
Tried to call pt mailbox full, was sent in antibiotic yesterday

## 2019-08-28 NOTE — Telephone Encounter (Signed)
Patient called in stating she needs to be prescribed something for her ear ache, she stated she had a virtual appt yesterday and wasn't able to sleep last night due to pain, her voicemail isnt set up so she said a text message with a a update would be ok or a mychart message.please advise  Preferred Pharmacy: [facility]

## 2019-09-30 NOTE — Progress Notes (Signed)
Patient ID: DRUCILLA CUMBER, female    DOB: 05-Apr-1960, 59 y.o.   MRN: [id number]  PCP: Towanda Malkin, MD  Chief Complaint  Patient presents with  . Arm Pain    pain primarily in left upper arm, started more than 8 weeks ago, describes it as "on fire" can not get comfortable, hard to sleep  . Neck Pain    Subjective:   IDABELL PICKING is a 59 y.o. female, presents to clinic with CC of the following:  Chief Complaint  Patient presents with  . Arm Pain    pain primarily in left upper arm, started more than 8 weeks ago, describes it as "on fire" can not get comfortable, hard to sleep  . Neck Pain    HPI:  Patient is a 59 year old female Last visit with me was in May 2021 with concerns for a UTI. She followed up again with Delsa Grana on 08/27/2019 with concerns for a recurrent UTI. She follows up today with complaints of neck/shoulder/arm pain.  She notes she initially had pain in her left shoulder, with it extending down her left arm more in the proximal portion, describing the pain as a burning type pain.  This started approximately 8 weeks ago.  Over time, she has had more left-sided neck pains, to the point of being unable to sleep in the past week, with pain felt from the neck towards her shoulder, lesser so in the left upper back area. She notes her grip is not as strong as it used to be, although no dropping things recently, occasionally feels some numbness down the arm as well. She has tried arthritis Tylenol, IcyHot patches, and cold at times, and not much relief. She denies any left-sided chest pains, no palpitations or shortness of breath.  (As she noted it is not her heart, as if it was she would be dead by now).  She noted she had ruptured disks in her lumbar region, L4/5 in the past and had surgery. She is really hoping to get something to help her sleep at night, as the pain is making sleep very problematic for her.  Tobacco-current every day  smoker  Patient Active Problem List   Diagnosis Date Noted  . Personal history of colonic polyps   . Obesity (BMI 30.0-34.9) 08/16/2017  . Tobacco abuse 03/22/2017      Current Outpatient Medications:  .  acetaminophen (TYLENOL 8 HOUR ARTHRITIS PAIN) 650 MG CR tablet, Take 650 mg by mouth every 8 (eight) hours as needed for pain., Disp: , Rfl:  .  cholecalciferol (VITAMIN D) 1000 UNITS tablet, Take 1,000 Units by mouth daily. , Disp: , Rfl:    Allergies  Allergen Reactions  . Oxycodone Nausea And Vomiting  . Penicillins Rash and Other (See Comments)    Childhood allergy rash after 3 days. No SOB, no hospitilization Has patient had a PCN reaction causing immediate rash, facial/tongue/throat swelling, SOB or lightheadedness with hypotension: No Has patient had a PCN reaction causing severe rash involving mucus membranes or skin necrosis: No Has patient had a PCN reaction that required hospitalization: No Has patient had a PCN reaction occurring within the last 10 years: No If all of the above answers are "NO", then may proceed with Cephalos     Past Surgical History:  Procedure Laterality Date  . back syugery  1993   lower lumbar  . Benign tumor  1993  . BUNIONECTOMY Left 2006  . COLONOSCOPY WITH PROPOFOL  N/A 04/17/2019   Procedure: COLONOSCOPY WITH PROPOFOL;  Surgeon: Lin Landsman, MD;  Location: [place];  Service: Gastroenterology;  Laterality: N/A;  . CYSTOSCOPY/URETEROSCOPY/HOLMIUM LASER/STENT PLACEMENT Right 08/11/2018   Procedure: CYSTOSCOPY/URETEROSCOPY/HOLMIUM LASER/STENT PLACEMENT;  Surgeon: Billey Co, MD;  Location: [facility];  Service: Urology;  Laterality: Right;  . LITHOTRIPSY    . POLYPECTOMY  04/17/2019   Procedure: POLYPECTOMY;  Surgeon: Lin Landsman, MD;  Location: [place];  Service: Gastroenterology;;  . ROBOTIC ASSISTED BILATERAL SALPINGO OOPHERECTOMY Bilateral 02/06/2018   Procedure: XI ROBOTIC ASSISTED BILATERAL  SALPINGO OOPHORECTOMY , PELVIC WASHINGS;  Surgeon: Isabel Caprice, MD;  Location: [place];  Service: Gynecology;  Laterality: Bilateral;  . 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 Congestive Heart Failure Mother   . Heart disease Mother   . Uterine cancer Mother   . Diabetes Father   . Heart disease Father   . Dementia Father   . Heart disease Sister   . Diabetes Sister      Social History   Tobacco Use  . Smoking status: Current Every Day Smoker    Packs/day: 1.00    Years: 40.00    Pack years: 40.00    Types: Cigarettes  . Smokeless tobacco: Never Used  . Tobacco comment: since age 74  Substance Use Topics  . Alcohol use: No    With staff assistance, above reviewed with the patient today.  ROS: As per HPI, otherwise no specific complaints on a limited and focused system review   No results found for this or any previous visit (from the past 72 hour(s)).   PHQ2/9: Depression screen [facility] 2/9 10/01/2019 08/27/2019 07/17/2019 08/22/2018 05/01/2018  Decreased Interest 0 0 0 0 0  Down, Depressed, Hopeless 0 0 0 0 0  PHQ - 2 Score 0 0 0 0 0  Altered sleeping 1 0 0 0 0  Tired, decreased energy 1 0 3 0 0  Change in appetite 0 0 0 0 0  Feeling bad or failure about yourself  0 0 - 0 0  Trouble concentrating 0 0 0 0 0  Moving slowly or fidgety/restless 0 0 0 0 0  Suicidal thoughts 0 0 0 0 0  PHQ-9 Score 2 0 3 0 0  Difficult doing work/chores Not difficult at all Not difficult at all Not difficult at all Not difficult at all Not difficult at all   PHQ-2/9 Result is neg  Fall Risk: Fall Risk  10/01/2019 08/27/2019 07/17/2019 08/22/2018 05/01/2018  Falls in the past year? 0 0 0 0 0  Number falls in past yr: 0 0 0 0 0  Injury with Fall? 0 0 0 0 0  Follow up - - - - -      Objective:   Vitals:   10/01/19 1351  BP: 122/80  Pulse: 84  Resp: 16  Temp: 98.3 F (36.8 C)  TempSrc: Temporal   SpO2: 100%  Weight: 194 lb 4.8 oz (88.1 kg)  Height: 5\' 9"  (1.753 m)    Body mass index is 28.69 kg/m.  Physical Exam   NAD, masked HEENT - [place]/AT, sclera anicteric, PERRL, EOMI, conj - non-inj'ed, pharynx clear Neck - supple, nontender with palpation down the cervical spine, with more tender left of the spine proper and down towards the trap muscle complex and the shoulder laterally.  Hesitant with range  of motion of her neck due to the discomfort, nontender more anteriorly in the sternocleidomastoid region. Car - RRR without m/g/r Pulm- RR and effort normal at rest, CTA without wheeze or rales Back -nontender down the thoracic spine, not markedly tender to palpate in the infrascapular region, although sometimes he feels some discomfort there,  Skin- no rash noted,  Ext -nontender to palpate the left proximal upper extremity where she feels the discomfort, mostly from the deltoid down to the elbow region.  No bruising or swelling present.  She could do the Apley's from above and below, also had good motion of the shoulder with abduction and forward elevation.  Sensation intact to light touch down the left upper extremity with good strength, including good grip strength.  Pulses intact distally. Neuro/psychiatric - affect was not flat, appropriate with conversation  Alert   Speech normal   Results for orders placed or performed in visit on 08/27/19  Urine Culture   Specimen: Urine  Result Value Ref Range   MICRO NUMBER: [id number]    SPECIMEN QUALITY: Adequate    Sample Source URINE    STATUS: FINAL    ISOLATE 1:      Less than 10,000 CFU/mL of single Gram positive organism isolated. No further testing will be performed. If clinically indicated, recollection using a method to minimize contamination, with prompt transfer to Urine Culture Transport Tube, is recommended.  Urinalysis, Routine w reflex microscopic  Result Value Ref Range   Color, Urine YELLOW YELLOW   APPearance CLEAR  CLEAR   Specific Gravity, Urine 1.034 1.001 - 1.03   pH < OR = 5.0 5.0 - 8.0   Glucose, UA NEGATIVE NEGATIVE   Bilirubin Urine NEGATIVE NEGATIVE   Ketones, ur NEGATIVE NEGATIVE   Hgb urine dipstick 2+ (A) NEGATIVE   Protein, ur NEGATIVE NEGATIVE   Nitrite NEGATIVE NEGATIVE   Leukocytes,Ua NEGATIVE NEGATIVE   RBC / HPF NONE SEEN 0 - 2 /HPF   Squamous Epithelial / LPF 0-5 < OR = 5 /HPF   Bacteria, UA NONE SEEN NONE SEEN /HPF   Calcium Oxalate Crystal MODERATE (A) NONE OR FE /HPF   Crystals NONE SEEN NONE SEEN  /HPF   Hyaline Cast NONE SEEN NONE SEEN /LPF       Assessment & Plan:    .1. Neck pain 2. Radicular pain in left arm Noting the left-sided neck pain and radicular symptoms down the left upper extremity described as burning, concern for nerve pain and concern for possible disc issues in the cervical spine noted. Felt best to get an x-ray presently and ordered Also will add a Medrol Dosepak Flexeril-10 mg at bedtime as needed, and do think this will help her sleep at night, warned not to take during the day as may make her drowsy. Can continue with cold topically to the neck area, There is aware the MRI is better and looking for disc issues, although recommended to start with the x-ray initially, and await that result. If symptoms are not improving or more problematic despite the above, discussed neck steps including referral to the spine surgeons to help with an MRI and further assessment.  She was understanding of that. - methylPREDNISolone (MEDROL DOSEPAK) 4 MG TBPK tablet; Take 6 tabs on day one, and decrease by one tab daily until finished as directed  Dispense: 1 each; Refill: 0 - cyclobenzaprine (FLEXERIL) 10 MG tablet; Take 1 tablet (10 mg total) by mouth at bedtime. Take as needed  Dispense: 20  tablet; Refill: 1 - DG Cervical Spine Complete; Future  3. H/O degenerative disc disease Has a known history of disc disease in her lumbar region in the past. - DG Cervical  Spine Complete; Future'   Await x-ray result, response to the above, and if not improving or more problematic, or if it is and then recurs quickly after the steroid wean, she will follow-up.    Towanda Malkin, MD 10/01/19 2:06 PM

## 2019-10-01 ENCOUNTER — Ambulatory Visit
Admission: RE | Admit: 2019-10-01 | Discharge: 2019-10-01 | Disposition: A | Discharge status: Home / Self Care | Payer: 59 | Source: Ambulatory Visit | Attending: Internal Medicine | Admitting: Internal Medicine

## 2019-10-01 ENCOUNTER — Other Ambulatory Visit: Payer: Self-pay

## 2019-10-01 ENCOUNTER — Encounter: Payer: Self-pay | Admitting: Internal Medicine

## 2019-10-01 ENCOUNTER — Ambulatory Visit
Admission: RE | Admit: 2019-10-01 | Discharge: 2019-10-01 | Disposition: A | Discharge status: Home / Self Care | Payer: 59 | Attending: Internal Medicine | Admitting: Internal Medicine

## 2019-10-01 ENCOUNTER — Ambulatory Visit (INDEPENDENT_AMBULATORY_CARE_PROVIDER_SITE_OTHER): Payer: 59 | Admitting: Internal Medicine

## 2019-10-01 VITALS — BP 122/80 | HR 84 | Temp 98.3°F | Resp 16 | Ht 69.0 in | Wt 194.3 lb

## 2019-10-01 DIAGNOSIS — M542 Cervicalgia: Secondary | ICD-10-CM

## 2019-10-01 DIAGNOSIS — Z8739 Personal history of other diseases of the musculoskeletal system and connective tissue: Secondary | ICD-10-CM

## 2019-10-01 DIAGNOSIS — M792 Neuralgia and neuritis, unspecified: Secondary | ICD-10-CM | POA: Insufficient documentation

## 2019-10-01 MED ORDER — METHYLPREDNISOLONE 4 MG PO TBPK: ORAL_TABLET | ORAL | 0 refills | Status: DC

## 2019-10-01 MED ORDER — CYCLOBENZAPRINE HCL 10 MG PO TABS: 10.0000 mg | ORAL_TABLET | Freq: Every day | ORAL | 1 refills | Status: DC

## 2019-10-01 NOTE — Patient Instructions (Signed)
Please obtain the x-ray today.

## 2020-01-30 ENCOUNTER — Other Ambulatory Visit: Payer: Self-pay

## 2020-01-30 ENCOUNTER — Ambulatory Visit (INDEPENDENT_AMBULATORY_CARE_PROVIDER_SITE_OTHER): Payer: 59 | Admitting: Internal Medicine

## 2020-01-30 ENCOUNTER — Encounter: Payer: Self-pay | Admitting: Internal Medicine

## 2020-01-30 VITALS — BP 130/90 | HR 86 | Temp 97.6°F | Resp 16 | Ht 69.0 in | Wt 209.8 lb

## 2020-01-30 DIAGNOSIS — N3001 Acute cystitis with hematuria: Secondary | ICD-10-CM

## 2020-01-30 DIAGNOSIS — Z87442 Personal history of urinary calculi: Secondary | ICD-10-CM | POA: Diagnosis not present

## 2020-01-30 DIAGNOSIS — N399 Disorder of urinary system, unspecified: Secondary | ICD-10-CM

## 2020-01-30 DIAGNOSIS — R109 Unspecified abdominal pain: Secondary | ICD-10-CM | POA: Diagnosis not present

## 2020-01-30 LAB — POCT URINALYSIS DIPSTICK
Appearance: ABNORMAL
Bilirubin, UA: NEGATIVE
Blood, UA: POSITIVE
Glucose, UA: NEGATIVE
Ketones, UA: NEGATIVE
Leukocytes, UA: NEGATIVE
Nitrite, UA: NEGATIVE
Odor: ABNORMAL
Protein, UA: NEGATIVE
Spec Grav, UA: 1.025 (ref 1.010–1.025)
Urobilinogen, UA: 0.2 E.U./dL
pH, UA: 6 (ref 5.0–8.0)

## 2020-01-30 MED ORDER — NITROFURANTOIN MONOHYD MACRO 100 MG PO CAPS: 100.0000 mg | ORAL_CAPSULE | Freq: Two times a day (BID) | ORAL | 1 refills | Status: AC

## 2020-01-30 NOTE — Patient Instructions (Signed)
Please pick up the antibiotic prescribed to your pharmacy and start taking today.  Stay well-hydrated.  Can use Tylenol products as needed for discomfort.  An ultrasound of the kidneys was ordered to help assess  Also, a referral was placed to urology noting your persistent flank pains and the kidney stone history with a prior stent placed noted, in addition to more frequent UTIs since that occurred.

## 2020-01-30 NOTE — Progress Notes (Signed)
Patient ID: Sharon Wright, female    DOB: 10-12-1960, 59 y.o.   MRN: [id number]  PCP: Towanda Malkin, MD  Chief Complaint  Patient presents with  . Flank Pain    Subjective:   Sharon Wright is a 59 y.o. female, presents to clinic with CC of the following:  Chief Complaint  Patient presents with  . Flank Pain    HPI:  Patient is a 59 year old female Returns today with flank pain.  She noted that on Friday, she started to notice some increasing left flank pains, and when asked, did note her urine had a stronger odor with some burning with urination.  She was concerned she may be having a kidney stone attack again.  She did have a kidney stone in June 2020, states the stone was busted and had a stent placed, and this was done with [facility] urology.  This was on her right side.  She notes that since that stent was placed, she had some persistence of discomfort in that right flank region, but also now more persistence of discomfort in the left flank region has been occurring.  She rested over the weekend, and notes her symptoms yesterday were problematic again with increasing flank pains, and also the persistence of discomfort with urination.  Today she feels a little better.  She states the pain is not nearly as severe as when she had the stone previously. Has had no fevers.  No marked nausea or vomiting.  She has tried taking arthritic strength Tylenol to help with the discomfort. Reviewing her chart, I saw her previously for UTI concerns with Enterococcus obtained on the urine culture.  That was in May.  She was seen again in June by my colleague for cystitis concerns.  That urine culture did not grow an organism.  She noted today she has never quite felt like she does not have the beginnings of a urinary tract infection when asked about symptoms, as she seems to have them a little more frequently with some occasional frequency and discomfort with urination, although at  a much lower level. She has not followed up with urology since as she stated she was told she needs a referral.  Patient Active Problem List   Diagnosis Date Noted  . Neck pain 10/01/2019  . Radicular pain in left arm 10/01/2019  . Personal history of colonic polyps   . Obesity (BMI 30.0-34.9) 08/16/2017  . Tobacco abuse 03/22/2017      Current Outpatient Medications:  .  acetaminophen (TYLENOL 8 HOUR ARTHRITIS PAIN) 650 MG CR tablet, Take 650 mg by mouth every 8 (eight) hours as needed for pain., Disp: , Rfl:  .  cholecalciferol (VITAMIN D) 1000 UNITS tablet, Take 1,000 Units by mouth daily. , Disp: , Rfl:    Allergies  Allergen Reactions  . Oxycodone Nausea And Vomiting  . Penicillins Rash and Other (See Comments)    Childhood allergy rash after 3 days. No SOB, no hospitilization Has patient had a PCN reaction causing immediate rash, facial/tongue/throat swelling, SOB or lightheadedness with hypotension: No Has patient had a PCN reaction causing severe rash involving mucus membranes or skin necrosis: No Has patient had a PCN reaction that required hospitalization: No Has patient had a PCN reaction occurring within the last 10 years: No If all of the above answers are "NO", then may proceed with Cephalos     Past Surgical History:  Procedure Laterality Date  . back syugery  1993  lower lumbar  . Benign tumor  1993  . BUNIONECTOMY Left 2006  . COLONOSCOPY WITH PROPOFOL N/A 04/17/2019   Procedure: COLONOSCOPY WITH PROPOFOL;  Surgeon: Lin Landsman, MD;  Location: [place];  Service: Gastroenterology;  Laterality: N/A;  . CYSTOSCOPY/URETEROSCOPY/HOLMIUM LASER/STENT PLACEMENT Right 08/11/2018   Procedure: CYSTOSCOPY/URETEROSCOPY/HOLMIUM LASER/STENT PLACEMENT;  Surgeon: Billey Co, MD;  Location: [facility];  Service: Urology;  Laterality: Right;  . LITHOTRIPSY    . POLYPECTOMY  04/17/2019   Procedure: POLYPECTOMY;  Surgeon: Lin Landsman, MD;   Location: [place];  Service: Gastroenterology;;  . ROBOTIC ASSISTED BILATERAL SALPINGO OOPHERECTOMY Bilateral 02/06/2018   Procedure: XI ROBOTIC ASSISTED BILATERAL SALPINGO OOPHORECTOMY , PELVIC WASHINGS;  Surgeon: Isabel Caprice, MD;  Location: [place];  Service: Gynecology;  Laterality: Bilateral;  . 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 Congestive Heart Failure Mother   . Heart disease Mother   . Uterine cancer Mother   . Diabetes Father   . Heart disease Father   . Dementia Father   . Heart disease Sister   . Diabetes Sister      Social History   Tobacco Use  . Smoking status: Current Every Day Smoker    Packs/day: 1.00    Years: 40.00    Pack years: 40.00    Types: Cigarettes  . Smokeless tobacco: Never Used  . Tobacco comment: since age 2  Substance Use Topics  . Alcohol use: No    With staff assistance, above reviewed with the patient today.  ROS: As per HPI, otherwise no specific complaints on a limited and focused system review   Results for orders placed or performed in visit on 01/30/20 (from the past 72 hour(s))  POCT Urinalysis Dipstick     Status: Abnormal   Collection Time: 01/30/20  3:18 PM  Result Value Ref Range   Color, UA Golden    Clarity, UA Clear    Glucose, UA Negative Negative   Bilirubin, UA Negative    Ketones, UA Negative    Spec Grav, UA 1.025 1.010 - 1.025   Blood, UA Positive     Comment: Large   pH, UA 6.0 5.0 - 8.0   Protein, UA Negative Negative   Urobilinogen, UA 0.2 0.2 or 1.0 E.U./dL   Nitrite, UA Negative    Leukocytes, UA Negative Negative   Appearance Abnormal    Odor Abnormal      PHQ2/9: Depression screen [facility] 2/9 01/30/2020 10/01/2019 08/27/2019 07/17/2019 08/22/2018  Decreased Interest 0 0 0 0 0  Down, Depressed, Hopeless 0 0 0 0 0  PHQ - 2 Score 0 0 0 0 0  Altered sleeping - 1 0 0 0  Tired, decreased energy - 1 0 3  0  Change in appetite - 0 0 0 0  Feeling bad or failure about yourself  - 0 0 - 0  Trouble concentrating - 0 0 0 0  Moving slowly or fidgety/restless - 0 0 0 0  Suicidal thoughts - 0 0 0 0  PHQ-9 Score - 2 0 3 0  Difficult doing work/chores - Not difficult at all Not difficult at all Not difficult at all Not difficult at all   PHQ-2/9 Result is neg  Fall Risk: Fall Risk  01/30/2020 10/01/2019 08/27/2019 07/17/2019 08/22/2018  Falls in the past year? 0 0 0 0 0  Number falls in past yr: 0 0 0 0 0  Injury with Fall? 0 0 0 0 0  Follow up - - - - -      Objective:   Vitals:   01/30/20 1513  BP: 130/90  Pulse: 86  Resp: 16  Temp: 97.6 F (36.4 C)  TempSrc: Oral  SpO2: 100%  Weight: 209 lb 12.8 oz (95.2 kg)  Height: 5\' 9"  (1.753 m)    Body mass index is 30.98 kg/m.  Physical Exam   NAD, masked HEENT - [place]/AT, sclera anicteric, PERRL, conj - non-inj'ed,  pharynx clear Neck - supple, no adenopathy,  Car - RRR, not tachycardic Pulm- RR and effort normal at rest,  Abd - soft, NT, ND, BS+,  no obvious masses Back -mild tenderness to palpation in both CVA regions, although she notes more tender on the left than right CVA region. Neuro/psychiatric - affect was not flat, appropriate with conversation  Alert  Speech normal   Results for orders placed or performed in visit on 01/30/20  POCT Urinalysis Dipstick  Result Value Ref Range   Color, UA Golden    Clarity, UA Clear    Glucose, UA Negative Negative   Bilirubin, UA Negative    Ketones, UA Negative    Spec Grav, UA 1.025 1.010 - 1.025   Blood, UA Positive    pH, UA 6.0 5.0 - 8.0   Protein, UA Negative Negative   Urobilinogen, UA 0.2 0.2 or 1.0 E.U./dL   Nitrite, UA Negative    Leukocytes, UA Negative Negative   Appearance Abnormal    Odor Abnormal    Urine dip was positive for blood, negative otherwise.  Did have an abnormal appearance and odor.    Assessment & Plan:   1. Urinary problem in female  - POCT  Urinalysis Dipstick  2. Acute cystitis with hematuria Noted she did have concerns for a possible urinary tract infection with the burning with urination, the strong odor, and blood in her urine.  With her flank plain, cannot exclude a possible pyelonephritis, although she has had more of a chronic flank pain issue, has not had fevers, no nausea or vomiting, and not ill-appearing today.  Cannot exclude a potential kidney stone concern, although she notes this is not nearly as severe as the pain she had with her prior kidney stone.  Doubt interstitial cystitis concern, although that is in the differential, especially noting persistence of UTI-like symptoms, low-grade, fairly regularly. Felt best to send the urine off for a complete urinalysis and culture Also start nitrofurantoin-twice daily for 5 days prescribed, as await the UA and culture.  The prior organism that was grown, Enterococcus in May, was sensitive to nitrofurantoin. Emphasized staying well-hydrated.  - nitrofurantoin, macrocrystal-monohydrate, (MACROBID) 100 MG capsule; Take 1 capsule (100 mg total) by mouth 2 (two) times daily for 5 days.  Dispense: 10 capsule; Refill: 1 - Ambulatory referral to Urology  3. Flank pain/history of kidney stones As above noted, cannot exclude a kidney stone concern.  She has a history of this previously. If the pain is increasing again, and seems more consistent with her prior kidney stone pain, likely will need a CT to further evaluate. She has had persistent flank pain since that stent placement on the right, with her flank pains more left-sided here in the recent past. Felt best to get an ultrasound to help evaluate presently. Also will base a referral to urology, as do feel further evaluation is likely needed,  and await their input.  - US Renal; Future - Ambulatory referral to Urology   Await her response to the antibiotic and the UA and culture results, as well as the ultrasound result and input  from urology over time.  If symptoms increasing again and more problematic, needs to follow-up, possibly in a more emergent setting if symptoms become more severe.         Towanda Malkin, MD 01/30/20 3:21 PM

## 2020-01-31 ENCOUNTER — Telehealth: Payer: Self-pay

## 2020-01-31 ENCOUNTER — Ambulatory Visit: Payer: 59 | Admitting: Internal Medicine

## 2020-01-31 NOTE — Telephone Encounter (Signed)
I tried to contact this patient to inform her that she has been scheduled to have an ultrasound on Wednesday, December 8th at 3:15pm at the [facility], but there was no answer and her mailbox was full so I was not able to leave a message.   A CRM will be placed.

## 2020-02-06 ENCOUNTER — Ambulatory Visit
Admission: RE | Admit: 2020-02-06 | Discharge: 2020-02-06 | Disposition: A | Discharge status: Home / Self Care | Payer: 59 | Source: Ambulatory Visit | Attending: Internal Medicine | Admitting: Internal Medicine

## 2020-02-06 ENCOUNTER — Other Ambulatory Visit: Payer: Self-pay

## 2020-02-06 DIAGNOSIS — R109 Unspecified abdominal pain: Secondary | ICD-10-CM | POA: Diagnosis present

## 2020-02-06 DIAGNOSIS — Z87442 Personal history of urinary calculi: Secondary | ICD-10-CM | POA: Diagnosis present

## 2020-02-07 ENCOUNTER — Ambulatory Visit: Payer: Self-pay | Admitting: *Deleted

## 2020-02-07 NOTE — Telephone Encounter (Signed)
[facility] radiology, Olivia Mackie calling in the renal ultrasound result for Dr. Roxan Hockey.  Result is in Epic.  I called [facility] and let them know about the called in report. I sent a message at their request to the office for Dr. Leonarda Salon nurse.

## 2020-02-25 ENCOUNTER — Ambulatory Visit: Payer: 59 | Admitting: Urology

## 2020-02-27 ENCOUNTER — Ambulatory Visit: Payer: 59 | Admitting: Urology

## 2020-02-27 ENCOUNTER — Encounter: Payer: Self-pay | Admitting: Urology

## 2020-02-27 ENCOUNTER — Ambulatory Visit
Admission: RE | Admit: 2020-02-27 | Discharge: 2020-02-27 | Disposition: A | Discharge status: Home / Self Care | Payer: 59 | Source: Ambulatory Visit | Attending: Urology | Admitting: Urology

## 2020-02-27 ENCOUNTER — Other Ambulatory Visit: Payer: Self-pay

## 2020-02-27 VITALS — BP 139/80 | HR 75 | Ht 69.0 in | Wt 205.1 lb

## 2020-02-27 DIAGNOSIS — N200 Calculus of kidney: Secondary | ICD-10-CM | POA: Insufficient documentation

## 2020-02-27 DIAGNOSIS — R310 Gross hematuria: Secondary | ICD-10-CM | POA: Diagnosis not present

## 2020-02-27 LAB — URINALYSIS, COMPLETE
Bilirubin, UA: NEGATIVE
Glucose, UA: NEGATIVE
Leukocytes,UA: NEGATIVE
Nitrite, UA: NEGATIVE
Specific Gravity, UA: 1.025 (ref 1.005–1.030)
Urobilinogen, Ur: 0.2 mg/dL (ref 0.2–1.0)
pH, UA: 5.5 (ref 5.0–7.5)

## 2020-02-27 LAB — MICROSCOPIC EXAMINATION
Bacteria, UA: NONE SEEN
RBC, Urine: >30 /hpf — AB (ref 0–2)

## 2020-02-27 NOTE — Progress Notes (Signed)
02/27/2020 8:47 AM   [name] July 05/20/1960 [id number]  Referring provider: Towanda Malkin, MD [address] [place],  [place] [address]  Chief Complaint  Patient presents with  . Nephrolithiasis    HPI: 59 y.o. female referred for hematuria, flank pain and history of kidney stones.   History recurrent stone disease and last saw Dr. Diamantina Providence 07/2018 after ureteroscopic removal of a 1 cm right ureteral calculus  Has been seen May and June by PCP for recurrent UTI symptoms  Urine culture + Enterococcus May 2021 and follow-up urine culture June 2021 without significant growth  Complains of intermittent voiding symptoms including frequency, urgency, dysuria and urge incontinence  Also has intermittent gross hematuria and flank pain which occurs bilaterally  Denies fever, chills  Occasional nausea without vomiting  CT abdomen pelvis November 2020 showed no abnormalities  Renal ultrasound 02/06/2020 showed a 1 cm right lower pole calculus which was noted to move to the right renal pelvis after voiding   PMH: Past Medical History:  Diagnosis Date  . Arthritis    hands, lower back  . Gross hematuria 06/29/2018  . History of kidney stones    still have 5 in right kidney  . Microscopic hematuria    hx of due to kidney stones  . Pelvic mass in female   . Pelvic mass in female 01/11/2018  . Tobacco abuse 03/22/2017    Surgical History: Past Surgical History:  Procedure Laterality Date  . back syugery  1993   lower lumbar  . Benign tumor  1993  . BUNIONECTOMY Left 2006  . COLONOSCOPY WITH PROPOFOL N/A 04/17/2019   Procedure: COLONOSCOPY WITH PROPOFOL;  Surgeon: Lin Landsman, MD;  Location: [place];  Service: Gastroenterology;  Laterality: N/A;  . CYSTOSCOPY/URETEROSCOPY/HOLMIUM LASER/STENT PLACEMENT Right 08/11/2018   Procedure: CYSTOSCOPY/URETEROSCOPY/HOLMIUM LASER/STENT PLACEMENT;  Surgeon: Billey Co, MD;  Location:  [facility];  Service: Urology;  Laterality: Right;  . LITHOTRIPSY    . POLYPECTOMY  04/17/2019   Procedure: POLYPECTOMY;  Surgeon: Lin Landsman, MD;  Location: [place];  Service: Gastroenterology;;  . ROBOTIC ASSISTED BILATERAL SALPINGO OOPHERECTOMY Bilateral 02/06/2018   Procedure: XI ROBOTIC ASSISTED BILATERAL SALPINGO OOPHORECTOMY , PELVIC WASHINGS;  Surgeon: Isabel Caprice, MD;  Location: [place];  Service: Gynecology;  Laterality: Bilateral;  . 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 No SOB, no hospitilization Has patient had a PCN reaction causing immediate rash, facial/tongue/throat swelling, SOB or lightheadedness with hypotension: No Has patient had a PCN reaction causing severe rash involving mucus membranes or skin necrosis: No Has patient had a PCN reaction that required hospitalization: No Has patient had a PCN reaction occurring within the last 10 years: No If all of the above answers are "NO", then may proceed with Cephalos      Medication List       Accurate as of February 27, 2020  8:47 AM. If you have any questions, ask your nurse or doctor.        acetaminophen 650 MG CR tablet Commonly known as: TYLENOL Take 650 mg by mouth every 8 (eight) hours as needed for pain.   cholecalciferol 1000 units tablet Commonly known as: VITAMIN D Take 1,000 Units  by mouth daily.       Allergies:  Allergies  Allergen Reactions  . Oxycodone Nausea And Vomiting  . Penicillins Rash and Other (See Comments)    Childhood allergy rash after 3 days. No SOB, no hospitilization Has patient had a PCN reaction causing immediate rash, facial/tongue/throat swelling, SOB or lightheadedness with hypotension: No Has patient had a PCN reaction causing severe rash  involving mucus membranes or skin necrosis: No Has patient had a PCN reaction that required hospitalization: No Has patient had a PCN reaction occurring within the last 10 years: No If all of the above answers are "NO", then may proceed with Cephalos    Family History: Family History  Problem Relation Age of Onset  . Congestive Heart Failure Mother   . Heart disease Mother   . Uterine cancer Mother   . Diabetes Father   . Heart disease Father   . Dementia Father   . Heart disease Sister   . Diabetes Sister     Social History:  reports that she has been smoking cigarettes. She has a 40.00 pack-year smoking history. She has never used smokeless tobacco. She reports that she does not drink alcohol and does not use drugs.   Physical Exam: BP 139/80   Pulse 75   Ht 5' 9"  (1.753 m)   Wt 205 lb 1.6 oz (93 kg)   BMI 30.29 kg/m   Constitutional:  Alert and oriented, No acute distress. HEENT: [place] AT, moist mucus membranes.  Trachea midline, no masses. Cardiovascular: No clubbing, cyanosis, or edema. Respiratory: Normal respiratory effort, no increased work of breathing. GI: Abdomen is soft, nontender, nondistended, no abdominal masses GU: No CVA tenderness Lymph: No cervical or inguinal lymphadenopathy. Skin: No rashes, bruises or suspicious lesions. Neurologic: Grossly intact, no focal deficits, moving all 4 extremities. Psychiatric: Normal mood and affect.  Laboratory Data:  Urinalysis Dipstick 3+ blood/trace protein Microscopy 0-5 WBC >30 RBC  Pertinent Imaging: Ultrasound and KUB today images personally reviewed   US Renal  Narrative CLINICAL DATA:  Persistent left greater than right flank pain. History of renal stone.  EXAM: RENAL / URINARY TRACT ULTRASOUND COMPLETE  COMPARISON:  CT abdomen pelvis 01/22/2019  FINDINGS: Right Kidney:  Renal measurements: 12.1 x 6.1 x 5.2 cm = volume: 202 mL. Echogenicity within normal limits. There is a 1 cm calcified  stone within the right inferior pole; the stone is noted to move into the right renal pelvis upon voiding. There is mild fullness of the right collecting system upon voiding with no definite frank hydronephrosis. No mass.  Left Kidney:  Renal measurements: 11.2 x 6 x 5.6 cm = volume: 192 mL. Echogenicity within normal limits. No mass or hydronephrosis visualized.  Urinary bladder:  Appears normal for degree of bladder distention.  Other:  None.  IMPRESSION: A 1 cm right calcified renal stone was noted to move into the renal pelvis of the right kidney upon voiding. There is mild fullness of the right collecting system upon voiding. Recommend repeat renal ultrasound or CT renal stone protocol if patient has persistent symptoms.  These results will be called to the ordering clinician or representative by the Radiologist Assistant, and communication documented in the PACS or [employer].   Electronically Signed By: Iven Finn M.D. On: 02/07/2020 00:04   Assessment & Plan:    1. Right renal stone  Recent renal ultrasound shows a calculus migrating to the renal pelvis which is the most likely cause of her hematuria  and right flank pain We discussed various treatment options for urolithiasis including observation with or without medical expulsive therapy, shockwave lithotripsy (SWL), ureteroscopy and laser lithotripsy with stent placement. We discussed that management is based on stone size, location, density, patient co-morbidities, and patient preference.  Stones <68m in size have a >80% spontaneous passage rate. Data surrounding the use of tamsulosin for medical expulsive therapy is controversial, but meta analyses suggests it is most efficacious for distal stones between 5-185min size. SWL has a lower stone free rate in a single procedure, but also a lower complication rate compared to ureteroscopy and avoids a stent and associated stent related symptoms. Possible  complications include renal hematoma, steinstrasse, and need for additional treatment. Ureteroscopy with laser lithotripsy and stent placement has a higher stone free rate than SWL in a single procedure, however increased complication rate including possible infection, ureteral injury, bleeding, and stent related morbidity. Common stent related symptoms include dysuria, urgency/frequency, and flank pain. Based on stone size and location unlikely it will be able to pass and did not recommend MET She did not tolerate her last ureteral stent well and would like to avoid if at all possible KUB was obtained and the stone is visualized and does not appear dense.  She desires to schedule shockwave lithotripsy We discussed treatment may not improve her left flank pain or voiding symptoms   ScAbbie SonsMD  [id number] [id number]

## 2020-03-20 IMAGING — CT CT RENAL STONE PROTOCOL
2 of 4 series · 17 of 46 positions shown, 19 images · non-contrast
Comparison: May 17, 2018.

CLINICAL DATA: Flank pain with stone disease suspected. Left lower
quadrant pain since [REDACTED].

EXAM:
CT ABDOMEN AND PELVIS WITHOUT CONTRAST
TECHNIQUE: Multidetector CT imaging of the abdomen and pelvis was performed
following the standard protocol without IV contrast.

[Series 2: stone full standard · axial · 0.79mm/px · z∈[-939,-534]mm · 14 of 89 slices shown, 16 images]
[im 4/89  soft-tissue]
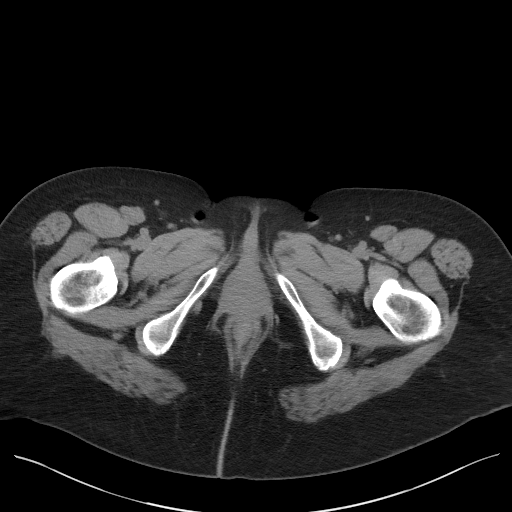
[im 4/89  bone]
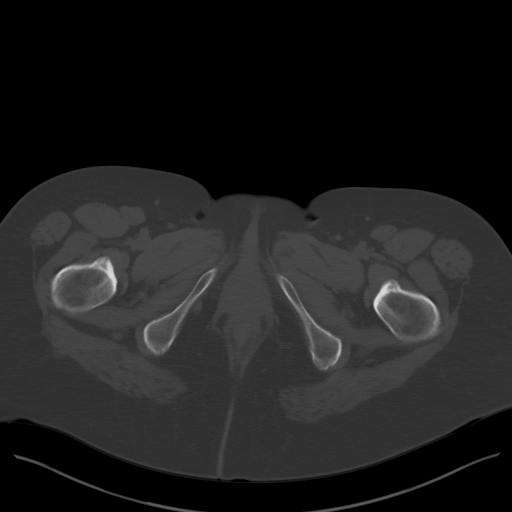
[im 12/89  soft-tissue]
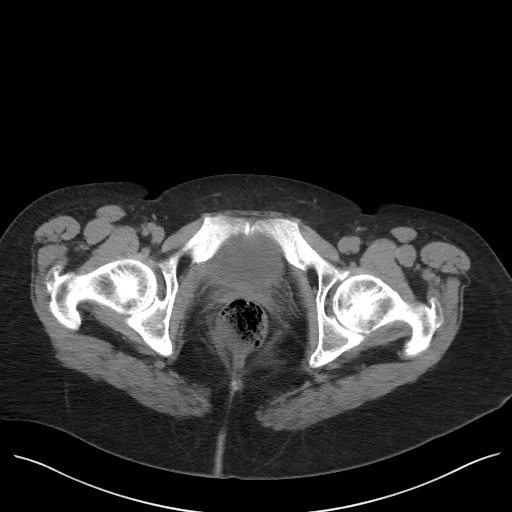
[im 19/89  soft-tissue]
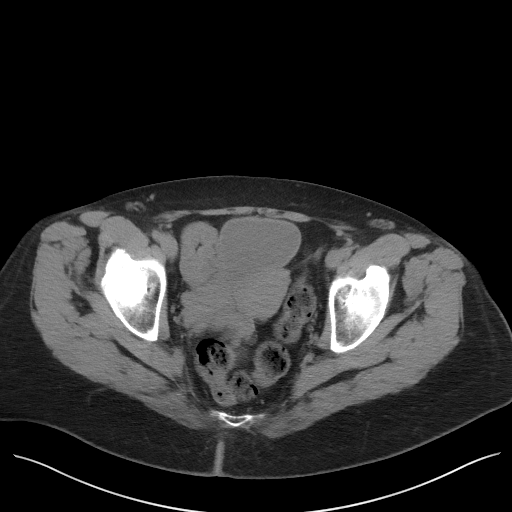
[im 23/89  soft-tissue]
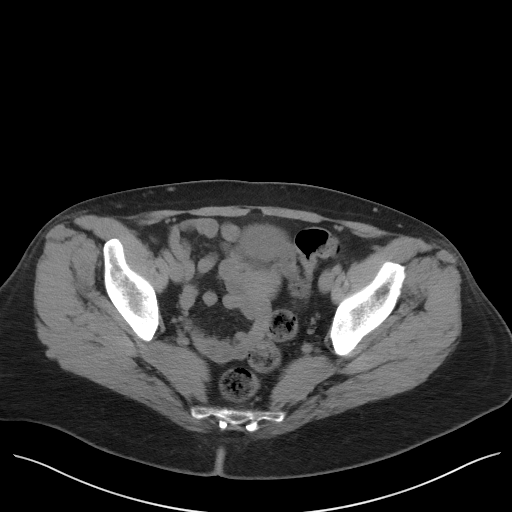
[im 30/89  soft-tissue]
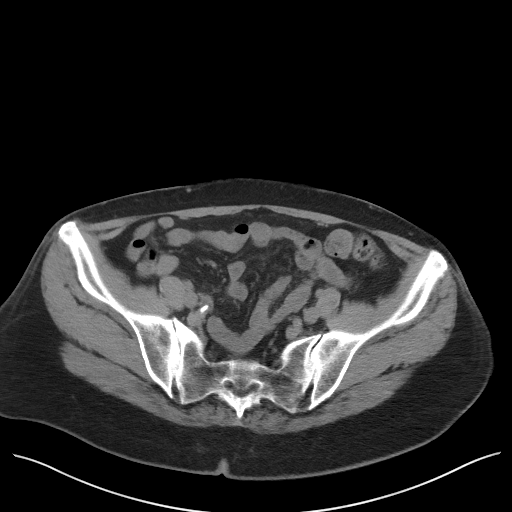
[im 37/89  soft-tissue]
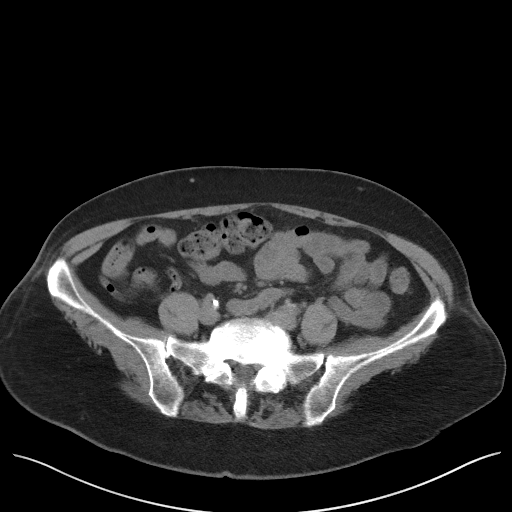
[im 41/89  soft-tissue]
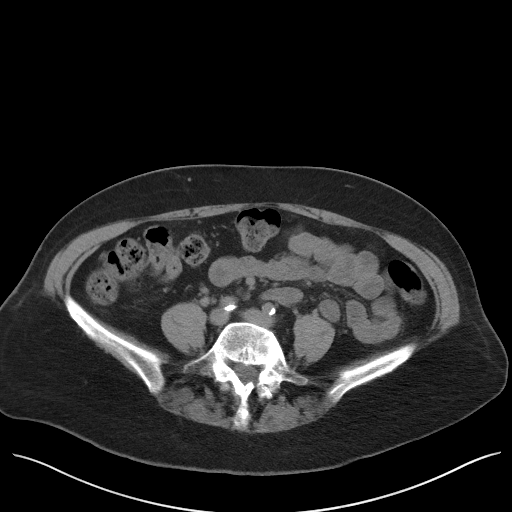
[im 48/89  soft-tissue]
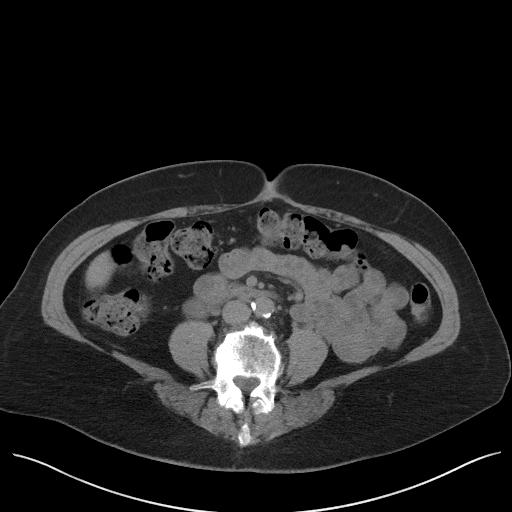
[im 52/89  soft-tissue]
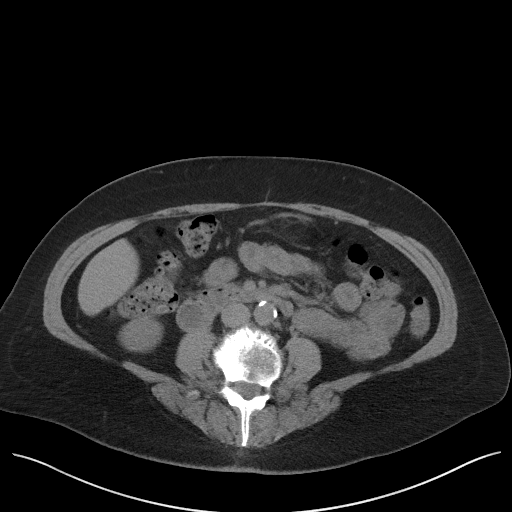
[im 52/89  bone]
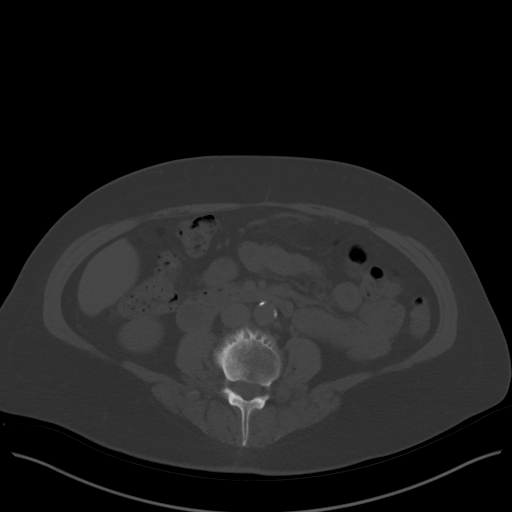
[im 59/89  soft-tissue]
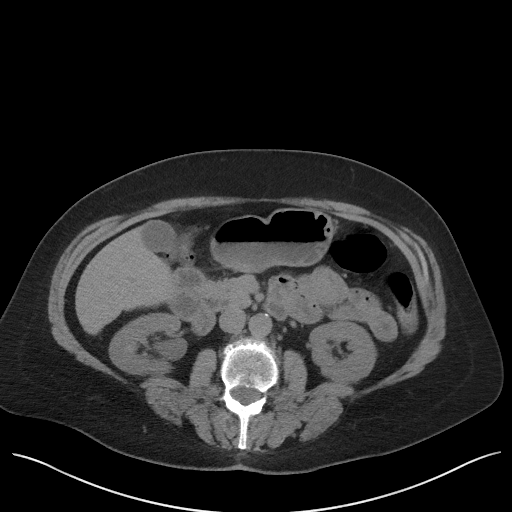
[im 67/89  soft-tissue]
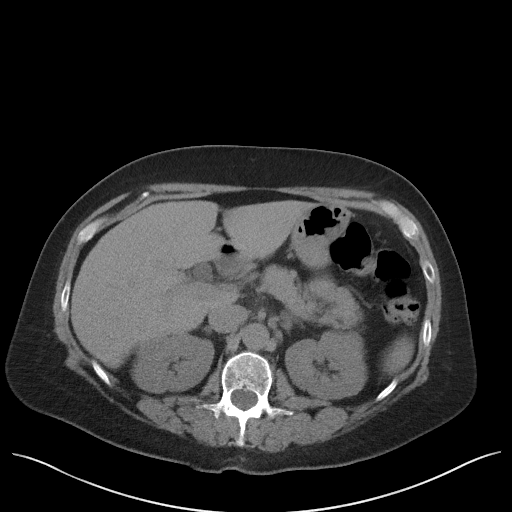
[im 70/89  soft-tissue]
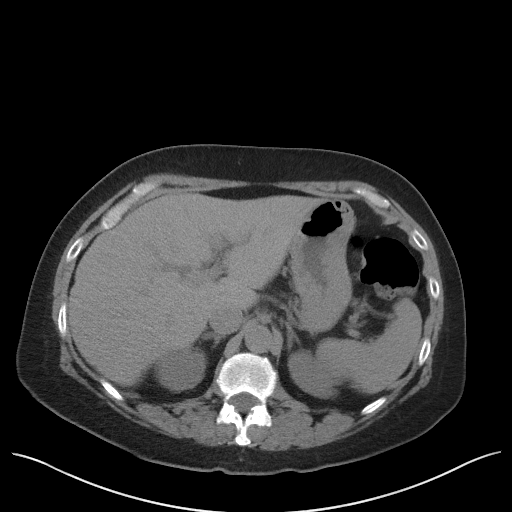
[im 78/89  soft-tissue]
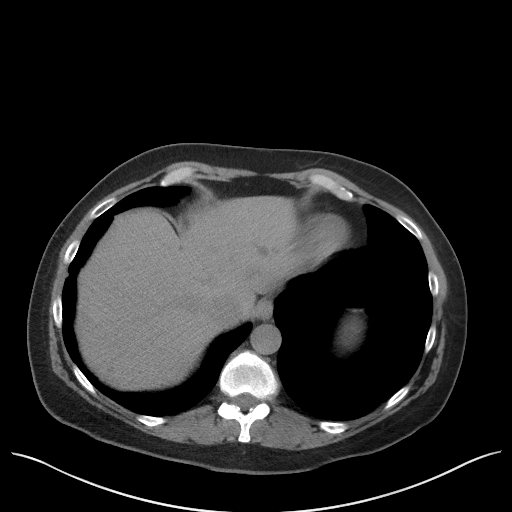
[im 85/89  soft-tissue]
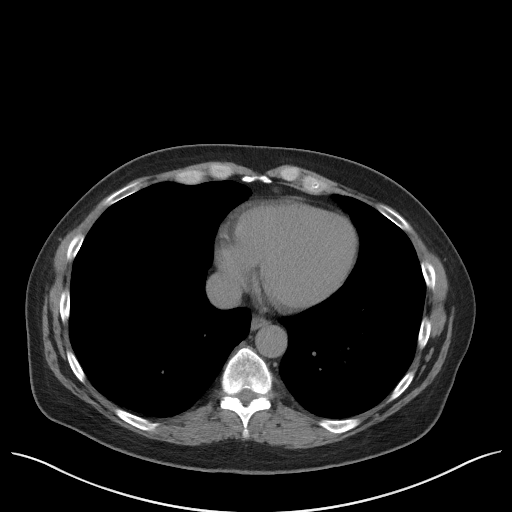

[Series 5: coronal · coronal · 0.81mm/px · 3 of 133 slices shown]
[im 45/133  soft-tissue]
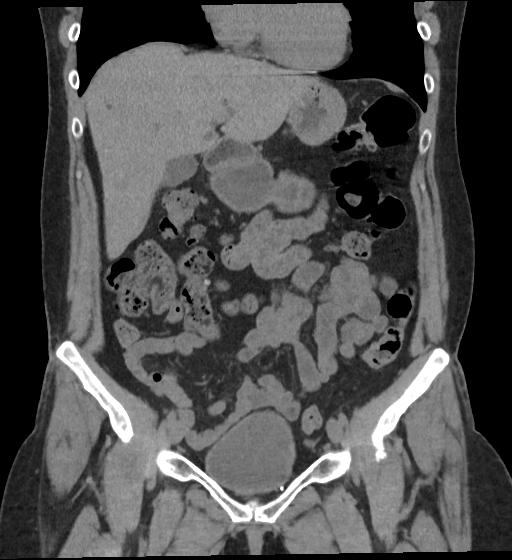
[im 59/133  soft-tissue]
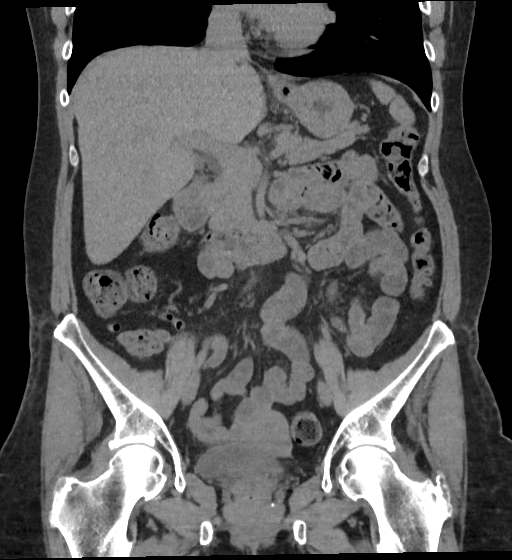
[im 74/133  soft-tissue]
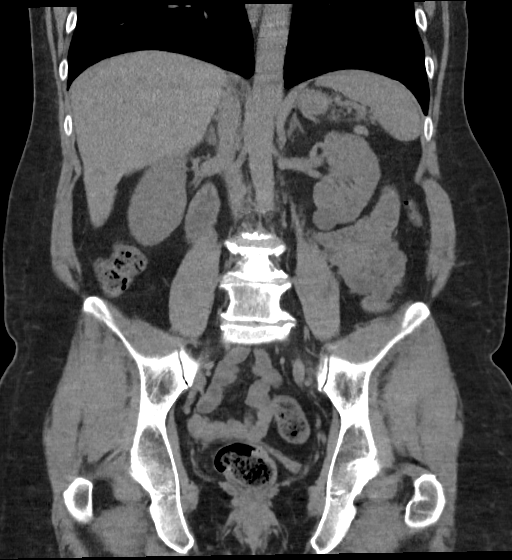

[17 of 46 positions shown; findings below may reference images not displayed]

FINDINGS: Lower chest: The lung bases are clear. The heart size is normal.

Hepatobiliary: The liver is normal. Normal gallbladder.There is no
biliary ductal dilation.

Pancreas: Normal contours without ductal dilatation. No
peripancreatic fluid collection.

Spleen: No splenic laceration or hematoma.

Adrenals/Urinary Tract:

--Adrenal glands: No adrenal hemorrhage.

--Right kidney/ureter: There is a tiny nonobstructing stone in the
lower pole the right kidney.

--Left kidney/ureter: No hydronephrosis or perinephric hematoma.

--Urinary bladder: Unremarkable.

Stomach/Bowel:

--Stomach/Duodenum: No hiatal hernia or other gastric abnormality.
Normal duodenal course and caliber.

--Small bowel: No dilatation or inflammation.

--Colon: No focal abnormality.

--Appendix: Normal.

Vascular/Lymphatic: Atherosclerotic calcification is present within
the non-aneurysmal abdominal aorta, without hemodynamically
significant stenosis.

--No retroperitoneal lymphadenopathy.

--No mesenteric lymphadenopathy.

--No pelvic or inguinal lymphadenopathy.

Reproductive: Unremarkable

Other: No ascites or free air. The abdominal wall is normal.

Musculoskeletal. No acute displaced fractures.
IMPRESSION: 1. No acute abdominopelvic abnormality.
2. Nonobstructive right nephrolithiasis.
3. Aortic atherosclerosis.

Aortic Atherosclerosis (BCV9H-9KG.G).

## 2020-03-24 ENCOUNTER — Telehealth: Payer: Self-pay | Admitting: Radiology

## 2020-03-24 NOTE — Telephone Encounter (Signed)
Unable to reach patient by phone. Voicemail was full. Need to discuss ESWL with Dr Bernardo Heater.

## 2020-03-24 NOTE — Telephone Encounter (Signed)
Discussed ESWL information with patient. Urine culture scheduled. Patient will fill out paperwork at that time.

## 2020-04-10 ENCOUNTER — Other Ambulatory Visit: Payer: Self-pay

## 2020-04-10 ENCOUNTER — Other Ambulatory Visit: Payer: Self-pay | Admitting: Radiology

## 2020-04-10 ENCOUNTER — Other Ambulatory Visit: Payer: 59

## 2020-04-10 DIAGNOSIS — N200 Calculus of kidney: Secondary | ICD-10-CM

## 2020-04-17 ENCOUNTER — Encounter: Payer: Self-pay | Admitting: Urology

## 2020-04-17 ENCOUNTER — Ambulatory Visit: Payer: 59

## 2020-04-17 ENCOUNTER — Encounter
Admission: RE | Disposition: A | Discharge status: Home / Self Care | Payer: Self-pay | Source: Home / Self Care | Attending: Urology

## 2020-04-17 ENCOUNTER — Ambulatory Visit
Admission: RE | Admit: 2020-04-17 | Discharge: 2020-04-17 | Disposition: A | Discharge status: Home / Self Care | Payer: 59 | Attending: Urology | Admitting: Urology

## 2020-04-17 ENCOUNTER — Other Ambulatory Visit: Payer: Self-pay | Admitting: Urology

## 2020-04-17 DIAGNOSIS — Z885 Allergy status to narcotic agent status: Secondary | ICD-10-CM | POA: Diagnosis not present

## 2020-04-17 DIAGNOSIS — F17210 Nicotine dependence, cigarettes, uncomplicated: Secondary | ICD-10-CM | POA: Diagnosis not present

## 2020-04-17 DIAGNOSIS — Z880 Allergy status to penicillin: Secondary | ICD-10-CM | POA: Diagnosis not present

## 2020-04-17 DIAGNOSIS — N200 Calculus of kidney: Secondary | ICD-10-CM | POA: Diagnosis present

## 2020-04-17 HISTORY — PX: EXTRACORPOREAL SHOCK WAVE LITHOTRIPSY: SHX1557

## 2020-04-17 LAB — CULTURE, URINE COMPREHENSIVE

## 2020-04-17 SURGERY — LITHOTRIPSY, ESWL
Anesthesia: Moderate Sedation | Laterality: Right

## 2020-04-17 MED ORDER — DIAZEPAM 5 MG PO TABS
10.0000 mg | ORAL_TABLET | ORAL | Status: AC
  Administered 2020-04-17: 10 mg via ORAL

## 2020-04-17 MED ORDER — CIPROFLOXACIN HCL 500 MG PO TABS: 500.0000 mg | ORAL_TABLET | Freq: Two times a day (BID) | ORAL | 0 refills | Status: AC

## 2020-04-17 MED ORDER — DIPHENHYDRAMINE HCL 25 MG PO CAPS
ORAL_CAPSULE | ORAL | Status: AC
  Administered 2020-04-17: 25 mg via ORAL
  Filled 2020-04-17: qty 1

## 2020-04-17 MED ORDER — DIAZEPAM 5 MG PO TABS
ORAL_TABLET | ORAL | Status: AC
  Filled 2020-04-17: qty 2

## 2020-04-17 MED ORDER — ONDANSETRON HCL 4 MG/2ML IJ SOLN: 4.0000 mg | Freq: Once | INTRAMUSCULAR | Status: AC | PRN

## 2020-04-17 MED ORDER — SODIUM CHLORIDE 0.9 % IV SOLN: INTRAVENOUS | Status: DC

## 2020-04-17 MED ORDER — HYDROCODONE-ACETAMINOPHEN 5-325 MG PO TABS
ORAL_TABLET | ORAL | Status: AC
  Filled 2020-04-17: qty 1

## 2020-04-17 MED ORDER — CIPROFLOXACIN HCL 500 MG PO TABS
ORAL_TABLET | ORAL | Status: AC
  Administered 2020-04-17: 500 mg via ORAL
  Filled 2020-04-17: qty 1

## 2020-04-17 MED ORDER — ONDANSETRON HCL 4 MG/2ML IJ SOLN
INTRAMUSCULAR | Status: AC
  Administered 2020-04-17: 4 mg via INTRAVENOUS
  Filled 2020-04-17: qty 2

## 2020-04-17 MED ORDER — HYDROCODONE-ACETAMINOPHEN 5-325 MG PO TABS: 1.0000 | ORAL_TABLET | Freq: Four times a day (QID) | ORAL | 0 refills | Status: DC | PRN

## 2020-04-17 MED ORDER — TAMSULOSIN HCL 0.4 MG PO CAPS: 0.4000 mg | ORAL_CAPSULE | Freq: Every day | ORAL | 0 refills | Status: DC

## 2020-04-17 MED ORDER — DIPHENHYDRAMINE HCL 25 MG PO CAPS: 25.0000 mg | ORAL_CAPSULE | ORAL | Status: AC

## 2020-04-17 MED ORDER — HYDROCODONE-ACETAMINOPHEN 5-325 MG PO TABS
1.0000 | ORAL_TABLET | Freq: Four times a day (QID) | ORAL | Status: DC | PRN
  Administered 2020-04-17: 1 via ORAL

## 2020-04-17 MED ORDER — CIPROFLOXACIN HCL 500 MG PO TABS: 500.0000 mg | ORAL_TABLET | ORAL | Status: AC

## 2020-04-17 NOTE — H&P (Signed)
Urology H&P  Chief Complaint: Kidney stone  History of Present Illness: Sharon Wright is a 60 y.o. female with a history of intermittent right flank pain.  A renal ultrasound early January 21 showed a 1 cm right lower pole calculus which migrated to the right renal pelvis after voiding.  She presents today for shockwave lithotripsy.  Past Medical History:  Diagnosis Date  . Arthritis    hands, lower back  . Gross hematuria 06/29/2018  . History of kidney stones    still have 5 in right kidney  . Microscopic hematuria    hx of due to kidney stones  . Pelvic mass in female   . Pelvic mass in female 01/11/2018  . Tobacco abuse 03/22/2017    Past Surgical History:  Procedure Laterality Date  . back syugery  1993   lower lumbar  . Benign tumor  1993  . BUNIONECTOMY Left 2006  . COLONOSCOPY WITH PROPOFOL N/A 04/17/2019   Procedure: COLONOSCOPY WITH PROPOFOL;  Surgeon: Lin Landsman, MD;  Location: [place];  Service: Gastroenterology;  Laterality: N/A;  . CYSTOSCOPY/URETEROSCOPY/HOLMIUM LASER/STENT PLACEMENT Right 08/11/2018   Procedure: CYSTOSCOPY/URETEROSCOPY/HOLMIUM LASER/STENT PLACEMENT;  Surgeon: Billey Co, MD;  Location: [facility];  Service: Urology;  Laterality: Right;  . LITHOTRIPSY    . POLYPECTOMY  04/17/2019   Procedure: POLYPECTOMY;  Surgeon: Lin Landsman, MD;  Location: [place];  Service: Gastroenterology;;  . ROBOTIC ASSISTED BILATERAL SALPINGO OOPHERECTOMY Bilateral 02/06/2018   Procedure: XI ROBOTIC ASSISTED BILATERAL SALPINGO OOPHORECTOMY , PELVIC WASHINGS;  Surgeon: Isabel Caprice, MD;  Location: [place];  Service: Gynecology;  Laterality: Bilateral;  . 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 acetaminophen (TYLENOL) 650 MG CR tablet Take 650 mg by mouth every 8 (eight) hours as needed for pain.  . cholecalciferol (VITAMIN D) 1000  UNITS tablet Take 1,000 Units by mouth daily.     Allergies:  Allergies  Allergen Reactions  . Oxycodone Nausea And Vomiting  . Penicillins Rash and Other (See Comments)    Childhood allergy rash after 3 days. No SOB, no hospitilization Has patient had a PCN reaction causing immediate rash, facial/tongue/throat swelling, SOB or lightheadedness with hypotension: No Has patient had a PCN reaction causing severe rash involving mucus membranes or skin necrosis: No Has patient had a PCN reaction that required hospitalization: No Has patient had a PCN reaction occurring within the last 10 years: No If all of the above answers are "NO", then may proceed with Cephalos    Family History  Problem Relation Age of Onset  . Congestive Heart Failure Mother   . Heart disease Mother   . Uterine cancer Mother   . Diabetes Father   . Heart disease Father   . Dementia Father   . Heart disease Sister   . Diabetes Sister     Social History:  reports that she has been smoking cigarettes. She has a 40.00 pack-year smoking history. She has never used smokeless tobacco. She reports that she does not drink alcohol and does not use drugs.  ROS: A complete review of systems was performed.  All systems are negative except for pertinent findings as noted.  Physical Exam:  Vital signs in last 24 hours: Temp:  [97.2 F (36.2 C)] 97.2 F (36.2 C) (02/17 0623) Pulse Rate:  [64] 64 (02/17 0623) Resp:  [  12] 12 (02/17 0623) BP: (111)/(47) 111/47 (02/17 0623) SpO2:  [97 %] 97 % (02/17 0623) Weight:  [93 kg] 93 kg (02/16 1656) Constitutional:  Alert and oriented, No acute distress HEENT: [place] AT, moist mucus membranes.  Trachea midline, no masses Cardiovascular: Regular rate and rhythm, no clubbing, cyanosis, or edema. Respiratory: Normal respiratory effort, lungs clear bilaterally GI: Abdomen is soft, nontender, nondistended, no abdominal masses   Impression/Plan:   1.  Right renal calculus  She  presents today for shockwave lithotripsy  The indications and nature of the planned procedure were discussed as well as the potential benefits and expected outcome.  Alternatives were discussed as described above.  The most common complications and side effects were discussed as outlined in the [facility] consent form.  It was stressed that there is no guarantee that lithotripsy will be successful and she could require retreatment or alternative treatment.  The rare instance of perirenal bleeding requiring hospitalization, transfusion and rarely surgery were discussed.  The possibility of renal colic from obstructing stone fragments requiring stent placement or ureteroscopy was also discussed.   04/17/2020, 7:46 AM  John Giovanni,  MD

## 2020-04-17 NOTE — Interval H&P Note (Signed)
History and Physical Interval Note:  04/17/2020 7:51 AM  Sharon Wright  has presented today for surgery, with the diagnosis of Kidney stone.  The various methods of treatment have been discussed with the patient and family. After consideration of risks, benefits and other options for treatment, the patient has consented to  Procedure(s): EXTRACORPOREAL SHOCK WAVE LITHOTRIPSY (ESWL) (Right) as a surgical intervention.  The patient's history has been reviewed, patient examined, no change in status, stable for surgery.  I have reviewed the patient's chart and labs.  Questions were answered to the patient's satisfaction.     [place]

## 2020-04-17 NOTE — OR Nursing (Signed)
Red spot on right side with no skin tear

## 2020-04-17 NOTE — Discharge Instructions (Signed)
Discharge instructions  As per the [facility] stone center discharge instructions  Prescriptions for hydrocodone (pain) and tamsulosin (to help pass stone fragments) were sent to pharmacy  Follow-up appointment scheduled 05/02/2020  AMBULATORY SURGERY  DISCHARGE INSTRUCTIONS   1) The drugs that you were given will stay in your system until tomorrow so for the next 24 hours you should not:  A) Drive an automobile B) Make any legal decisions C) Drink any alcoholic beverage   2) You may resume regular meals tomorrow.  Today it is better to start with liquids and gradually work up to solid foods.  You may eat anything you prefer, but it is better to start with liquids, then soup and crackers, and gradually work up to solid foods.   3) Please notify your doctor immediately if you have any unusual bleeding, trouble breathing, redness and pain at the surgery site, drainage, fever, or pain not relieved by medication.    4) Additional Instructions:   Please contact your physician with any problems or Same Day Surgery at [phone], Monday through Friday 6 am to 4 pm, or [place] at [facility] number at [phone].

## 2020-04-18 ENCOUNTER — Encounter: Payer: Self-pay | Admitting: Urology

## 2020-04-18 ENCOUNTER — Telehealth: Payer: Self-pay | Admitting: Urology

## 2020-04-18 MED ORDER — OXYCODONE HCL 5 MG PO TABS: 5.0000 mg | ORAL_TABLET | Freq: Four times a day (QID) | ORAL | 0 refills | Status: DC | PRN

## 2020-04-18 MED ORDER — ONDANSETRON HCL 4 MG PO TABS: 4.0000 mg | ORAL_TABLET | Freq: Three times a day (TID) | ORAL | 0 refills | Status: DC | PRN

## 2020-04-18 NOTE — Telephone Encounter (Signed)
In regard to telephone encounter this date 15:31 and Rx immediate release oxycodone was sent to pharmacy along with Zofran.  Patient was advised to proceed to ED for pain not controlled with oral medication

## 2020-04-18 NOTE — Telephone Encounter (Signed)
Per Dr. Bernardo Heater: "The only medication that is stronger is oxycodone and it is listed in her allergies that she has nausea and vomiting with this medication. I did send in a limited Rx along with Zofran for nausea. If her pain does not improve would recommend ED visit for IV pain control"  Patient advised.

## 2020-04-18 NOTE — Telephone Encounter (Signed)
Patient called the office today to report that the pain medication prescribed is not taking care of the pain.  She is having breakthrough pain reporting as much as 9/10 on the pain scale.    She is requesting something more for the pain.  Please advise.

## 2020-05-02 ENCOUNTER — Other Ambulatory Visit: Payer: Self-pay | Admitting: Radiology

## 2020-05-02 ENCOUNTER — Ambulatory Visit: Payer: 59 | Admitting: Physician Assistant

## 2020-05-02 ENCOUNTER — Encounter: Payer: Self-pay | Admitting: Physician Assistant

## 2020-05-02 ENCOUNTER — Ambulatory Visit
Admission: RE | Admit: 2020-05-02 | Discharge: 2020-05-02 | Disposition: A | Discharge status: Home / Self Care | Payer: 59 | Attending: Physician Assistant | Admitting: Physician Assistant

## 2020-05-02 ENCOUNTER — Other Ambulatory Visit: Payer: Self-pay

## 2020-05-02 ENCOUNTER — Ambulatory Visit
Admission: RE | Admit: 2020-05-02 | Discharge: 2020-05-02 | Disposition: A | Discharge status: Home / Self Care | Payer: 59 | Source: Ambulatory Visit | Attending: Physician Assistant | Admitting: Physician Assistant

## 2020-05-02 VITALS — BP 104/72 | HR 66 | Ht 69.0 in | Wt 200.0 lb

## 2020-05-02 DIAGNOSIS — N200 Calculus of kidney: Secondary | ICD-10-CM

## 2020-05-02 LAB — MICROSCOPIC EXAMINATION: Bacteria, UA: NONE SEEN

## 2020-05-02 LAB — URINALYSIS, COMPLETE
Bilirubin, UA: NEGATIVE
Glucose, UA: NEGATIVE
Ketones, UA: NEGATIVE
Leukocytes,UA: NEGATIVE
Nitrite, UA: NEGATIVE
Protein,UA: NEGATIVE
Specific Gravity, UA: 1.02 (ref 1.005–1.030)
Urobilinogen, Ur: 0.2 mg/dL (ref 0.2–1.0)
pH, UA: 6.5 (ref 5.0–7.5)

## 2020-05-02 NOTE — Progress Notes (Signed)
05/02/2020 2:47 PM   Sharon Wright [birth year] [id number]  CC: Chief Complaint  Patient presents with  . Follow-up    2 week post litho with KUB   HPI: Sharon Wright is a 60 y.o. female with PMH nephrolithiasis, high risk gross hematuria, and atypical urine cytology with normal cystoscopy under anesthesia in June 2020 who underwent ESWL with Dr. Bernardo Heater on 04/17/2020 for management of a right renal pelvic stone who presents today for postop follow-up.  Today she reports resolution of right flank pain following procedure.  She passed multiple fragments over the first week after the shockwave and brings them with her to clinic today.  She has had some intermittent left flank pain.  She is no longer taking Flomax and is very concerned about a possible UTI.  She denies dysuria, urgency, and frequency.  KUB today reveals interval clearance of the 1 cm right renal pelvic stone.  There is overlying stool and bowel gas, however it appears there may be some residual fragments that have settled into the right lower pole.  No evidence of ureteral fragments.  In-office UA today positive for trace lysed blood; urine microscopy with 3-10 RBCs/HPF.  PMH: Past Medical History:  Diagnosis Date  . Arthritis    hands, lower back  . Gross hematuria 06/29/2018  . History of kidney stones    still have 5 in right kidney  . Microscopic hematuria    hx of due to kidney stones  . Pelvic mass in female   . Pelvic mass in female 01/11/2018  . Tobacco abuse 03/22/2017    Surgical History: Past Surgical History:  Procedure Laterality Date  . back syugery  1993   lower lumbar  . Benign tumor  1993  . BUNIONECTOMY Left 2006  . COLONOSCOPY WITH PROPOFOL N/A 04/17/2019   Procedure: COLONOSCOPY WITH PROPOFOL;  Surgeon: Lin Landsman, MD;  Location: [facility];  Service: Gastroenterology;  Laterality: N/A;  . CYSTOSCOPY/URETEROSCOPY/HOLMIUM LASER/STENT PLACEMENT Right 08/11/2018    Procedure: CYSTOSCOPY/URETEROSCOPY/HOLMIUM LASER/STENT PLACEMENT;  Surgeon: Billey Co, MD;  Location: [facility];  Service: Urology;  Laterality: Right;  . EXTRACORPOREAL SHOCK WAVE LITHOTRIPSY Right 04/17/2020   Procedure: EXTRACORPOREAL SHOCK WAVE LITHOTRIPSY (ESWL);  Surgeon: Abbie Sons, MD;  Location: [facility];  Service: Urology;  Laterality: Right;  . LITHOTRIPSY    . POLYPECTOMY  04/17/2019   Procedure: POLYPECTOMY;  Surgeon: Lin Landsman, MD;  Location: [place];  Service: Gastroenterology;;  . ROBOTIC ASSISTED BILATERAL SALPINGO OOPHERECTOMY Bilateral 02/06/2018   Procedure: XI ROBOTIC ASSISTED BILATERAL SALPINGO OOPHORECTOMY , PELVIC WASHINGS;  Surgeon: Isabel Caprice, MD;  Location: [place];  Service: Gynecology;  Laterality: Bilateral;  . 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 No SOB, no hospitilization Has patient had a PCN reaction causing immediate rash, facial/tongue/throat swelling, SOB or lightheadedness with hypotension: No Has patient had a PCN reaction causing severe rash involving mucus membranes or skin necrosis: No Has patient had a PCN reaction that required hospitalization: No Has patient had a PCN reaction occurring within the last 10 years: No If all of the above answers are "NO", then may proceed with Cephalos      Medication List  Accurate as of May 02, 2020  2:47 PM. If you have any questions, ask your nurse or doctor.        STOP taking these medications   HYDROcodone-acetaminophen 5-325 MG tablet Commonly known as: NORCO/VICODIN Stopped by: Debroah Loop, PA-C   ondansetron 4 MG tablet Commonly known as: ZOFRAN Stopped by: Debroah Loop, PA-C   oxyCODONE 5 MG immediate release tablet Commonly  known as: Oxy IR/ROXICODONE Stopped by: Debroah Loop, PA-C   tamsulosin 0.4 MG Caps capsule Commonly known as: FLOMAX Stopped by: Debroah Loop, PA-C     TAKE these medications   acetaminophen 650 MG CR tablet Commonly known as: TYLENOL Take 650 mg by mouth every 8 (eight) hours as needed for pain.   cholecalciferol 1000 units tablet Commonly known as: VITAMIN D Take 1,000 Units by mouth daily.       Allergies:  Allergies  Allergen Reactions  . Oxycodone Nausea And Vomiting  . Penicillins Rash and Other (See Comments)    Childhood allergy rash after 3 days. No SOB, no hospitilization Has patient had a PCN reaction causing immediate rash, facial/tongue/throat swelling, SOB or lightheadedness with hypotension: No Has patient had a PCN reaction causing severe rash involving mucus membranes or skin necrosis: No Has patient had a PCN reaction that required hospitalization: No Has patient had a PCN reaction occurring within the last 10 years: No If all of the above answers are "NO", then may proceed with Cephalos    Family History: Family History  Problem Relation Age of Onset  . Congestive Heart Failure Mother   . Heart disease Mother   . Uterine cancer Mother   . Diabetes Father   . Heart disease Father   . Dementia Father   . Heart disease Sister   . Diabetes Sister     Social History:   reports that she has been smoking cigarettes. She has a 40.00 pack-year smoking history. She has never used smokeless tobacco. She reports that she does not drink alcohol and does not use drugs.  Physical Exam: BP 104/72   Pulse 66   Ht 5\' 9"  (1.753 m)   Wt 200 lb (90.7 kg)   BMI 29.53 kg/m   Constitutional:  Alert and oriented, no acute distress, nontoxic appearing HEENT: , AT Cardiovascular: No clubbing, cyanosis, or edema Respiratory: Normal respiratory effort, no increased work of breathing Skin: No rashes, bruises or suspicious lesions Neurologic:  Grossly intact, no focal deficits, moving all 4 extremities Psychiatric: Normal mood and affect  Laboratory Data: Results for orders placed or performed in visit on 05/02/20  Microscopic Examination   Urine  Result Value Ref Range   WBC, UA 0-5 0 - 5 /hpf   RBC 3-10 (A) 0 - 2 /hpf   Epithelial Cells (non renal) 0-10 0 - 10 /hpf   Casts Present (A) None seen /lpf   Cast Type Hyaline casts N/A   Bacteria, UA None seen None seen/Few  Urinalysis, Complete  Result Value Ref Range   Specific Gravity, UA 1.020 1.005 - 1.030   pH, UA 6.5 5.0 - 7.5   Color, UA Yellow Yellow   Appearance Ur Clear Clear   Leukocytes,UA Negative Negative   Protein,UA Negative Negative/Trace   Glucose, UA Negative Negative   Ketones, UA Negative Negative   RBC, UA Trace (A) Negative   Bilirubin, UA Negative Negative   Urobilinogen, Ur 0.2 0.2 - 1.0 mg/dL   Nitrite, UA Negative Negative   Microscopic Examination See below:  Pertinent Imaging: KUB, 05/02/2020: CLINICAL DATA:  Right renal stone.  EXAM: ABDOMEN - 1 VIEW  COMPARISON:  April 17, 2020  FINDINGS: Left kidney is largely obscured by bowel contents. The previously identified right renal stone is no longer visualized in the right kidney. No ureteral stones identified. Phleboliths seen in the left pelvis.  IMPRESSION: The previously seen right renal stone is no longer visualized. No renal or ureteral stones noted today.   Electronically Signed   By: Dorise Bullion III M.D   On: 05/04/2020 14:44  I personally reviewed the images referenced above and note resolution of the large right renal pelvic stone.  There may be some residual fragments that have settled into the right lower pole, however difficult to say with certainty due to overlying stool and bowel gas.  Assessment & Plan:   1. Right renal stone Patient has passed multiple fragments and pain has resolved.  UA today benign save for mild microscopic hematuria consistent  with ongoing fragment passage versus residual bleeding from recent passage.  She does appear to have microscopic hematuria at baseline and so we may not clear this.  We will plan for 4-week follow-up with repeat UA and renal ultrasound prior.  If renal ultrasound shows no hydronephrosis and she has persistent microheme, will plan for routine follow-up for this given her high risk status. - Urinalysis, Complete - US RENAL; Future - Calculi, with Photograph (to Clinical Lab)  Return in about 4 weeks (around 05/30/2020) for Stone f/u with UA + RUS prior.  Debroah Loop, PA-C  [facility] [address] [place], [place] [address] [phone]

## 2020-05-09 LAB — CALCULI, WITH PHOTOGRAPH (CLINICAL LAB)
Calcium Oxalate Dihydrate: 70 %
Calcium Oxalate Monohydrate: 25 %
Hydroxyapatite: 5 %
Weight Calculi: 96 mg

## 2020-05-29 ENCOUNTER — Other Ambulatory Visit: Payer: Self-pay

## 2020-05-29 ENCOUNTER — Ambulatory Visit
Admission: RE | Admit: 2020-05-29 | Discharge: 2020-05-29 | Disposition: A | Discharge status: Home / Self Care | Payer: 59 | Source: Ambulatory Visit | Attending: Physician Assistant | Admitting: Physician Assistant

## 2020-05-29 DIAGNOSIS — N200 Calculus of kidney: Secondary | ICD-10-CM | POA: Diagnosis present

## 2020-05-30 ENCOUNTER — Encounter: Payer: Self-pay | Admitting: Physician Assistant

## 2020-05-30 ENCOUNTER — Ambulatory Visit: Payer: 59 | Admitting: Physician Assistant

## 2020-05-30 VITALS — BP 141/95 | HR 85 | Ht 69.0 in | Wt 200.0 lb

## 2020-05-30 DIAGNOSIS — R3129 Other microscopic hematuria: Secondary | ICD-10-CM | POA: Diagnosis not present

## 2020-05-30 DIAGNOSIS — N200 Calculus of kidney: Secondary | ICD-10-CM | POA: Diagnosis not present

## 2020-05-30 LAB — URINALYSIS, COMPLETE
Bilirubin, UA: NEGATIVE
Glucose, UA: NEGATIVE
Leukocytes,UA: NEGATIVE
Nitrite, UA: NEGATIVE
Protein,UA: NEGATIVE
Specific Gravity, UA: 1.025 (ref 1.005–1.030)
Urobilinogen, Ur: 0.2 mg/dL (ref 0.2–1.0)
pH, UA: 5.5 (ref 5.0–7.5)

## 2020-05-30 LAB — MICROSCOPIC EXAMINATION: Bacteria, UA: NONE SEEN

## 2020-05-30 NOTE — Progress Notes (Signed)
05/30/2020 4:30 PM   [place] June 23, 1960 [id number]  CC: Chief Complaint  Patient presents with  . Nephrolithiasis   HPI: Sharon Wright is a 60 y.o. female with PMH nephrolithiasis, high risk gross hematuria, and atypical urine cytology with normal cystoscopy under anesthesia in June 2020 who underwent ESWL with Dr. Bernardo Heater on 04/17/2020 for management of a right renal pelvic stone who presents today for stone follow-up.  Today she reports resolution of her intermittent flank pain.  She underwent renal ultrasound yesterday which revealed stable fullness of the bilateral renal collecting systems compared to prior ultrasound.   In-office UA today positive for trace ketones and 1+ blood; urine microscopy with 3-10 RBCs/HPF.  PMH: Past Medical History:  Diagnosis Date  . Arthritis    hands, lower back  . Gross hematuria 06/29/2018  . History of kidney stones    still have 5 in right kidney  . Microscopic hematuria    hx of due to kidney stones  . Pelvic mass in female   . Pelvic mass in female 01/11/2018  . Tobacco abuse 03/22/2017    Surgical History: Past Surgical History:  Procedure Laterality Date  . back syugery  1993   lower lumbar  . Benign tumor  1993  . BUNIONECTOMY Left 2006  . COLONOSCOPY WITH PROPOFOL N/A 04/17/2019   Procedure: COLONOSCOPY WITH PROPOFOL;  Surgeon: Lin Landsman, MD;  Location: [facility];  Service: Gastroenterology;  Laterality: N/A;  . CYSTOSCOPY/URETEROSCOPY/HOLMIUM LASER/STENT PLACEMENT Right 08/11/2018   Procedure: CYSTOSCOPY/URETEROSCOPY/HOLMIUM LASER/STENT PLACEMENT;  Surgeon: Billey Co, MD;  Location: [facility];  Service: Urology;  Laterality: Right;  . EXTRACORPOREAL SHOCK WAVE LITHOTRIPSY Right 04/17/2020   Procedure: EXTRACORPOREAL SHOCK WAVE LITHOTRIPSY (ESWL);  Surgeon: Abbie Sons, MD;  Location: [facility];  Service: Urology;  Laterality: Right;  . LITHOTRIPSY    . POLYPECTOMY  04/17/2019    Procedure: POLYPECTOMY;  Surgeon: Lin Landsman, MD;  Location: [place];  Service: Gastroenterology;;  . ROBOTIC ASSISTED BILATERAL SALPINGO OOPHERECTOMY Bilateral 02/06/2018   Procedure: XI ROBOTIC ASSISTED BILATERAL SALPINGO OOPHORECTOMY , PELVIC WASHINGS;  Surgeon: Isabel Caprice, MD;  Location: [place];  Service: Gynecology;  Laterality: Bilateral;  . 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 No SOB, no hospitilization Has patient had a PCN reaction causing immediate rash, facial/tongue/throat swelling, SOB or lightheadedness with hypotension: No Has patient had a PCN reaction causing severe rash involving mucus membranes or skin necrosis: No Has patient had a PCN reaction that required hospitalization: No Has patient had a PCN reaction occurring within the last 10 years: No If all of the above answers are "NO", then may proceed with Cephalos      Medication List       Accurate as of May 30, 2020  4:30 PM. If you have any questions, ask your nurse or doctor.        acetaminophen 650 MG CR tablet Commonly known as: TYLENOL Take 650 mg by mouth every 8 (eight) hours as needed for pain.   cholecalciferol 1000 units tablet Commonly known as: VITAMIN D Take 1,000 Units by mouth daily.       Allergies:  Allergies  Allergen Reactions  . Oxycodone Nausea And  Vomiting  . Penicillins Rash and Other (See Comments)    Childhood allergy rash after 3 days. No SOB, no hospitilization Has patient had a PCN reaction causing immediate rash, facial/tongue/throat swelling, SOB or lightheadedness with hypotension: No Has patient had a PCN reaction causing severe rash involving mucus membranes or skin necrosis: No Has patient had a PCN reaction that required  hospitalization: No Has patient had a PCN reaction occurring within the last 10 years: No If all of the above answers are "NO", then may proceed with Cephalos    Family History: Family History  Problem Relation Age of Onset  . Congestive Heart Failure Mother   . Heart disease Mother   . Uterine cancer Mother   . Diabetes Father   . Heart disease Father   . Dementia Father   . Heart disease Sister   . Diabetes Sister     Social History:   reports that she has been smoking cigarettes. She has a 40.00 pack-year smoking history. She has never used smokeless tobacco. She reports that she does not drink alcohol and does not use drugs.  Physical Exam: BP (!) 141/95   Pulse 85   Ht 5\' 9"  (1.753 m)   Wt 200 lb (90.7 kg)   BMI 29.53 kg/m   Constitutional:  Alert and oriented, no acute distress, nontoxic appearing HEENT: [place], AT Cardiovascular: No clubbing, cyanosis, or edema Respiratory: Normal respiratory effort, no increased work of breathing Skin: No rashes, bruises or suspicious lesions Neurologic: Grossly intact, no focal deficits, moving all 4 extremities Psychiatric: Normal mood and affect  Laboratory Data: Results for orders placed or performed in visit on 05/30/20  Microscopic Examination   Urine  Result Value Ref Range   WBC, UA 0-5 0 - 5 /hpf   RBC 3-10 (A) 0 - 2 /hpf   Epithelial Cells (non renal) 0-10 0 - 10 /hpf   Bacteria, UA None seen None seen/Few  Urinalysis, Complete  Result Value Ref Range   Specific Gravity, UA 1.025 1.005 - 1.030   pH, UA 5.5 5.0 - 7.5   Color, UA Yellow Yellow   Appearance Ur Hazy (A) Clear   Leukocytes,UA Negative Negative   Protein,UA Negative Negative/Trace   Glucose, UA Negative Negative   Ketones, UA Trace (A) Negative   RBC, UA 1+ (A) Negative   Bilirubin, UA Negative Negative   Urobilinogen, Ur 0.2 0.2 - 1.0 mg/dL   Nitrite, UA Negative Negative   Microscopic Examination See below:    Pertinent Imaging: Results for  orders placed during the hospital encounter of 05/29/20  US RENAL  Narrative CLINICAL DATA:  Status post ESWL  EXAM: RENAL / URINARY TRACT ULTRASOUND COMPLETE  COMPARISON:  02/06/2020  FINDINGS: Right Kidney:  Renal measurements: 11.9 x 5.0 x 5.6 cm = volume: 176 mL. Echogenicity within normal limits. No mass. Mild hydronephrosis.  Left Kidney:  Renal measurements: 10.5 x 6.2 x 5.7 cm = volume: 193 mL. Echogenicity within normal limits. No mass. Mild hydronephrosis.  Bladder:  Appears normal for degree of bladder distention.  Other:  Diffuse increased echogenicity of the visualized portions of the hepatic parenchyma are a nonspecific indicator of hepatocellular dysfunction, most commonly steatosis.  IMPRESSION: Mild bilateral hydronephrosis, similar to prior exam.   Electronically Signed By: Miachel Roux M.D. On: 05/29/2020 09:29  I personally reviewed the images referenced above and note stable fullness of the bilateral collecting systems.  Assessment & Plan:   1. Nephrolithiasis Stable fullness of the  bilateral collecting systems on renal ultrasound with bilateral ureteral jets noted and no evidence of persistent urinary obstruction. - US RENAL; Future  2. Microscopic hematuria Persistent on UA today consistent with baseline.  Recommend annual follow-up given her high risk status.  We discussed the need for repeat CT hematuria and cystoscopy every 3 to 5 years, sooner if she develops gross hematuria or painful urination.  She expressed understanding. - Urinalysis, Complete  Return in about 1 year (around 05/30/2021) for Annual f/u with Dr. Bernardo Heater with UA and RUS prior.  Debroah Loop, PA-C  [facility] [address] [place], [place] [address] [phone]

## 2020-11-28 IMAGING — CR DG CERVICAL SPINE COMPLETE 4+V
1 series · 7 of 7 positions shown · non-contrast
Comparison: No prior.

CLINICAL DATA: Neck pain.  Radicular pain down left arm.

EXAM:
CERVICAL SPINE - COMPLETE 4+ VIEW

[Series 1: dg cervical spine complete · 0.14mm/px · 7 of 7 slices shown]
[im 1/7]
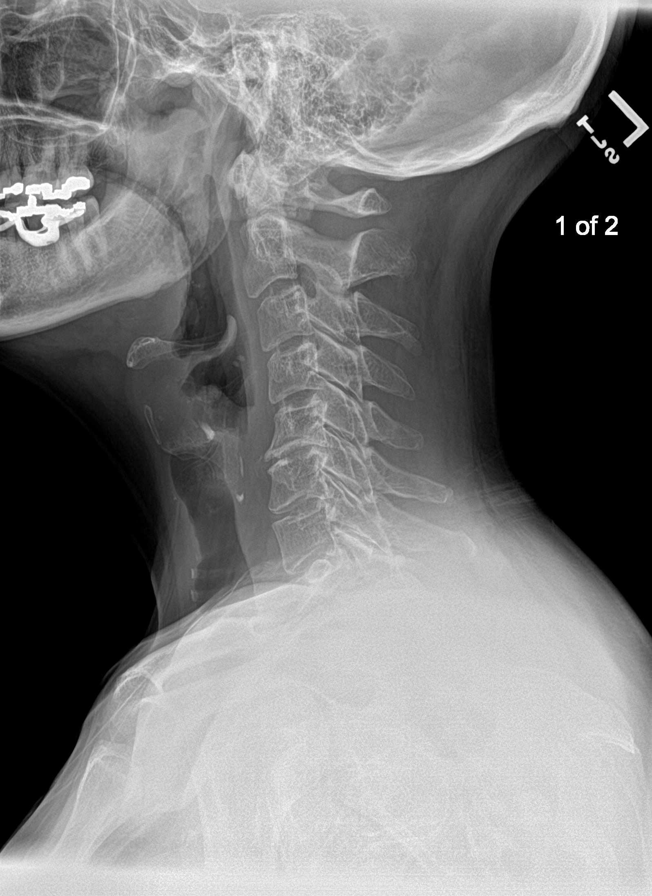
[im 2/7]
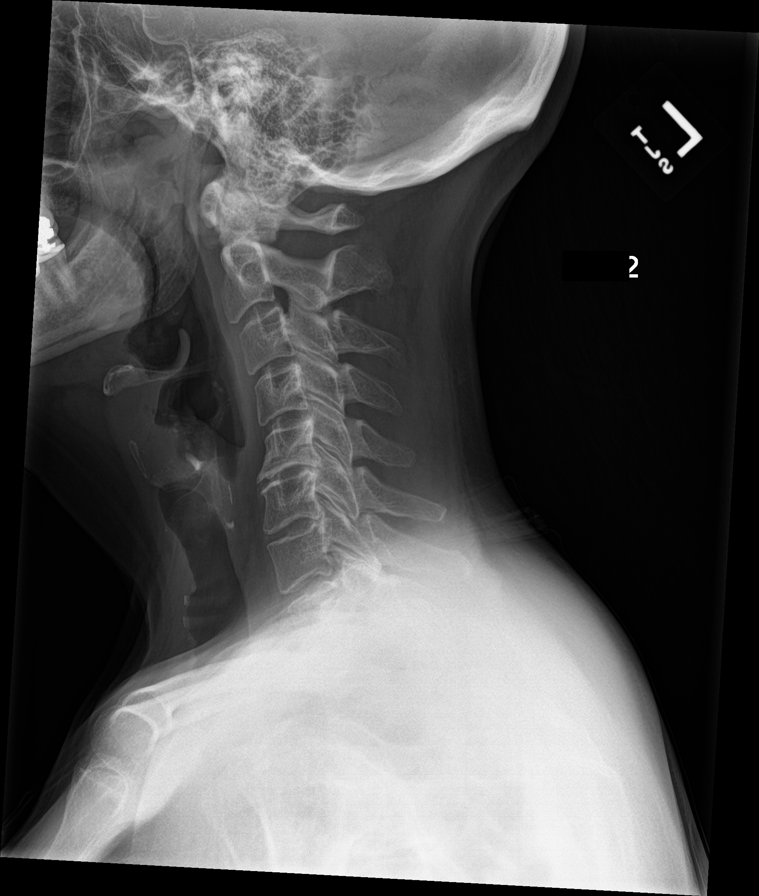
[im 3/7]
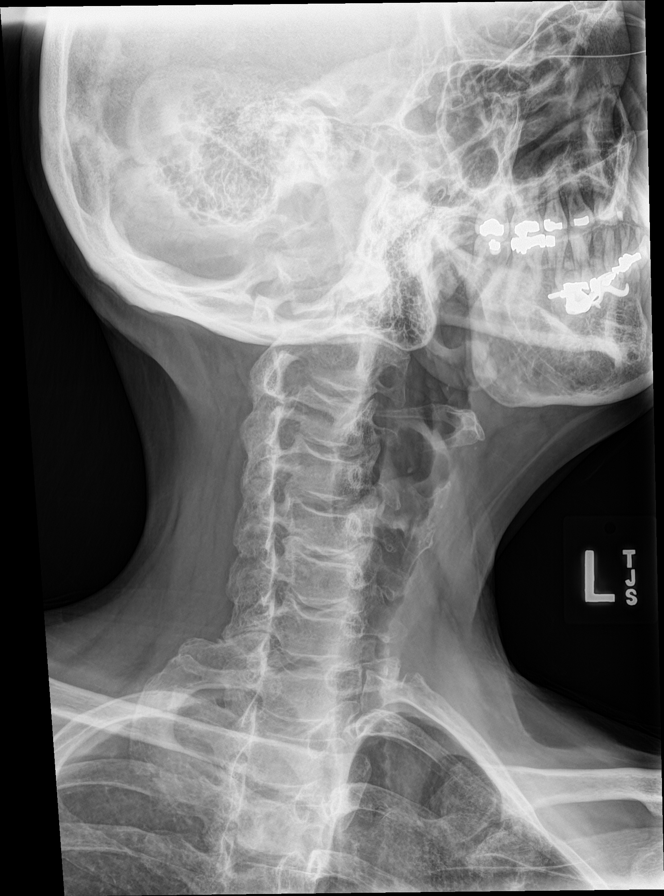
[im 4/7]
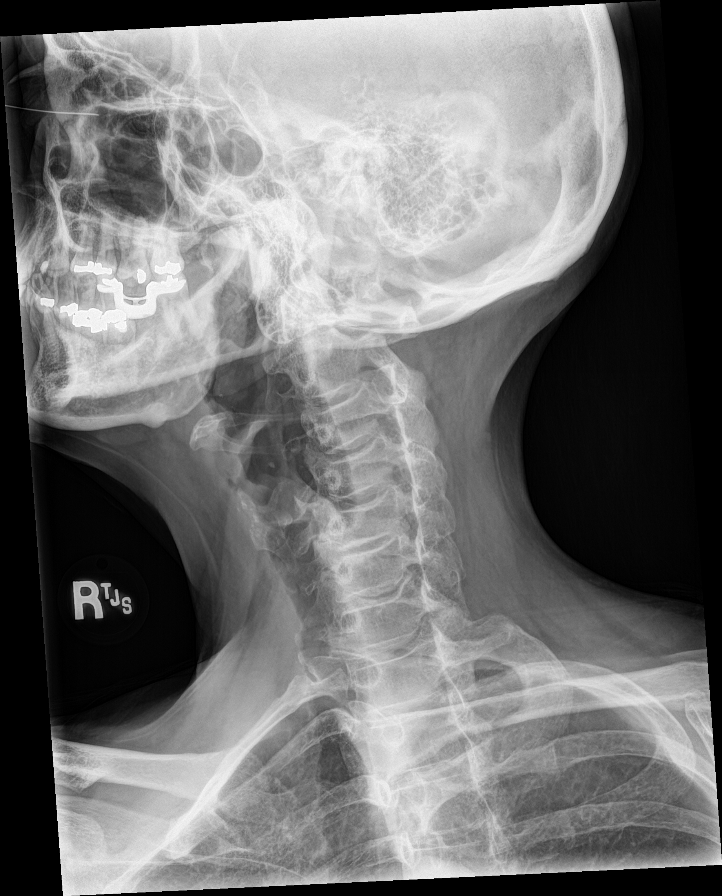
[im 5/7]
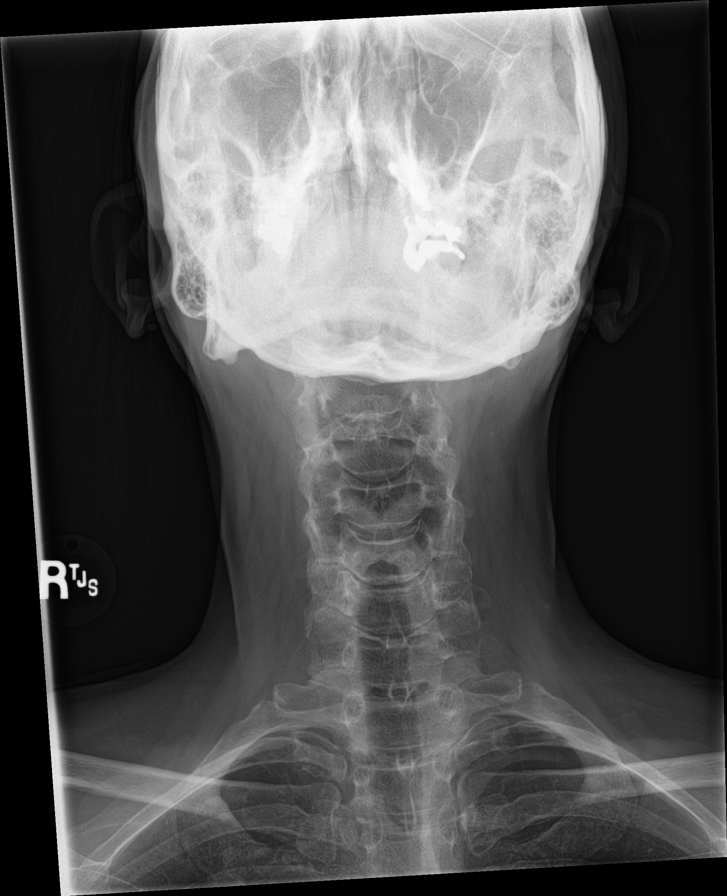
[im 6/7]
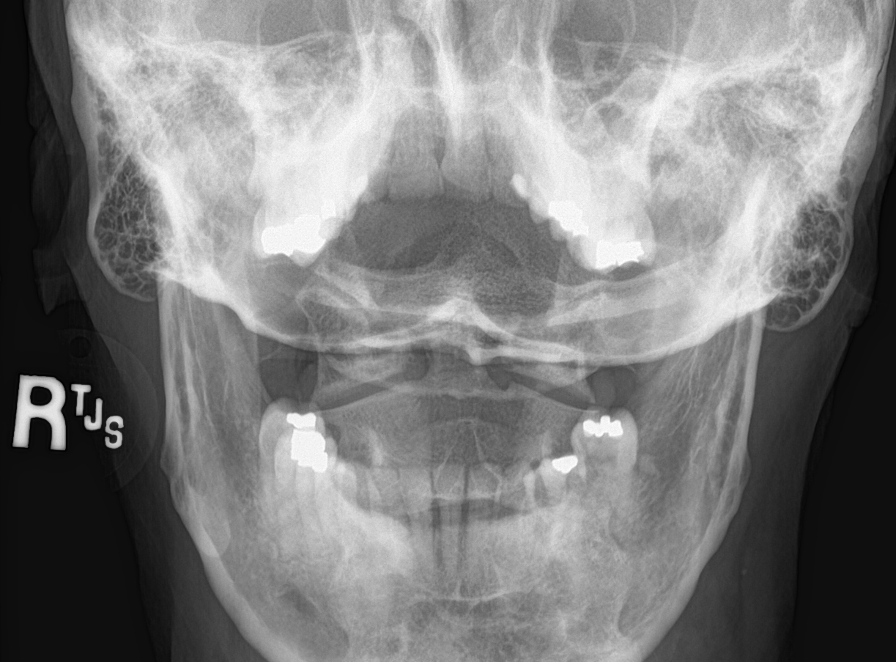
[im 7/7]
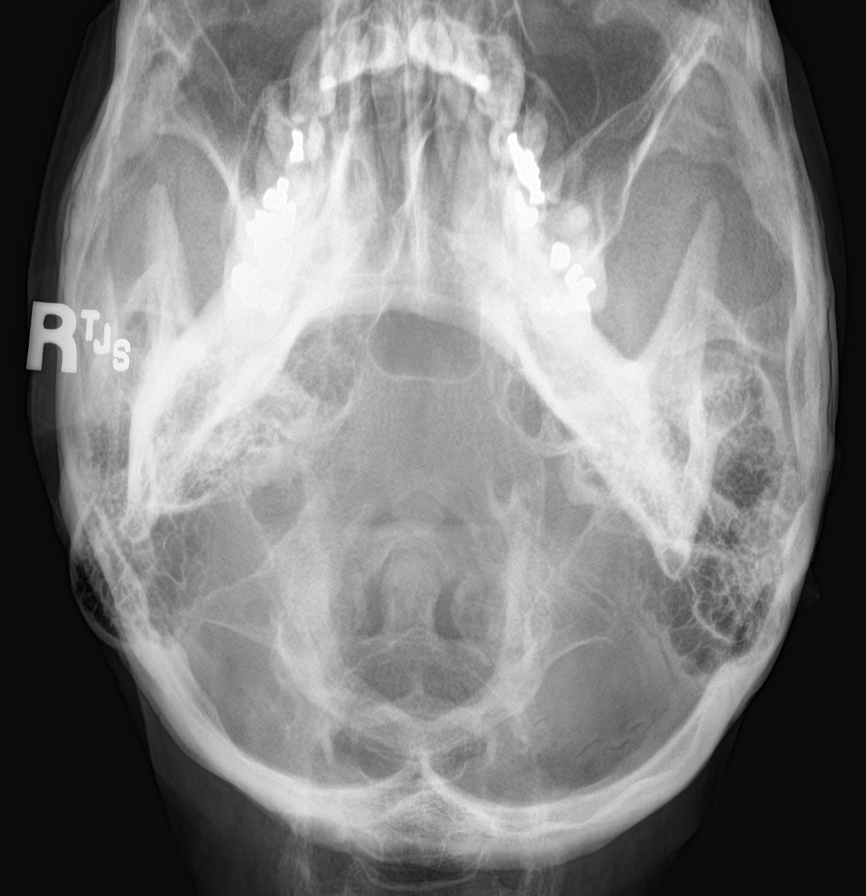

[7 of 7 positions shown; findings below may reference images not displayed]

FINDINGS: Loss of normal cervical lordosis. Disc degeneration endplate
osteophyte formation noted C5-C6. Diffuse facet hypertrophy with
mild multilevel bilateral neural foraminal narrowing. Diffuse
osteopenia. No acute bony abnormality. Pulmonary apices are clear.
IMPRESSION: 1. Loss of normal cervical lordosis. Degenerative changes C5-C6.
Diffuse facet hypertrophy with mild multilevel bilateral neural
foraminal narrowing.

2.  Diffuse osteopenia.  No acute abnormality identified.

## 2021-03-30 ENCOUNTER — Ambulatory Visit
Admission: RE | Admit: 2021-03-30 | Discharge: 2021-03-30 | Disposition: A | Discharge status: Home / Self Care | Payer: 59 | Attending: Physician Assistant | Admitting: Physician Assistant

## 2021-03-30 ENCOUNTER — Encounter: Payer: Self-pay | Admitting: Physician Assistant

## 2021-03-30 ENCOUNTER — Ambulatory Visit
Admission: RE | Admit: 2021-03-30 | Discharge: 2021-03-30 | Disposition: A | Discharge status: Home / Self Care | Payer: 59 | Source: Ambulatory Visit | Attending: Physician Assistant | Admitting: Physician Assistant

## 2021-03-30 ENCOUNTER — Ambulatory Visit (INDEPENDENT_AMBULATORY_CARE_PROVIDER_SITE_OTHER): Payer: 59 | Admitting: Physician Assistant

## 2021-03-30 ENCOUNTER — Other Ambulatory Visit: Payer: Self-pay

## 2021-03-30 VITALS — BP 116/68 | HR 97 | Temp 97.7°F | Resp 16 | Ht 69.0 in | Wt 177.7 lb

## 2021-03-30 DIAGNOSIS — R059 Cough, unspecified: Secondary | ICD-10-CM | POA: Diagnosis not present

## 2021-03-30 LAB — POCT INFLUENZA A/B
Influenza A, POC: NEGATIVE
Influenza B, POC: NEGATIVE

## 2021-03-30 MED ORDER — BENZONATATE 100 MG PO CAPS: 100.0000 mg | ORAL_CAPSULE | Freq: Two times a day (BID) | ORAL | 0 refills | Status: DC | PRN

## 2021-03-30 NOTE — Patient Instructions (Signed)
Based on your described symptoms and the duration of symptoms it is likely that you have a viral upper respiratory infection (often called a "cold")  Symptoms can last for 3-10 days with lingering cough and intermittent symptoms lasting weeks after that.  The goal of treatment at this time is to reduce your symptoms and discomfort   I have sent in Tessalon pearls for you to take twice per day to help with your cough  You can use over the counter medications such as Dayquil/Nyquil, AlkaSeltzer formulations, etc to provide further relief of symptoms according to the manufacturer's instructions  If preferred you can use Coricidin to manage your symptoms rather than those medications mentioned above.  Please stay well hydrated to help improve movement of secretions  Try to rest as much as possible You can use saline nasal sprays and humidifiers at night to help keep your nose from feeling irritated.    If your symptoms do not improve or become worse in the next 5-7 days please make an apt at the office so we can see you  Go to the ER if you begin to have more serious symptoms such as shortness of breath, trouble breathing, loss of consciousness, swelling around the eyes, high fever, severe lasting headaches, vision changes or neck pain/stiffness.

## 2021-03-30 NOTE — Progress Notes (Signed)
Acute Office Visit  Subjective:    Patient ID: Sharon Wright, female    DOB: 1961/02/20, 61 y.o.   MRN: [id number]  Chief Complaint  Patient presents with   Cough    Pt complains at night chest congestion, coughing up phelgm.   Wheezing    Pt was diagnosed pneumonia on 03/13/21    HPI Patient is in today for complaints of congestion, productive cough States she was seen at [facility] urgent care in [place], [place]  and was diagnosed with pneumonia on 03/13/2021 She was given Cefdinir on 03/07/2021, on 03/13/2021 she was given a zpack  Reports her symptoms have not resolved since that diagnosis  States she is still experiencing sore throat, ear pain, productive cough States she was tested each time she went to [facility] and was negative for COVID Reports she is still smoking  States she is using the albuterol inhaler sparingly but it does provide a bit of relief for breathing and coughing   Past Medical History:  Diagnosis Date   Arthritis    hands, lower back   Gross hematuria 06/29/2018   History of kidney stones    still have 5 in right kidney   Microscopic hematuria    hx of due to kidney stones   Pelvic mass in female    Pelvic mass in female 01/11/2018   Tobacco abuse 03/22/2017    Past Surgical History:  Procedure Laterality Date   back syugery  1993   lower lumbar   Benign tumor  1993   BUNIONECTOMY Left 2006   COLONOSCOPY WITH PROPOFOL N/A 04/17/2019   Procedure: COLONOSCOPY WITH PROPOFOL;  Surgeon: Lin Landsman, MD;  Location: [place];  Service: Gastroenterology;  Laterality: N/A;   CYSTOSCOPY/URETEROSCOPY/HOLMIUM LASER/STENT PLACEMENT Right 08/11/2018   Procedure: CYSTOSCOPY/URETEROSCOPY/HOLMIUM LASER/STENT PLACEMENT;  Surgeon: Billey Co, MD;  Location: [facility];  Service: Urology;  Laterality: Right;   EXTRACORPOREAL SHOCK WAVE LITHOTRIPSY Right 04/17/2020   Procedure: EXTRACORPOREAL SHOCK WAVE LITHOTRIPSY (ESWL);  Surgeon: Abbie Sons,  MD;  Location: [facility];  Service: Urology;  Laterality: Right;   LITHOTRIPSY     POLYPECTOMY  04/17/2019   Procedure: POLYPECTOMY;  Surgeon: Lin Landsman, MD;  Location: [place];  Service: Gastroenterology;;   ROBOTIC ASSISTED BILATERAL SALPINGO OOPHERECTOMY Bilateral 02/06/2018   Procedure: XI ROBOTIC ASSISTED BILATERAL SALPINGO OOPHORECTOMY , PELVIC WASHINGS;  Surgeon: Isabel Caprice, MD;  Location: [place];  Service: Gynecology;  Laterality: Bilateral;   SKIN SURGERY  2008   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    Family History  Problem Relation Age of Onset   Congestive Heart Failure Mother    Heart disease Mother    Uterine cancer Mother    Diabetes Father    Heart disease Father    Dementia Father    Heart disease Sister    Diabetes Sister     Social History   Socioeconomic History   Marital status: Married    Spouse name: Carloyn Manner   Number of children: 2   Years of education: Not on file   Highest education level: Associate degree: occupational, Hotel manager, or vocational program  Occupational History   Not on file  Tobacco Use   Smoking status: Every Day    Packs/day: 1.00    Years: 40.00    Pack years: 40.00    Types: Cigarettes   Smokeless tobacco: Never   Tobacco comments:    since age 60  Vaping  Use   Vaping Use: Never used  Substance and Sexual Activity   Alcohol use: No   Drug use: No   Sexual activity: Yes    Partners: Male    Birth control/protection: Post-menopausal  Other Topics Concern   Not on file  Social History Narrative   Not on file   Social Determinants of Health   Financial Resource Strain: Not on file  Food Insecurity: Not on file  Transportation Needs: Not on file  Physical Activity: Not on file  Stress: Not on file  Social Connections: Not on file  Intimate Partner Violence: Not on file    Outpatient Medications Prior to Visit  Medication Sig Dispense Refill   acetaminophen (TYLENOL) 650 MG CR  tablet Take 650 mg by mouth every 8 (eight) hours as needed for pain.     albuterol (VENTOLIN HFA) 108 (90 Base) MCG/ACT inhaler SMARTSIG:2 Puff(s) Via Inhaler 4 Times Daily PRN     cholecalciferol (VITAMIN D) 1000 UNITS tablet Take 1,000 Units by mouth daily.      promethazine-dextromethorphan (PROMETHAZINE-DM) 6.25-15 MG/5ML syrup Take 5 mLs by mouth 4 (four) times daily as needed.     No facility-administered medications prior to visit.    Allergies  Allergen Reactions   Oxycodone Nausea And Vomiting   Penicillins Rash and Other (See Comments)    Childhood allergy rash after 3 days. No SOB, no hospitilization Has patient had a PCN reaction causing immediate rash, facial/tongue/throat swelling, SOB or lightheadedness with hypotension: No Has patient had a PCN reaction causing severe rash involving mucus membranes or skin necrosis: No Has patient had a PCN reaction that required hospitalization: No Has patient had a PCN reaction occurring within the last 10 years: No If all of the above answers are "NO", then may proceed with Cephalos    Review of Systems  Constitutional:  Positive for fatigue. Negative for fever.  HENT:  Positive for congestion, ear pain and sore throat. Negative for sinus pressure and sinus pain.   Respiratory:  Positive for cough (productive cough).   Cardiovascular:  Negative for chest pain.  Gastrointestinal:  Positive for nausea. Negative for diarrhea and vomiting.  Musculoskeletal:  Negative for arthralgias and myalgias.  Neurological:  Positive for dizziness. Negative for light-headedness and headaches.  Hematological:  Positive for adenopathy.      Objective:    Physical Exam Vitals reviewed.  Constitutional:      General: She is awake.     Appearance: Normal appearance. She is well-developed, well-groomed and overweight.  HENT:     Head: Normocephalic and atraumatic.     Right Ear: Hearing, tympanic membrane, ear canal and external ear normal.      Left Ear: Hearing, tympanic membrane and ear canal normal.     Nose: Congestion present.     Right Turbinates: Not enlarged or swollen.     Left Turbinates: Not enlarged or swollen.     Right Sinus: No maxillary sinus tenderness or frontal sinus tenderness.     Left Sinus: No maxillary sinus tenderness or frontal sinus tenderness.     Mouth/Throat:     Mouth: Mucous membranes are moist.     Pharynx: Oropharynx is clear. Uvula midline. No oropharyngeal exudate or posterior oropharyngeal erythema.  Cardiovascular:     Rate and Rhythm: Normal rate and regular rhythm.     Pulses: Normal pulses.     Heart sounds: Normal heart sounds.  Pulmonary:     Effort: Pulmonary effort is normal.  Breath sounds: Decreased air movement present. Decreased breath sounds present. No wheezing, rhonchi or rales.  Abdominal:     General: Abdomen is flat. Bowel sounds are normal.  Musculoskeletal:     Cervical back: Normal range of motion and neck supple.  Lymphadenopathy:     Head:     Right side of head: No submental or submandibular adenopathy.     Left side of head: No submental or submandibular adenopathy.     Upper Body:     Right upper body: No supraclavicular adenopathy.     Left upper body: No supraclavicular adenopathy.  Neurological:     Mental Status: She is alert.  Psychiatric:        Behavior: Behavior is cooperative.    BP 116/68    Pulse 97    Temp 97.7 F (36.5 C) (Oral)    Resp 16    Ht 5\' 9"  (1.753 m)    Wt 177 lb 11.2 oz (80.6 kg)    SpO2 97%    BMI 26.24 kg/m  Wt Readings from Last 3 Encounters:  03/30/21 177 lb 11.2 oz (80.6 kg)  05/30/20 200 lb (90.7 kg)  05/02/20 200 lb (90.7 kg)    Health Maintenance Due  Topic Date Due   HIV Screening  Never done   MAMMOGRAM  Never done   Hepatitis C Screening  Never done   Zoster Vaccines- Shingrix (1 of 2) Never done   PAP SMEAR-Modifier  08/24/2020    There are no preventive care reminders to display for this patient.   No  results found for: TSH Lab Results  Component Value Date   WBC 9.6 01/22/2019   HGB 15.7 (H) 01/22/2019   HCT 44.7 01/22/2019   MCV 86.5 01/22/2019   PLT 241 01/22/2019   Lab Results  Component Value Date   NA 140 01/22/2019   K 3.8 01/22/2019   CO2 24 01/22/2019   GLUCOSE 101 (H) 01/22/2019   BUN 20 01/22/2019   CREATININE 0.46 01/22/2019   BILITOT 0.4 01/22/2019   ALKPHOS 71 01/22/2019   AST 39 01/22/2019   ALT 44 01/22/2019   PROT 6.8 01/22/2019   ALBUMIN 3.9 01/22/2019   CALCIUM 9.1 01/22/2019   ANIONGAP 9 01/22/2019   No results found for: CHOL No results found for: HDL No results found for: LDLCALC No results found for: TRIG No results found for: CHOLHDL No results found for: HGBA1C     Assessment & Plan:   Problem List Items Addressed This Visit   None Visit Diagnoses     Cough, unspecified type    -  Primary Acute, new problem, not improving since 03/13/2021 Patient reports she was diagnosed with pneumonia on 03/13/2021 at [facility] and was given Cefdinir and z pack for symptoms Reports she has not had resolution of symptoms despite these rounds of abx Recommend chest x ray to determine if there are signs of lingering pneumonia Unsure if she potentially has undiagnosed COPD and is having acute exacerbation due to smoking history and chronicity of symptoms vs acute pneumonia  Provided Tessalon pearls today to assist with coughing and recommend continued use of inhaler to assist with breathing Will provide Abx if indicated by imaging  Potential referral to Pulmonology for assessment if symptoms are not improving       Relevant Orders   Novel Coronavirus, NAA ([facility])   POCT Influenza A/B        Meds ordered this encounter  Medications  benzonatate (TESSALON) 100 MG capsule    Sig: Take 1 capsule (100 mg total) by mouth 2 (two) times daily as needed for cough.    Dispense:  20 capsule    Refill:  0   The entirety of the information documented in the  History of Present Illness, Review of Systems and Physical Exam were personally obtained by me. Portions of this information were initially documented by the CMA and reviewed by me for thoroughness and accuracy.   Nakai Yard E Meriam Chojnowski, PA-C   Taber Sweetser E Ronie Barnhart, PA-C

## 2021-03-31 NOTE — Progress Notes (Signed)
No acute changes are noted on chest x ray. She does not appear to have current pneumonia

## 2021-04-01 LAB — SARS-COV-2, NAA 2 DAY TAT

## 2021-04-01 LAB — NOVEL CORONAVIRUS, NAA: SARS-CoV-2, NAA: NOT DETECTED

## 2021-04-26 IMAGING — CR DG ABDOMEN 1V
1 series · 2 of 2 positions shown · non-contrast
Comparison: 05/01/2018

CLINICAL DATA: History of nephrolithiasis, left flank pain and
hematuria

EXAM:
ABDOMEN - 1 VIEW

[Series 1: dg abd 1 view · 0.14mm/px · 2 of 2 slices shown]
[im 1/2]
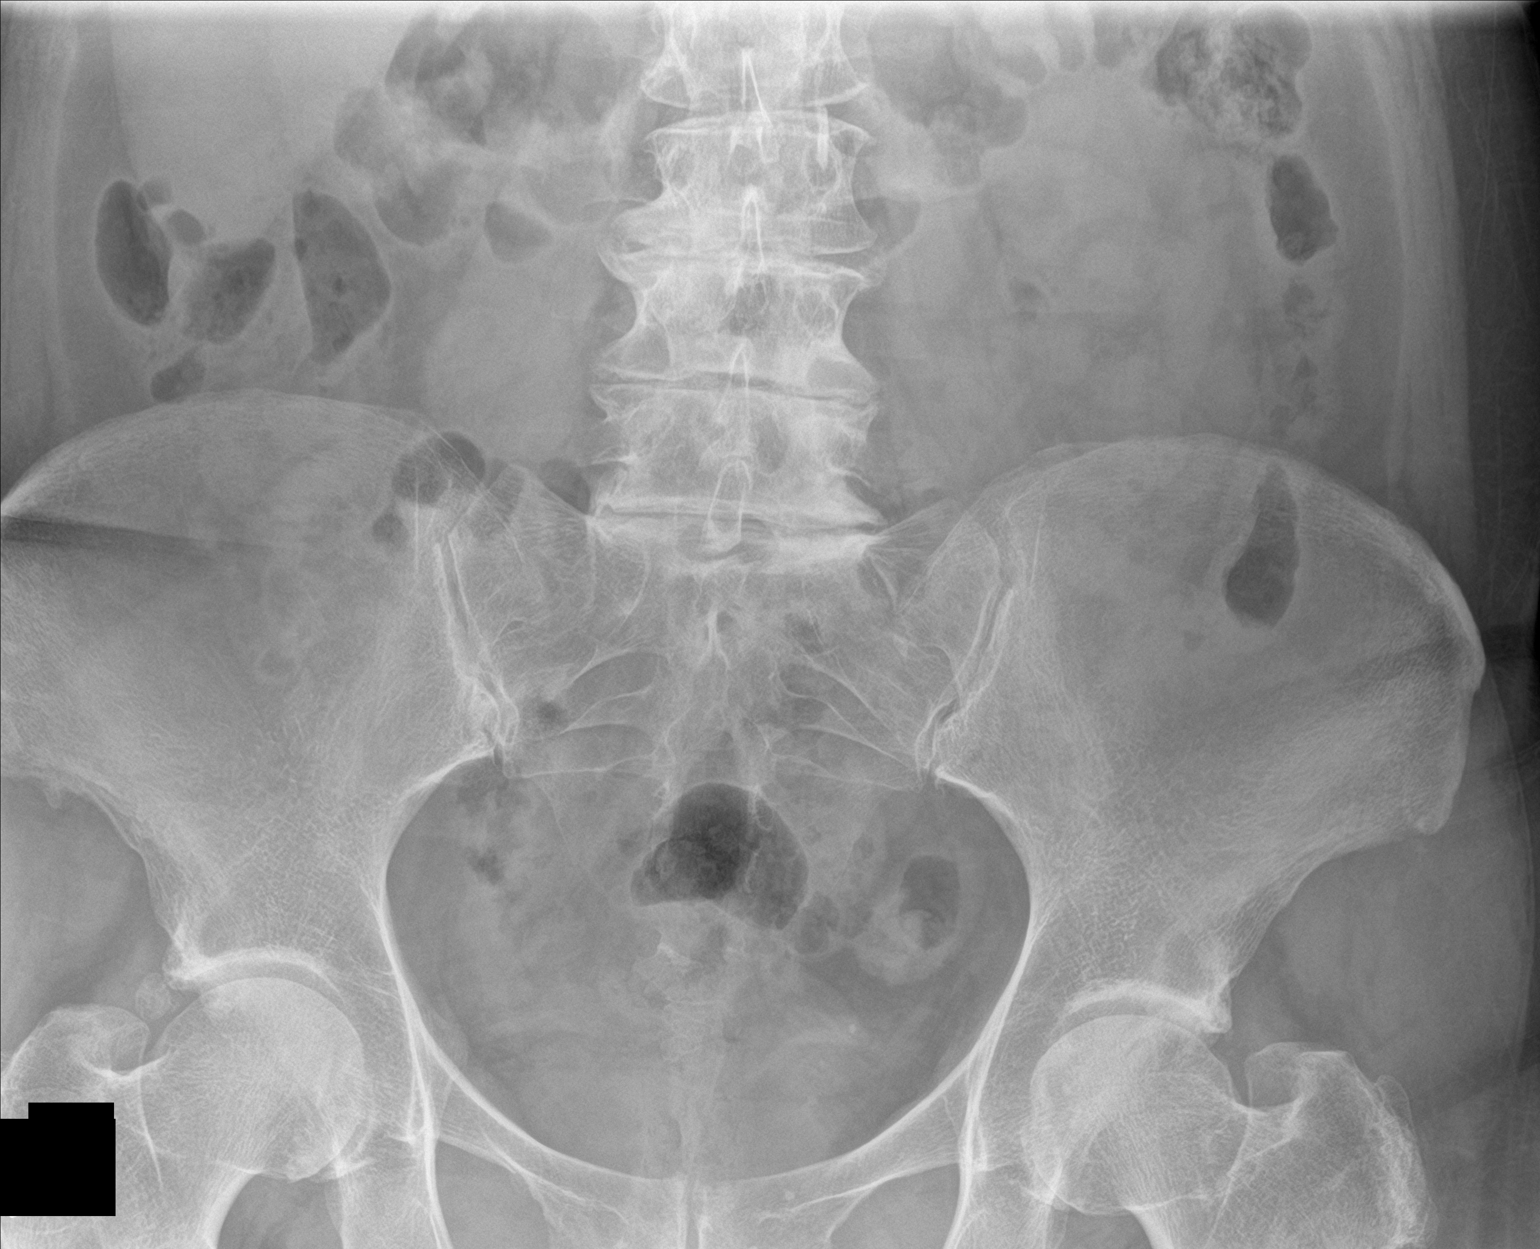
[im 2/2]
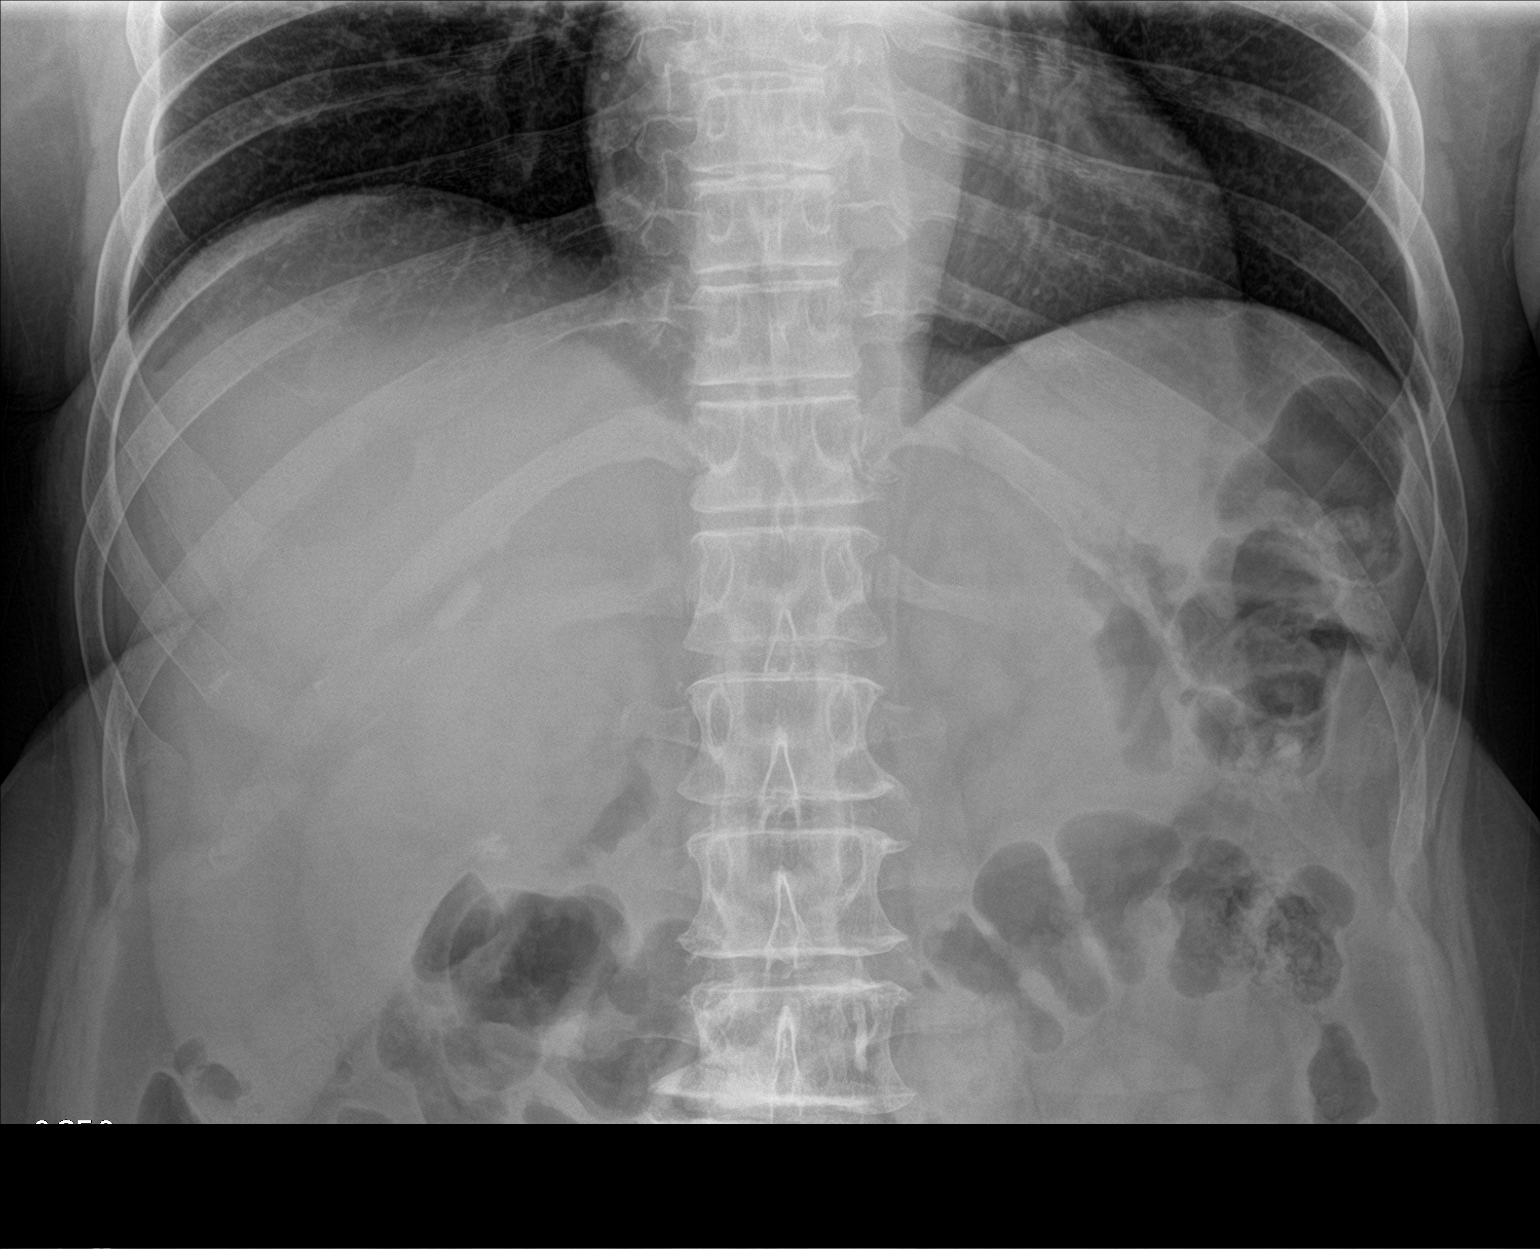

[2 of 2 positions shown; findings below may reference images not displayed]

FINDINGS: Clear lung bases. Nonobstructive bowel gas pattern. Bowel gas
pattern obscures the renal shadows. Radiopaque 1 cm calculus
projects over the right kidney midpole. Degenerative changes noted
spine from L3-S1. No acute osseous finding.
IMPRESSION: Query right nephrolithiasis by plain radiography

## 2021-05-28 ENCOUNTER — Ambulatory Visit: Payer: 59

## 2021-06-01 ENCOUNTER — Ambulatory Visit: Payer: Self-pay | Admitting: Urology

## 2021-06-30 IMAGING — CR DG ABDOMEN 1V
1 series · 2 of 2 positions shown · non-contrast
Comparison: April 17, 2020

CLINICAL DATA: Right renal stone.

EXAM:
ABDOMEN - 1 VIEW

[Series 1: dg abd 1 view · 0.14mm/px · 2 of 2 slices shown]
[im 1/2]
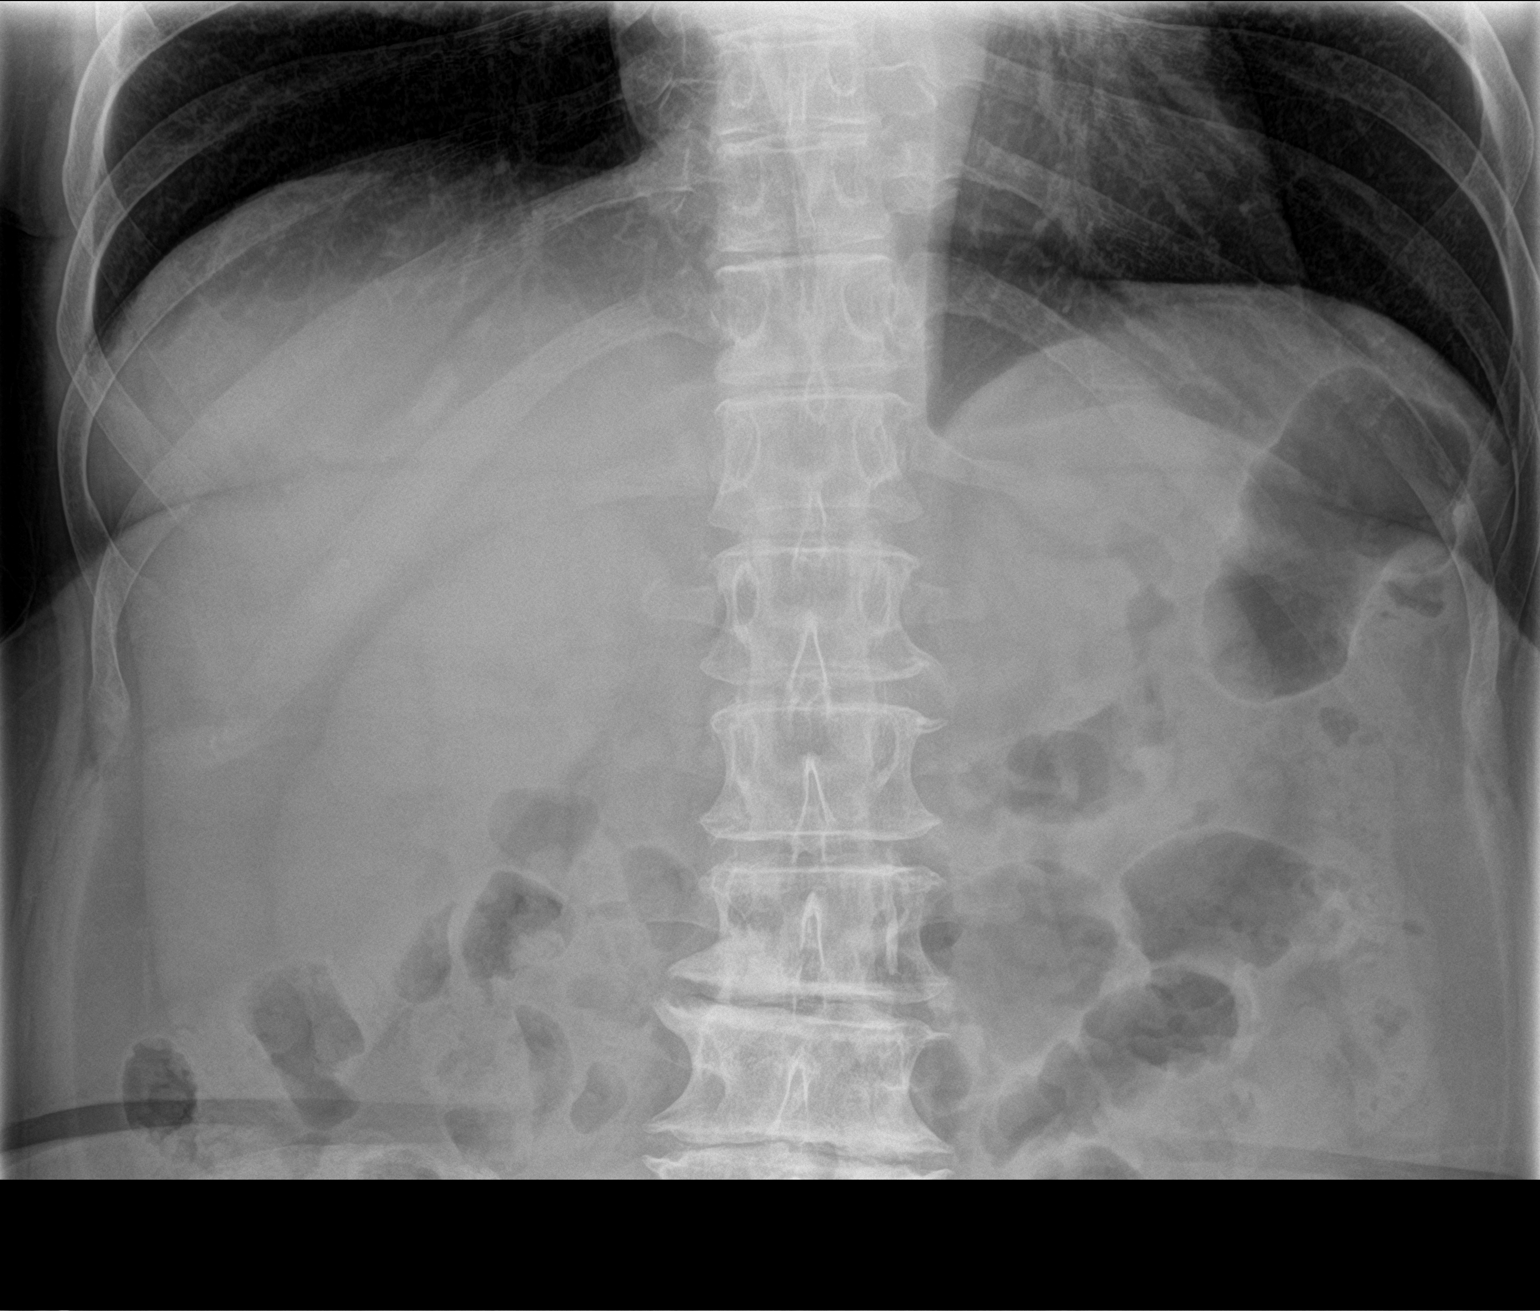
[im 2/2]
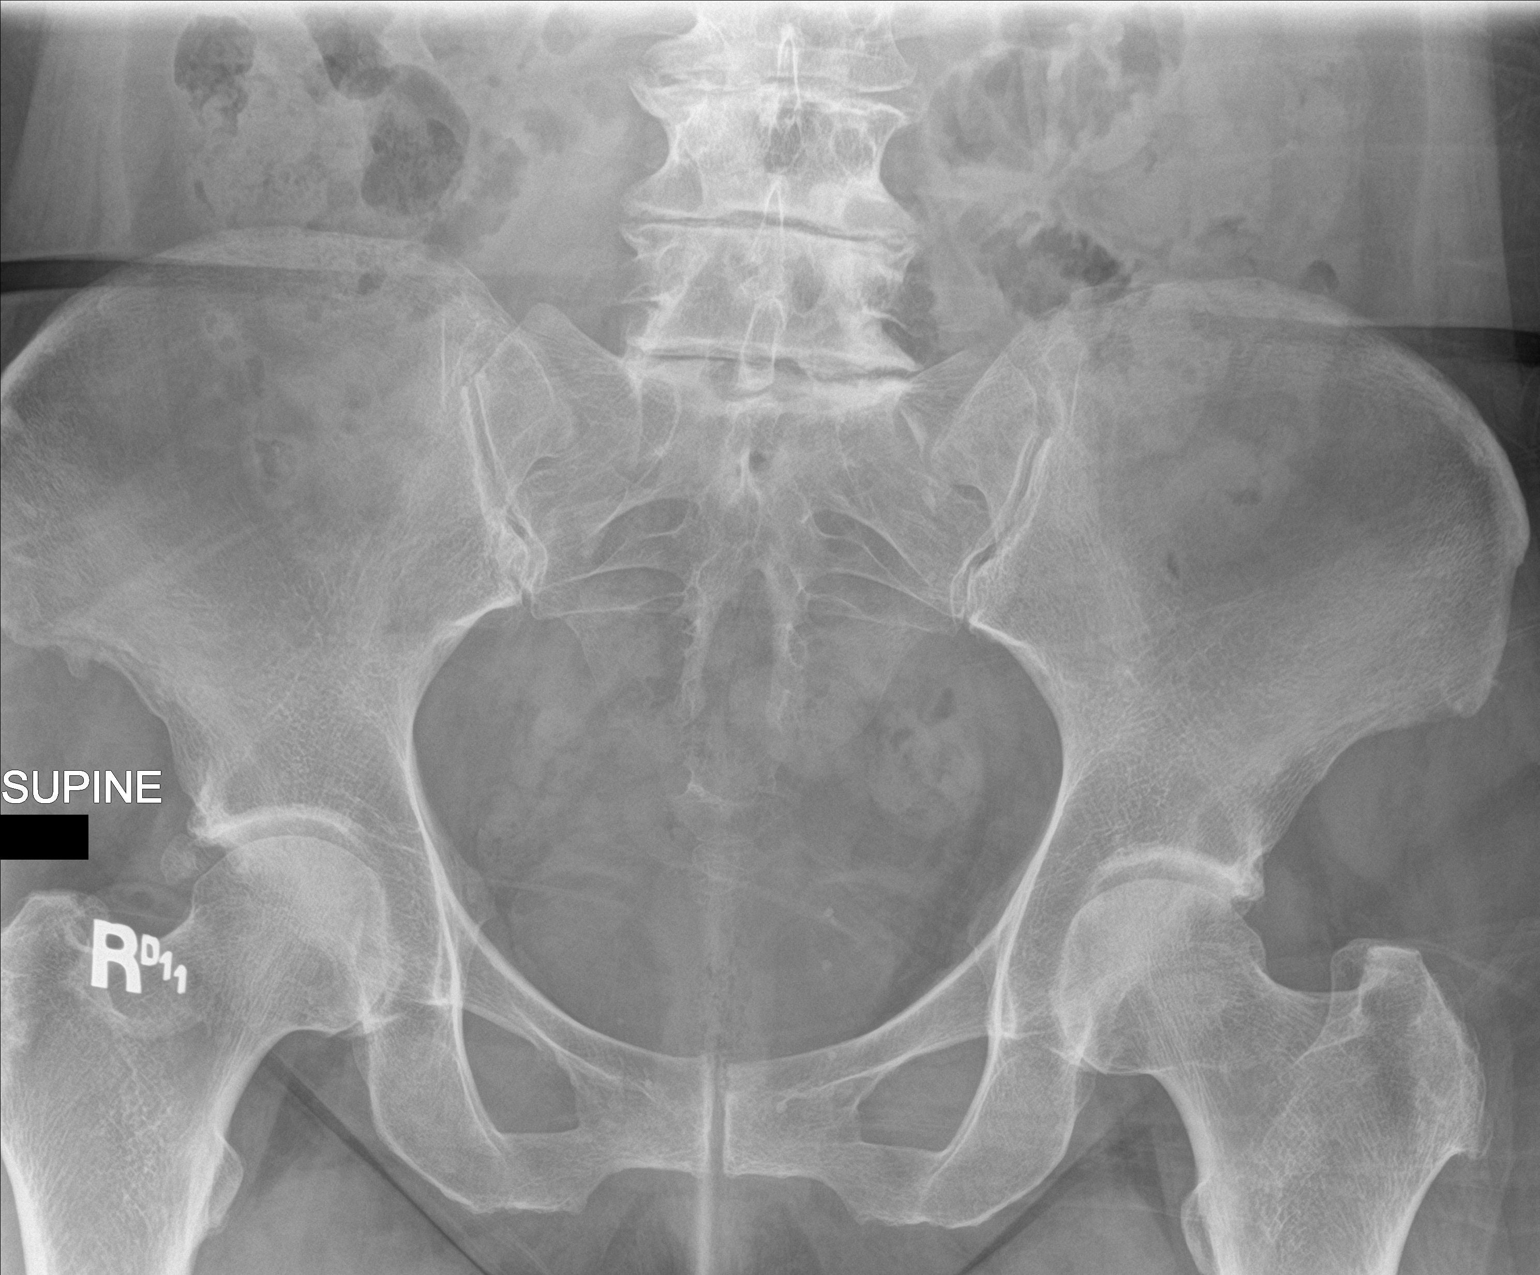

[2 of 2 positions shown; findings below may reference images not displayed]

FINDINGS: Left kidney is largely obscured by bowel contents. The previously
identified right renal stone is no longer visualized in the right
kidney. No ureteral stones identified. Phleboliths seen in the left
pelvis.
IMPRESSION: The previously seen right renal stone is no longer visualized. No
renal or ureteral stones noted today.

## 2021-08-31 ENCOUNTER — Ambulatory Visit: Payer: Self-pay | Admitting: *Deleted

## 2021-08-31 NOTE — Telephone Encounter (Signed)
Called pt and made her an appt after the  holiday and also set her up with a pcp provider of [facility]

## 2021-08-31 NOTE — Telephone Encounter (Signed)
  Chief Complaint: elevated BP, headache  Symptoms: BP 183/118 pulse 65 using wrist BP automatic monitor while sitting. Rechecked for BP 173/106 pulse 63 in left wrist . C/o headache.  Frequency: today  Pertinent Negatives: Patient denies chest pain no difficulty breathing no weakness on either side of body. No blurred vision. No hx hypertension  Disposition: '[x]'$ ED /'[]'$ Urgent Care (no appt availability in office) / '[]'$ Appointment(In office/virtual)/ '[]'$  [facility]/ '[]'$ Home Care/ '[x]'$ Refused Recommended Disposition /'[]'$ [facility]/ '[]'$  Follow-up with PCP Additional Notes:   Recommended ED due to new hypertension with HA. No other sx at this time. Last visit in office 1 year or more. Last seen by E. Mecum, PA per patient for cough 03/20/21.  Declined to go to ED for evaluation. Pt reports she will come in office  after holiday . [facility] notified of refusal to ED and will contact patient back to set up appt with a provider.    Reason for Disposition  Systolic BP  >= 142 OR Diastolic >= 395  Answer Assessment - Initial Assessment Questions 1. BLOOD PRESSURE: "What is the blood pressure?" "Did you take at least two measurements 5 minutes apart?"     BP 183/118  pulse 65 right wrist  and then BP 173/106 pulse 63 left wrist  2. ONSET: "When did you take your blood pressure?"     Now  3. HOW: "How did you obtain the blood pressure?" (e.g., visiting nurse, automatic home BP monitor)     Automatic home BP monitor wrist monitor  4. HISTORY: "Do you have a history of high blood pressure?"     No  5. MEDICATIONS: "Are you taking any medications for blood pressure?" "Have you missed any doses recently?"     no 6. OTHER SYMPTOMS: "Do you have any symptoms?" (e.g., headache, chest pain, blurred vision, difficulty breathing, weakness)     Headache ,  7. PREGNANCY: "Is there any chance you are pregnant?" "When was your last menstrual period?"     na  Protocols used: Blood Pressure -  High-A-AH

## 2021-09-04 ENCOUNTER — Encounter: Payer: Self-pay | Admitting: Internal Medicine

## 2021-09-04 ENCOUNTER — Ambulatory Visit (INDEPENDENT_AMBULATORY_CARE_PROVIDER_SITE_OTHER): Payer: 59 | Admitting: Internal Medicine

## 2021-09-04 VITALS — BP 116/78 | HR 91 | Temp 97.6°F | Resp 16 | Ht 69.0 in | Wt 189.5 lb

## 2021-09-04 DIAGNOSIS — Z1159 Encounter for screening for other viral diseases: Secondary | ICD-10-CM

## 2021-09-04 DIAGNOSIS — Z13220 Encounter for screening for lipoid disorders: Secondary | ICD-10-CM | POA: Diagnosis not present

## 2021-09-04 DIAGNOSIS — F439 Reaction to severe stress, unspecified: Secondary | ICD-10-CM | POA: Diagnosis not present

## 2021-09-04 DIAGNOSIS — R030 Elevated blood-pressure reading, without diagnosis of hypertension: Secondary | ICD-10-CM | POA: Diagnosis not present

## 2021-09-04 DIAGNOSIS — Z114 Encounter for screening for human immunodeficiency virus [HIV]: Secondary | ICD-10-CM

## 2021-09-04 MED ORDER — ESCITALOPRAM OXALATE 5 MG PO TABS: 5.0000 mg | ORAL_TABLET | Freq: Every day | ORAL | 1 refills | Status: DC

## 2021-09-04 NOTE — Progress Notes (Signed)
Established Patient Office Visit  Subjective   Patient ID: Sharon Wright, female    DOB: 09-27-1960  Age: 61 y.o. MRN: [id number]  Chief Complaint  Patient presents with   Hypertension    Never been diagnosed but running high at home w/ some headaches.  States under a lot of stress due to husbands health concerns    HPI Sharon Wright is a 61 year old female here for follow-up on blood pressure.  Elevated blood pressure reading without diagnosis of hypertension: -Medications: Nothing currently -Highest BP at home: 147/93; highest 157 but has been more stressed at home because of taking care of sick husband  -Denies any SOB, CP, vision changes, LE edema or symptoms of hypotension. Occasional headaches   Caregiver Stress: -Mood status: worse - having a hard time dealing with taking care of husband who has some medical issues currently, finds herself more stressed and reacting poorly Psychotherapy/counseling: no   Health maintenance: -Blood work due -Mammogram due    Review of Systems  Constitutional:  Negative for chills and fever.  Eyes:  Negative for blurred vision.  Respiratory:  Negative for shortness of breath.   Cardiovascular:  Negative for chest pain.  Neurological:  Positive for headaches. Negative for dizziness.      Objective:     BP 116/78   Pulse 91   Temp 97.6 F (36.4 C)   Resp 16   Ht '5\' 9"'$  (1.753 m)   Wt 189 lb 8 oz (86 kg)   SpO2 96%   BMI 27.98 kg/m  BP Readings from Last 3 Encounters:  09/04/21 116/78  03/30/21 116/68  05/30/20 (!) 141/95   Wt Readings from Last 3 Encounters:  09/04/21 189 lb 8 oz (86 kg)  03/30/21 177 lb 11.2 oz (80.6 kg)  05/30/20 200 lb (90.7 kg)      Physical Exam Constitutional:      Appearance: Normal appearance.  HENT:     Head: Normocephalic and atraumatic.  Eyes:     Conjunctiva/sclera: Conjunctivae normal.  Cardiovascular:     Rate and Rhythm: Normal rate and regular rhythm.  Pulmonary:      Effort: Pulmonary effort is normal.     Breath sounds: Normal breath sounds.  Skin:    General: Skin is warm and dry.  Neurological:     General: No focal deficit present.     Mental Status: She is alert. Mental status is at baseline.  Psychiatric:        Mood and Affect: Mood normal.        Behavior: Behavior normal.      No results found for any visits on 09/04/21.  Last CBC Lab Results  Component Value Date   WBC 9.6 01/22/2019   HGB 15.7 (H) 01/22/2019   HCT 44.7 01/22/2019   MCV 86.5 01/22/2019   MCH 30.4 01/22/2019   RDW 13.0 01/22/2019   PLT 241 63/14/9702   Last metabolic panel Lab Results  Component Value Date   GLUCOSE 101 (H) 01/22/2019   NA 140 01/22/2019   K 3.8 01/22/2019   CL 107 01/22/2019   CO2 24 01/22/2019   BUN 20 01/22/2019   CREATININE 0.46 01/22/2019   GFRNONAA >60 01/22/2019   CALCIUM 9.1 01/22/2019   PROT 6.8 01/22/2019   ALBUMIN 3.9 01/22/2019   BILITOT 0.4 01/22/2019   ALKPHOS 71 01/22/2019   AST 39 01/22/2019   ALT 44 01/22/2019   ANIONGAP 9 01/22/2019   Last lipids No results found  for: "CHOL", "HDL", "LDLCALC", "LDLDIRECT", "TRIG", "CHOLHDL" Last hemoglobin A1c No results found for: "HGBA1C" Last thyroid functions No results found for: "TSH", "T3TOTAL", "T4TOTAL", "THYROIDAB" Last vitamin D No results found for: "25OHVITD2", "25OHVITD3", "VD25OH" Last vitamin B12 and Folate No results found for: "VITAMINB12", "FOLATE"    The ASCVD Risk score (Arnett DK, et al., 2019) failed to calculate for the following reasons:   Cannot find a previous HDL lab   Cannot find a previous total cholesterol lab    Assessment & Plan:   1. Stress: Start low dose Lexapro 5 mg daily, discussed all potential side effects and all questions were answered. Follow up in 6 weeks for recheck.   - escitalopram (LEXAPRO) 5 MG tablet; Take 1 tablet (5 mg total) by mouth daily.  Dispense: 30 tablet; Refill: 1  2. Elevated blood pressure, situational:  Blood pressure good here today, has had some spikes at home most likely related to stress which we will treat. Due for screening labs which will be obtained today. Continue to monitor blood pressure at home and recheck at follow up.   - CBC w/Diff/Platelet - COMPLETE METABOLIC PANEL WITH GFR  3. Lipid screening/Need for hepatitis C screening test/Encounter for screening for HIV: Screening due.   - Lipid Profile - Hepatitis C Antibody - HIV antibody (with reflex)  Return in about 6 weeks (around 10/16/2021).    Teodora Medici, DO

## 2021-09-04 NOTE — Patient Instructions (Signed)
It was great seeing you today!  Plan discussed at today's visit: -Blood work ordered today, results will be uploaded to MyChart. -Try Lexapro 5 mg if you have side effects can stop it -Continue to check blood pressure at home    Follow up in: 6 weeks  Take care and let us know if you have any questions or concerns prior to your next visit.  Dr. Rosana Berger

## 2021-09-07 LAB — COMPLETE METABOLIC PANEL WITH GFR
AG Ratio: 2 (calc) (ref 1.0–2.5)
ALT: 29 U/L (ref 6–29)
AST: 27 U/L (ref 10–35)
Albumin: 4.5 g/dL (ref 3.6–5.1)
Alkaline phosphatase (APISO): 87 U/L (ref 37–153)
BUN: 24 mg/dL (ref 7–25)
CO2: 23 mmol/L (ref 20–32)
Calcium: 9.8 mg/dL (ref 8.6–10.4)
Chloride: 109 mmol/L (ref 98–110)
Creat: 0.64 mg/dL (ref 0.50–1.05)
Globulin: 2.2 g/dL (calc) (ref 1.9–3.7)
Glucose, Bld: 88 mg/dL (ref 65–99)
Potassium: 4.2 mmol/L (ref 3.5–5.3)
Sodium: 139 mmol/L (ref 135–146)
Total Bilirubin: 0.2 mg/dL (ref 0.2–1.2)
Total Protein: 6.7 g/dL (ref 6.1–8.1)
eGFR: 101 mL/min/{1.73_m2} (ref >=60)

## 2021-09-07 LAB — CBC WITH DIFFERENTIAL/PLATELET
Absolute Monocytes: 622 cells/uL (ref 200–950)
Basophils Absolute: 61 cells/uL (ref 0–200)
Basophils Relative: 0.6 %
Eosinophils Absolute: 194 cells/uL (ref 15–500)
Eosinophils Relative: 1.9 %
HCT: 44.4 % (ref 35.0–45.0)
Hemoglobin: 15.3 g/dL (ref 11.7–15.5)
Lymphs Abs: 3601 cells/uL (ref 850–3900)
MCH: 30.8 pg (ref 27.0–33.0)
MCHC: 34.5 g/dL (ref 32.0–36.0)
MCV: 89.5 fL (ref 80.0–100.0)
MPV: 10 fL (ref 7.5–12.5)
Monocytes Relative: 6.1 %
Neutro Abs: 5722 cells/uL (ref 1500–7800)
Neutrophils Relative %: 56.1 %
Platelets: 238 10*3/uL (ref 140–400)
RBC: 4.96 10*6/uL (ref 3.80–5.10)
RDW: 12.2 % (ref 11.0–15.0)
Total Lymphocyte: 35.3 %
WBC: 10.2 10*3/uL (ref 3.8–10.8)

## 2021-09-07 LAB — HIV ANTIBODY (ROUTINE TESTING W REFLEX): HIV 1&2 Ab, 4th Generation: NONREACTIVE

## 2021-09-07 LAB — LIPID PANEL
Cholesterol: 189 mg/dL (ref <200)
HDL: 64 mg/dL (ref >=50)
LDL Cholesterol (Calc): 96 mg/dL (calc)
Non-HDL Cholesterol (Calc): 125 mg/dL (calc) (ref <130)
Total CHOL/HDL Ratio: 3 (calc) (ref <5.0)
Triglycerides: 203 mg/dL — ABNORMAL HIGH (ref <150)

## 2021-09-07 LAB — HEPATITIS C ANTIBODY: Hepatitis C Ab: NONREACTIVE

## 2021-09-10 ENCOUNTER — Encounter: Payer: Self-pay | Admitting: Internal Medicine

## 2021-10-16 ENCOUNTER — Ambulatory Visit: Payer: 59 | Admitting: Internal Medicine

## 2022-08-28 DIAGNOSIS — R350 Frequency of micturition: Secondary | ICD-10-CM | POA: Diagnosis not present

## 2022-08-28 DIAGNOSIS — N390 Urinary tract infection, site not specified: Secondary | ICD-10-CM | POA: Diagnosis not present

## 2022-08-28 DIAGNOSIS — R300 Dysuria: Secondary | ICD-10-CM | POA: Diagnosis not present

## 2022-10-03 NOTE — Progress Notes (Unsigned)
   Acute Office Visit  Subjective:     Patient ID: Sharon Wright, female    DOB: [birth year], 61 y.o.   MRN: [id number]  No chief complaint on file.   HPI Patient is in today for hip pain.   HIP PAIN Duration: {Blank single:19197::"chronic","days","weeks","months"} Involved hip: {Blank single:19197::"left","right","bilateral"}  Mechanism of injury: {Blank single:19197::"trauma","unknown"} Location: {Blank single:19197::"anterior","posterior","lateral","medial","diffuse"} Onset: {Blank single:19197::"sudden","gradual"}  Severity: {Blank single:19197::"mild","moderate","severe","1/10","2/10","3/10","4/10","5/10","6/10","7/10","8/10","9/10","10/10"}  Quality: {Blank multiple:19196::"sharp","dull","aching","burning","cramping","ill-defined","itchy","pressure-like","pulling","shooting","sore","stabbing","tender","tearing","throbbing"} Frequency: {Blank single:19197::"constant","intermittent","occasional","rare","every few minutes","a few times a hour","a few times a day","a few times a week","a few times a month","a few times a year"} Radiation: {Blank single:19197::"yes","no"} Aggravating factors: {Blank multiple:19196::"weight bearing","walking","running","stairs","bending","movement","prolonged sitting"}   Alleviating factors: {Blank multiple:19196::"nothing","ice","physical therapy","HEP","APAP","NSAIDs","crutches","rest"}  Status: {Blank multiple:19196::"better","worse","stable","fluctuating"} Treatments attempted: {Blank multiple:19196::"none","rest","ice","heat","APAP","ibuprofen","aleve","physical therapy","HEP","chiropractor"}   Relief with NSAIDs?: {Blank single:19197::"No NSAIDs Taken","no","mild","moderate","significant"} Weakness with weight bearing: {Blank single:19197::"yes","no"} Weakness with walking: {Blank single:19197::"yes","no"} Paresthesias / decreased sensation: {Blank single:19197::"yes","no"} Swelling: {Blank single:19197::"yes","no"} Redness:{Blank  single:19197::"yes","no"} Fevers: {Blank single:19197::"yes","no"}   ROS      Objective:    There were no vitals taken for this visit. {Vitals History (Optional):23777}  Physical Exam  No results found for any visits on 10/04/22.      Assessment & Plan:   Problem List Items Addressed This Visit   None   No orders of the defined types were placed in this encounter.   No follow-ups on file.  Margarita Mail, DO

## 2022-10-04 ENCOUNTER — Ambulatory Visit (INDEPENDENT_AMBULATORY_CARE_PROVIDER_SITE_OTHER): Payer: BC Managed Care – PPO | Admitting: Internal Medicine

## 2022-10-04 ENCOUNTER — Encounter: Payer: Self-pay | Admitting: Internal Medicine

## 2022-10-04 ENCOUNTER — Ambulatory Visit
Admission: RE | Admit: 2022-10-04 | Discharge: 2022-10-04 | Disposition: A | Discharge status: Home / Self Care | Payer: BC Managed Care – PPO | Attending: Internal Medicine | Admitting: Internal Medicine

## 2022-10-04 ENCOUNTER — Ambulatory Visit
Admission: RE | Admit: 2022-10-04 | Discharge: 2022-10-04 | Disposition: A | Discharge status: Home / Self Care | Payer: BC Managed Care – PPO | Source: Ambulatory Visit | Attending: Internal Medicine | Admitting: Internal Medicine

## 2022-10-04 VITALS — BP 118/74 | HR 80 | Temp 97.9°F | Resp 16 | Ht 69.0 in | Wt 181.1 lb

## 2022-10-04 DIAGNOSIS — M160 Bilateral primary osteoarthritis of hip: Secondary | ICD-10-CM | POA: Diagnosis not present

## 2022-10-04 DIAGNOSIS — M25552 Pain in left hip: Secondary | ICD-10-CM | POA: Insufficient documentation

## 2022-10-04 DIAGNOSIS — M069 Rheumatoid arthritis, unspecified: Secondary | ICD-10-CM | POA: Diagnosis not present

## 2022-10-04 DIAGNOSIS — M0609 Rheumatoid arthritis without rheumatoid factor, multiple sites: Secondary | ICD-10-CM | POA: Diagnosis not present

## 2022-10-04 NOTE — Patient Instructions (Addendum)
It was great seeing you today!  Plan discussed at today's visit: -X-ray today -Recommend anti-inflammatories 400-600 mg every 4-6 hours as needed take with food -Also recommend heating pad and Voltaren gel over the counter   Follow up in: 1 month   Take care and let us know if you have any questions or concerns prior to your next visit.  Dr. Caralee Ates

## 2022-10-29 NOTE — Progress Notes (Deleted)
   Acute Office Visit  Subjective:     Patient ID: Sharon Wright, female    DOB: 1961-01-05, 62 y.o.   MRN: [id number]  No chief complaint on file.   HPI Patient is in today for follow up on hip pain.   HIP PAIN Duration:  week  Involved hip: left  Mechanism of injury:  was standing, turned and felt a pop Location:  started lateral, now through groin Onset: sudden  Quality: sharp and pulling Frequency: a few times a day Radiation: yes Aggravating factors: bending and movement   Alleviating factors: laying down  Status: Stable Treatments attempted: rest and APAP   Relief with NSAIDs?: No NSAIDs Taken Weakness with weight bearing: no Weakness with walking: no Paresthesias / decreased sensation: no Swelling: no Redness:no X-ray from [facility] showing mild symmetric OA change of the change of the hips with preferential axial joint space loss associated with RA  Review of Systems  Constitutional:  Negative for chills and fever.  Musculoskeletal:  Positive for joint pain.        Objective:    There were no vitals taken for this visit. BP Readings from Last 3 Encounters:  10/04/22 118/74  09/04/21 116/78  03/30/21 116/68   Wt Readings from Last 3 Encounters:  10/04/22 181 lb 1.6 oz (82.1 kg)  09/04/21 189 lb 8 oz (86 kg)  03/30/21 177 lb 11.2 oz (80.6 kg)      Physical Exam Constitutional:      Appearance: Normal appearance.  HENT:     Head: Normocephalic and atraumatic.  Eyes:     Conjunctiva/sclera: Conjunctivae normal.  Cardiovascular:     Rate and Rhythm: Normal rate and regular rhythm.  Pulmonary:     Effort: Pulmonary effort is normal.     Breath sounds: Normal breath sounds.  Musculoskeletal:     Left hip: No tenderness or bony tenderness. Decreased range of motion.     Comments: Pain with internal rotation of the hip  Skin:    General: Skin is warm and dry.  Neurological:     General: No focal deficit present.     Mental Status: She is alert.  Mental status is at baseline.  Psychiatric:        Mood and Affect: Mood normal.        Behavior: Behavior normal.     No results found for any visits on 11/03/22.      Assessment & Plan:   1. Pain of left hip: Continuous could be more impingement or arthritis, will obtain an x-ray of the hip.  For now we will take anti-inflammatories as needed, use heating pad and topical medications.  Follow-up in 1 month for recheck.  - DG Hip Unilat W OR W/O Pelvis 2-3 Views Left; Future  No follow-ups on file.  Margarita Mail, DO

## 2022-11-03 ENCOUNTER — Ambulatory Visit: Payer: BC Managed Care – PPO | Admitting: Internal Medicine

## 2022-11-10 NOTE — Progress Notes (Signed)
Established Office Visit  Subjective:     Patient ID: Sharon Wright, female    DOB: 09/30/1960, 62 y.o.   MRN: [id number]  Chief Complaint  Patient presents with   Follow-up   Hip Pain    HPI Patient is in today for follow up on hip pain.   HIP PAIN -Has now been going on for several months, can be worse first thing in the morning and worse as the day goes on as well -Takes Tylenol as needed but states it is not controlling pain -X-ray from [facility] showing mild symmetric OA change of the change of the hips with preferential axial joint space loss associated with RA  Health Maintenance: -Blood work due -Mammogram due -Lung cancer screening due -Colonoscopy 2021, repeat in 7 years  Review of Systems  Constitutional:  Negative for chills and fever.  Musculoskeletal:  Positive for joint pain.        Objective:    BP 116/80   Pulse 83   Temp 97.6 F (36.4 C)   Resp 18   Ht 5\' 9"  (1.753 m)   Wt 183 lb 3.2 oz (83.1 kg)   SpO2 96%   BMI 27.05 kg/m  BP Readings from Last 3 Encounters:  11/12/22 116/80  10/04/22 118/74  09/04/21 116/78   Wt Readings from Last 3 Encounters:  11/12/22 183 lb 3.2 oz (83.1 kg)  10/04/22 181 lb 1.6 oz (82.1 kg)  09/04/21 189 lb 8 oz (86 kg)      Physical Exam Constitutional:      Appearance: Normal appearance.  HENT:     Head: Normocephalic and atraumatic.  Eyes:     Conjunctiva/sclera: Conjunctivae normal.  Cardiovascular:     Rate and Rhythm: Normal rate and regular rhythm.  Pulmonary:     Effort: Pulmonary effort is normal.     Breath sounds: Normal breath sounds.     Comments: Decreased air movement throughout Skin:    General: Skin is warm and dry.  Neurological:     General: No focal deficit present.     Mental Status: She is alert. Mental status is at baseline.  Psychiatric:        Mood and Affect: Mood normal.        Behavior: Behavior normal.     No results found for any visits on 11/12/22.       Assessment & Plan:   1. Left hip pain: Due for annual labs. Per x-ray, concerns for potential RA, will obtain RF, ANA and inflammatory markers. If labs positive will refer to Rheumatology. If negative will treat OA. Patient interested in steroid injection, potentially refer to Ortho. For now recommend heating pad, NSAID's as needed, topical medications.   - CBC w/Diff/Platelet - COMPLETE METABOLIC PANEL WITH GFR - Rheumatoid Factor - Antinuclear Antib (ANA) - Sed Rate (ESR) - C-reactive protein  2. Lipid screening: Lipid panel screening ordered today.   - Lipid Profile  3. Encounter for screening mammogram for malignant neoplasm of breast: Mammogram ordered.   - MM 3D SCREENING MAMMOGRAM BILATERAL BREAST; Future  4. Screening for lung cancer: Referral for lung cancer screening placed.   - Ambulatory Referral Lung Cancer Screening [facility]  5. Vaccine for streptococcus pneumoniae and influenza: Prevnar 20 administered today.   - Pneumococcal conjugate vaccine 20-valent (Prevnar 20)   Return in about 1 year (around 11/12/2023), or if symptoms worsen or fail to improve.  Margarita Mail, DO

## 2022-11-12 ENCOUNTER — Ambulatory Visit (INDEPENDENT_AMBULATORY_CARE_PROVIDER_SITE_OTHER): Payer: BC Managed Care – PPO | Admitting: Internal Medicine

## 2022-11-12 ENCOUNTER — Encounter: Payer: Self-pay | Admitting: Internal Medicine

## 2022-11-12 VITALS — BP 116/80 | HR 83 | Temp 97.6°F | Resp 18 | Ht 69.0 in | Wt 183.2 lb

## 2022-11-12 DIAGNOSIS — Z23 Encounter for immunization: Secondary | ICD-10-CM

## 2022-11-12 DIAGNOSIS — Z122 Encounter for screening for malignant neoplasm of respiratory organs: Secondary | ICD-10-CM

## 2022-11-12 DIAGNOSIS — Z1231 Encounter for screening mammogram for malignant neoplasm of breast: Secondary | ICD-10-CM

## 2022-11-12 DIAGNOSIS — M25552 Pain in left hip: Secondary | ICD-10-CM

## 2022-11-12 DIAGNOSIS — Z13220 Encounter for screening for lipoid disorders: Secondary | ICD-10-CM | POA: Diagnosis not present

## 2022-11-14 LAB — CBC WITH DIFFERENTIAL/PLATELET
Absolute Monocytes: 611 {cells}/uL (ref 200–950)
Basophils Absolute: 52 {cells}/uL (ref 0–200)
Basophils Relative: 0.6 %
Eosinophils Absolute: 120 {cells}/uL (ref 15–500)
Eosinophils Relative: 1.4 %
HCT: 46.3 % — ABNORMAL HIGH (ref 35.0–45.0)
Hemoglobin: 15.5 g/dL (ref 11.7–15.5)
Lymphs Abs: 3397 {cells}/uL (ref 850–3900)
MCH: 30.6 pg (ref 27.0–33.0)
MCHC: 33.5 g/dL (ref 32.0–36.0)
MCV: 91.5 fL (ref 80.0–100.0)
MPV: 10 fL (ref 7.5–12.5)
Monocytes Relative: 7.1 %
Neutro Abs: 4420 {cells}/uL (ref 1500–7800)
Neutrophils Relative %: 51.4 %
Platelets: 248 10*3/uL (ref 140–400)
RBC: 5.06 10*6/uL (ref 3.80–5.10)
RDW: 12.7 % (ref 11.0–15.0)
Total Lymphocyte: 39.5 %
WBC: 8.6 10*3/uL (ref 3.8–10.8)

## 2022-11-14 LAB — COMPLETE METABOLIC PANEL WITH GFR
AG Ratio: 1.9 (calc) (ref 1.0–2.5)
ALT: 19 U/L (ref 6–29)
AST: 18 U/L (ref 10–35)
Albumin: 4.1 g/dL (ref 3.6–5.1)
Alkaline phosphatase (APISO): 97 U/L (ref 37–153)
BUN: 16 mg/dL (ref 7–25)
CO2: 26 mmol/L (ref 20–32)
Calcium: 9.3 mg/dL (ref 8.6–10.4)
Chloride: 104 mmol/L (ref 98–110)
Creat: 0.56 mg/dL (ref 0.50–1.05)
Globulin: 2.2 g/dL (ref 1.9–3.7)
Glucose, Bld: 86 mg/dL (ref 65–99)
Potassium: 4.3 mmol/L (ref 3.5–5.3)
Sodium: 138 mmol/L (ref 135–146)
Total Bilirubin: 0.3 mg/dL (ref 0.2–1.2)
Total Protein: 6.3 g/dL (ref 6.1–8.1)
eGFR: 104 mL/min/{1.73_m2} (ref >=60)

## 2022-11-14 LAB — LIPID PANEL
Cholesterol: 176 mg/dL (ref <200)
HDL: 71 mg/dL (ref >=50)
LDL Cholesterol (Calc): 86 mg/dL
Non-HDL Cholesterol (Calc): 105 mg/dL (ref <130)
Total CHOL/HDL Ratio: 2.5 (calc) (ref <5.0)
Triglycerides: 101 mg/dL (ref <150)

## 2022-11-14 LAB — ANA: Anti Nuclear Antibody (ANA): NEGATIVE

## 2022-11-14 LAB — C-REACTIVE PROTEIN: CRP: <3 mg/L (ref <8.0)

## 2022-11-14 LAB — RHEUMATOID FACTOR: Rheumatoid fact SerPl-aCnc: <10 [IU]/mL (ref <14)

## 2022-11-14 LAB — SEDIMENTATION RATE: Sed Rate: 2 mm/h (ref 0–30)

## 2022-11-15 NOTE — Addendum Note (Signed)
Addended by: Margarita Mail on: 11/15/2022 08:02 AM   Modules accepted: Orders

## 2023-01-14 ENCOUNTER — Other Ambulatory Visit: Payer: Self-pay

## 2023-01-14 ENCOUNTER — Ambulatory Visit (INDEPENDENT_AMBULATORY_CARE_PROVIDER_SITE_OTHER): Payer: BC Managed Care – PPO | Admitting: Nurse Practitioner

## 2023-01-14 ENCOUNTER — Encounter: Payer: Self-pay | Admitting: Nurse Practitioner

## 2023-01-14 VITALS — BP 128/84 | HR 77 | Temp 98.1°F | Resp 16 | Ht 69.0 in | Wt 185.3 lb

## 2023-01-14 DIAGNOSIS — R311 Benign essential microscopic hematuria: Secondary | ICD-10-CM | POA: Diagnosis not present

## 2023-01-14 DIAGNOSIS — R350 Frequency of micturition: Secondary | ICD-10-CM

## 2023-01-14 LAB — POCT URINALYSIS DIPSTICK
Bilirubin, UA: NEGATIVE
Glucose, UA: NEGATIVE
Ketones, UA: NEGATIVE
Nitrite, UA: NEGATIVE
Protein, UA: POSITIVE — AB
Spec Grav, UA: 1.02 (ref 1.010–1.025)
Urobilinogen, UA: 0.2 U/dL
pH, UA: 5 (ref 5.0–8.0)

## 2023-01-14 MED ORDER — NITROFURANTOIN MONOHYD MACRO 100 MG PO CAPS: 100.0000 mg | ORAL_CAPSULE | Freq: Two times a day (BID) | ORAL | 0 refills | Status: DC

## 2023-01-14 NOTE — Progress Notes (Signed)
BP 128/84   Pulse 77   Temp 98.1 F (36.7 C) (Oral)   Resp 16   Ht 5\' 9"  (1.753 m)   Wt 185 lb 4.8 oz (84.1 kg)   SpO2 99%   BMI 27.36 kg/m    Subjective:    Patient ID: Sharon Wright, female    DOB: Jan 24, 1961, 62 y.o.   MRN: [id number]  HPI: KATELEN WINCHEL is a 62 y.o. female  Chief Complaint  Patient presents with   Urinary Frequency    Burning, pain for 6 days   URINARY SYMPTOMS Symptoms started a week ago Dysuria: yes Urinary frequency: yes Urgency: yes Small volume voids: yes Symptom severity:  moderate Urinary incontinence: no Foul odor: no Hematuria: no Abdominal pain: no Back pain: yes Suprapubic pain/pressure: yes Flank pain: no Fever:  subjective Vomiting: no Relief with cranberry juice:  n/a Relief with pyridium: no Status: same Previous urinary tract infection: yes Recurrent urinary tract infection: yes Treatments attempted: antibiotics, took four days of cypro, still having symptoms.     Urine dip positive for blood, protein and leukocytes, will send urine for culture and start macrobid. Push fluids.  She does have left CVA tenderness, and subjective fever If worsening symptoms discussed going to er.   Relevant past medical, surgical, family and social history reviewed and updated as indicated. Interim medical history since our last visit reviewed. Allergies and medications reviewed and updated.  Review of Systems  Ten systems reviewed and is negative except as mentioned in HPI       Objective:    BP 128/84   Pulse 77   Temp 98.1 F (36.7 C) (Oral)   Resp 16   Ht 5\' 9"  (1.753 m)   Wt 185 lb 4.8 oz (84.1 kg)   SpO2 99%   BMI 27.36 kg/m   Wt Readings from Last 3 Encounters:  01/14/23 185 lb 4.8 oz (84.1 kg)  11/12/22 183 lb 3.2 oz (83.1 kg)  10/04/22 181 lb 1.6 oz (82.1 kg)    Physical Exam  Constitutional: Patient appears well-developed and well-nourished.  No distress.  HEENT: head atraumatic, normocephalic, pupils  equal and reactive to light, neck supple Cardiovascular: Normal rate, regular rhythm and normal heart sounds.  No murmur heard. No BLE edema. Pulmonary/Chest: Effort normal and breath sounds normal. No respiratory distress. Abdominal: Soft.  There is no tenderness. Left CVA tenderness Psychiatric: Patient has a normal mood and affect. behavior is normal. Judgment and thought content normal.  Results for orders placed or performed in visit on 01/14/23  POCT urinalysis dipstick  Result Value Ref Range   Color, UA yellow    Clarity, UA cloudy    Glucose, UA Negative Negative   Bilirubin, UA negative    Ketones, UA negative    Spec Grav, UA 1.020 1.010 - 1.025   Blood, UA large    pH, UA 5.0 5.0 - 8.0   Protein, UA Positive (A) Negative   Urobilinogen, UA 0.2 0.2 or 1.0 E.U./dL   Nitrite, UA negative    Leukocytes, UA Small (1+) (A) Negative   Appearance cloudy    Odor none       Assessment & Plan:   Problem List Items Addressed This Visit   None Visit Diagnoses     Urinary frequency    -  Primary   Urine dip positive for blood, protein and leukocytes, will send urine for culture and start macrobid. Push fluids. If worsening symptoms discussed going  to er.   Relevant Medications   nitrofurantoin, macrocrystal-monohydrate, (MACROBID) 100 MG capsule   Other Relevant Orders   POCT urinalysis dipstick (Completed)   Urine Culture        Follow up plan: Return if symptoms worsen or fail to improve.

## 2023-01-15 LAB — URINE CULTURE
MICRO NUMBER:: 15737032
Result:: NO GROWTH
SPECIMEN QUALITY:: ADEQUATE

## 2023-01-17 ENCOUNTER — Ambulatory Visit (INDEPENDENT_AMBULATORY_CARE_PROVIDER_SITE_OTHER): Payer: BC Managed Care – PPO | Admitting: Physician Assistant

## 2023-01-17 ENCOUNTER — Encounter: Payer: Self-pay | Admitting: Physician Assistant

## 2023-01-17 ENCOUNTER — Other Ambulatory Visit (HOSPITAL_COMMUNITY)
Admission: RE | Admit: 2023-01-17 | Discharge: 2023-01-17 | Disposition: A | Discharge status: Home / Self Care | Payer: BC Managed Care – PPO | Source: Ambulatory Visit | Attending: Physician Assistant | Admitting: Physician Assistant

## 2023-01-17 VITALS — BP 126/84 | HR 71 | Temp 97.7°F | Resp 16 | Ht 69.0 in | Wt 185.5 lb

## 2023-01-17 DIAGNOSIS — R350 Frequency of micturition: Secondary | ICD-10-CM | POA: Diagnosis not present

## 2023-01-17 DIAGNOSIS — B9689 Other specified bacterial agents as the cause of diseases classified elsewhere: Secondary | ICD-10-CM | POA: Diagnosis not present

## 2023-01-17 DIAGNOSIS — N760 Acute vaginitis: Secondary | ICD-10-CM | POA: Diagnosis not present

## 2023-01-17 DIAGNOSIS — Z87442 Personal history of urinary calculi: Secondary | ICD-10-CM

## 2023-01-17 DIAGNOSIS — R1012 Left upper quadrant pain: Secondary | ICD-10-CM | POA: Diagnosis not present

## 2023-01-17 DIAGNOSIS — N200 Calculus of kidney: Secondary | ICD-10-CM

## 2023-01-17 LAB — POCT URINALYSIS DIPSTICK
Bilirubin, UA: NEGATIVE
Glucose, UA: NEGATIVE
Ketones, UA: NEGATIVE
Leukocytes, UA: NEGATIVE
Nitrite, UA: NEGATIVE
Odor: NORMAL
Protein, UA: NEGATIVE
Spec Grav, UA: >=1.03 — AB (ref 1.010–1.025)
Urobilinogen, UA: 0.2 U/dL
pH, UA: 5 (ref 5.0–8.0)

## 2023-01-17 NOTE — Progress Notes (Signed)
Acute Office Visit   Patient: Sharon Wright   DOB: 11-24-1960   62 y.o. Female  MRN: [id number] Visit Date: 01/17/2023  Today's healthcare provider: Oswaldo Conroy Loreen Bankson, PA-C  Introduced myself to the patient as a Secondary school teacher and provided education on APPs in clinical practice.    Chief Complaint  Patient presents with   Headache   Urinary Frequency    Urine sx not changed, previous culture negative   Subjective    HPI HPI     Urinary Frequency    Additional comments: Urine sx not changed, previous culture negative      Last edited by Forde Radon, CMA on 01/17/2023  3:18 PM.      Concern for intermittent dysuria, headache and abdominal pain She reports LUQ pain as well as potential flank pain  Pain level: at times it can get up to 9/10, right now it is a constant dull pain with nagging sensation  Interventions: she has been taking Abx from her apt last week and tylenol  She states she has hx of kidney stones but has not kept follow up with Urology  She reports feeling some initial dysuria but once she is done there is relief but she is still having frequency   Medications: Outpatient Medications Prior to Visit  Medication Sig   acetaminophen (TYLENOL) 650 MG CR tablet Take 650 mg by mouth every 8 (eight) hours as needed for pain.   cholecalciferol (VITAMIN D) 1000 UNITS tablet Take 1,000 Units by mouth daily.    [DISCONTINUED] nitrofurantoin, macrocrystal-monohydrate, (MACROBID) 100 MG capsule Take 1 capsule (100 mg total) by mouth 2 (two) times daily.   No facility-administered medications prior to visit.    Review of Systems  Constitutional:  Positive for chills and diaphoresis. Negative for fatigue and fever.  Gastrointestinal:  Negative for diarrhea, nausea and vomiting.  Genitourinary:  Positive for dysuria and flank pain (unsure if this is from kidney vs back). Negative for decreased urine volume, difficulty urinating, enuresis, frequency,  hematuria, urgency, vaginal bleeding, vaginal discharge and vaginal pain.  Neurological:  Positive for headaches. Negative for dizziness and light-headedness.        Objective    BP 126/84   Pulse 71   Temp 97.7 F (36.5 C) (Temporal)   Resp 16   Ht 5\' 9"  (1.753 m)   Wt 185 lb 8 oz (84.1 kg)   SpO2 98%   BMI 27.39 kg/m     Physical Exam Vitals reviewed.  Constitutional:      General: She is awake.     Appearance: Normal appearance. She is well-developed and well-groomed.  HENT:     Head: Normocephalic and atraumatic.  Eyes:     General: Lids are normal. Gaze aligned appropriately.     Extraocular Movements: Extraocular movements intact.     Conjunctiva/sclera: Conjunctivae normal.  Pulmonary:     Effort: Pulmonary effort is normal.     Breath sounds: Decreased air movement present. Examination of the right-middle field reveals decreased breath sounds. Examination of the left-middle field reveals decreased breath sounds. Examination of the right-lower field reveals decreased breath sounds. Examination of the left-lower field reveals decreased breath sounds. Decreased breath sounds present. No wheezing, rhonchi or rales.  Abdominal:     General: Abdomen is flat. Bowel sounds are normal. There is no distension.     Palpations: Abdomen is soft.     Tenderness: There is abdominal tenderness in the  suprapubic area. There is right CVA tenderness and left CVA tenderness. There is no guarding or rebound.  Neurological:     General: No focal deficit present.     Mental Status: She is alert and oriented to person, place, and time.     GCS: GCS eye subscore is 4. GCS verbal subscore is 5. GCS motor subscore is 6.     Cranial Nerves: No cranial nerve deficit, dysarthria or facial asymmetry.  Psychiatric:        Attention and Perception: Attention and perception normal.        Mood and Affect: Mood and affect normal.        Speech: Speech normal.        Behavior: Behavior normal.  Behavior is cooperative.       Results for orders placed or performed in visit on 01/17/23  POCT urinalysis dipstick  Result Value Ref Range   Color, UA Yellow    Clarity, UA Cloudy    Glucose, UA Negative Negative   Bilirubin, UA Negative    Ketones, UA Negative    Spec Grav, UA >=1.030 (A) 1.010 - 1.025   Blood, UA Moderate    pH, UA 5.0 5.0 - 8.0   Protein, UA Negative Negative   Urobilinogen, UA 0.2 0.2 or 1.0 E.U./dL   Nitrite, UA Negative    Leukocytes, UA Negative Negative   Appearance yellow    Odor normal     Assessment & Plan      No follow-ups on file.      Problem List Items Addressed This Visit   None Visit Diagnoses     History of kidney stones    -  Primary   Relevant Orders   CT RENAL STONE STUDY   Urinary frequency       Relevant Orders   POCT urinalysis dipstick (Completed)   Left upper quadrant abdominal pain       Relevant Orders   CT RENAL STONE STUDY      Acute, recurrent concern Patient reports previous history of nephrolithiasis, unsure of most recent flare/episode (appears to have been in 2022 per available urology notes)  She was seen 01/14/2023 for dysuria and flank pain with concerning urine dip but urine culture was negative for bacterial growth.  We reviewed that she can stop her Macrobid at this time Urine dip today was also notable for blood but no protein or nitrites, leukocytes. Reviewed urine dip results with her during apt.  Given symptoms and history I suspect that she is likely having symptomatic nephrolithiasis. Will also get cervicovaginal swab for rule out.  I have ordered stat CT renal stone study to assist with diagnosis.  Results to dictate further management We reviewed ED and return precautions-increased pain, fever, chills, enuresis, confusion, low BP Of note there are decreased breath sounds bilaterally in the lower lung fields-unsure if this is related to current symptoms and left upper quadrant pain or incidental  finding on exam given that she is not expressing concerns for SOB, productive cough, coughing, chest tightness.  Will pursue urinary tract etiology at this time but we will keep pulmonary findings in mind during workup Follow-up as needed for progressing or persistent symptoms   No follow-ups on file.   I, Deicy Rusk E Zarra Geffert, PA-C, have reviewed all documentation for this visit. The documentation on 01/17/23 for the exam, diagnosis, procedures, and orders are all accurate and complete.   Jacquelin Hawking, MHS, PA-C [facility]

## 2023-01-18 ENCOUNTER — Ambulatory Visit
Admission: RE | Admit: 2023-01-18 | Discharge: 2023-01-18 | Disposition: A | Discharge status: Home / Self Care | Payer: BC Managed Care – PPO | Source: Ambulatory Visit | Attending: Physician Assistant | Admitting: Physician Assistant

## 2023-01-18 DIAGNOSIS — N200 Calculus of kidney: Secondary | ICD-10-CM | POA: Diagnosis not present

## 2023-01-18 DIAGNOSIS — R1012 Left upper quadrant pain: Secondary | ICD-10-CM

## 2023-01-18 DIAGNOSIS — I700 Atherosclerosis of aorta: Secondary | ICD-10-CM | POA: Diagnosis not present

## 2023-01-18 DIAGNOSIS — Z87442 Personal history of urinary calculi: Secondary | ICD-10-CM

## 2023-01-19 ENCOUNTER — Telehealth: Payer: Self-pay | Admitting: Internal Medicine

## 2023-01-19 LAB — CERVICOVAGINAL ANCILLARY ONLY
Bacterial Vaginitis (gardnerella): POSITIVE — AB
Candida Glabrata: NEGATIVE
Candida Vaginitis: NEGATIVE
Comment: NEGATIVE
Comment: NEGATIVE
Comment: NEGATIVE
Comment: NEGATIVE
Trichomonas: NEGATIVE

## 2023-01-19 NOTE — Telephone Encounter (Signed)
Copied from CRM [id number]. Topic: General - Inquiry >> Jan 19, 2023 11:40 AM De Blanch wrote: Reason for CRM: Pt is calling requesting results for CT from yesterday. Please advise.

## 2023-01-20 ENCOUNTER — Telehealth: Payer: Self-pay | Admitting: Internal Medicine

## 2023-01-20 MED ORDER — TAMSULOSIN HCL 0.4 MG PO CAPS: 0.4000 mg | ORAL_CAPSULE | Freq: Every day | ORAL | 0 refills | Status: DC

## 2023-01-20 MED ORDER — METRONIDAZOLE 500 MG PO TABS: 500.0000 mg | ORAL_TABLET | Freq: Two times a day (BID) | ORAL | 0 refills | Status: DC

## 2023-01-20 NOTE — Telephone Encounter (Signed)
FYI called patient to get more info b/c I dont see on scan blakage she states she feels like the Aortic Atherosclerosis (ICD10-I70.0). should be addressed.  She does not need referral and has already made an appt to see specialist to address.

## 2023-01-20 NOTE — Progress Notes (Signed)
Your cervicovaginal swab was positive for bacterial vaginosis.  I have sent in a script for Flagyl to be taken by mouth twice per day for 7 days. Please finish the entire course unless you are instructed to stop or develop an allergic reaction. Please note that this medication can cause severe nausea and vomiting if alcohol is consumed while you are taking it so please refrain from this during the week you are on it. Please let us know if you have further questions or concerns.   Your CT scan did show a small right kidney stone. I have sent in a medication called flomax to help with flushing this out. If you start to have increased pain, blood in the urine, fevers, chills please let us know or go to the ED  Please let us know if you have further questions or concerns

## 2023-01-20 NOTE — Progress Notes (Signed)
Aortic atherosclerosis means there are cholesterol plaques on the walls of the aorta. This is a common finding on CT scans especially for people over age 61 and is an incidental finding that rarely requires intervention other than cholesterol medication initiation if desired.Cholesterol levels and accumulations such as this are typically managed and stabilized with cholesterol medications such as statins. If your lab work indicates high cholesterol, you should follow up with  your PCP to discuss this further and start a statin medication.

## 2023-01-20 NOTE — Telephone Encounter (Signed)
Patient called upset that the blockage that was discovered on her CT Scan was not addressed but provider was more concerned with a possible vaginal infection that was the result of taking antibiotics. She is requesting a call back from the nurse or provider to let her know what will be done about the aortic blockage she has.

## 2023-01-20 NOTE — Addendum Note (Signed)
Addended by: Jacquelin Hawking on: 01/20/2023 01:35 PM   Modules accepted: Orders

## 2023-01-20 NOTE — Telephone Encounter (Signed)
The patient called back again just anxious as to what course of action her provider wants to take pertaining to her CT results. Please assist patient further

## 2023-01-24 ENCOUNTER — Telehealth: Payer: Self-pay | Admitting: Internal Medicine

## 2023-01-24 DIAGNOSIS — I700 Atherosclerosis of aorta: Secondary | ICD-10-CM

## 2023-01-24 NOTE — Telephone Encounter (Signed)
Referral Request - Did the patient discuss referral with their provider in the last year? no  Appointment offered? Yes and is made for Dec  3  Type of order/referral and detailed reason for visit: heart care, labs show problem with aorta Preference of office, provider, location: heart care  If referral order, have you been seen by this specialty before? no (If Yes, this issue or another issue? When? Where?  Can we respond through MyChart? yes

## 2023-01-31 NOTE — Progress Notes (Unsigned)
Established Patient Office Visit  Subjective   Patient ID: Sharon Wright, female    DOB: [birth year]  Age: 62 y.o. MRN: [id number]  No chief complaint on file.   HPI  Patient is here for referral for heart care.   Elevated blood pressure reading without diagnosis of hypertension: -Medications: Nothing currently -Highest BP at home: 147/93; highest 157 but has been more stressed at home because of taking care of sick husband  -Denies any SOB, CP, vision changes, LE edema or symptoms of hypotension. Occasional headaches   Caregiver Stress: -Mood status: worse - having a hard time dealing with taking care of husband who has some medical issues currently, finds herself more stressed and reacting poorly Psychotherapy/counseling: no   Health maintenance: -Blood work UTD -Mammogram due, ordered previously    Review of Systems  Constitutional:  Negative for chills and fever.  Eyes:  Negative for blurred vision.  Respiratory:  Negative for shortness of breath.   Cardiovascular:  Negative for chest pain.  Neurological:  Positive for headaches. Negative for dizziness.      Objective:     There were no vitals taken for this visit. BP Readings from Last 3 Encounters:  01/17/23 126/84  01/14/23 128/84  11/12/22 116/80   Wt Readings from Last 3 Encounters:  01/17/23 185 lb 8 oz (84.1 kg)  01/14/23 185 lb 4.8 oz (84.1 kg)  11/12/22 183 lb 3.2 oz (83.1 kg)      Physical Exam Constitutional:      Appearance: Normal appearance.  HENT:     Head: Normocephalic and atraumatic.  Eyes:     Conjunctiva/sclera: Conjunctivae normal.  Cardiovascular:     Rate and Rhythm: Normal rate and regular rhythm.  Pulmonary:     Effort: Pulmonary effort is normal.     Breath sounds: Normal breath sounds.  Skin:    General: Skin is warm and dry.  Neurological:     General: No focal deficit present.     Mental Status: She is alert. Mental status is at baseline.  Psychiatric:         Mood and Affect: Mood normal.        Behavior: Behavior normal.      No results found for any visits on 02/01/23.  Last CBC Lab Results  Component Value Date   WBC 8.6 11/12/2022   HGB 15.5 11/12/2022   HCT 46.3 (H) 11/12/2022   MCV 91.5 11/12/2022   MCH 30.6 11/12/2022   RDW 12.7 11/12/2022   PLT 248 11/12/2022   Last metabolic panel Lab Results  Component Value Date   GLUCOSE 86 11/12/2022   NA 138 11/12/2022   K 4.3 11/12/2022   CL 104 11/12/2022   CO2 26 11/12/2022   BUN 16 11/12/2022   CREATININE 0.56 11/12/2022   GFRNONAA >60 01/22/2019   CALCIUM 9.3 11/12/2022   PROT 6.3 11/12/2022   ALBUMIN 3.9 01/22/2019   BILITOT 0.3 11/12/2022   ALKPHOS 71 01/22/2019   AST 18 11/12/2022   ALT 19 11/12/2022   ANIONGAP 9 01/22/2019   Last lipids Lab Results  Component Value Date   CHOL 176 11/12/2022   HDL 71 11/12/2022   LDLCALC 86 11/12/2022   TRIG 101 11/12/2022   CHOLHDL 2.5 11/12/2022   Last hemoglobin A1c No results found for: "HGBA1C" Last thyroid functions No results found for: "TSH", "T3TOTAL", "T4TOTAL", "THYROIDAB" Last vitamin D No results found for: "25OHVITD2", "25OHVITD3", "VD25OH" Last vitamin B12 and Folate No results  found for: "VITAMINB12", "FOLATE"    The 10-year ASCVD risk score (Arnett DK, et al., 2019) is: 5.6%    Assessment & Plan:   1. Stress: Start low dose Lexapro 5 mg daily, discussed all potential side effects and all questions were answered. Follow up in 6 weeks for recheck.   - escitalopram (LEXAPRO) 5 MG tablet; Take 1 tablet (5 mg total) by mouth daily.  Dispense: 30 tablet; Refill: 1  2. Elevated blood pressure, situational: Blood pressure good here today, has had some spikes at home most likely related to stress which we will treat. Due for screening labs which will be obtained today. Continue to monitor blood pressure at home and recheck at follow up.   - CBC w/Diff/Platelet - COMPLETE METABOLIC PANEL WITH GFR  3.  Lipid screening/Need for hepatitis C screening test/Encounter for screening for HIV: Screening due.   - Lipid Profile - Hepatitis C Antibody - HIV antibody (with reflex)  No follow-ups on file.    Margarita Mail, DO

## 2023-02-01 ENCOUNTER — Encounter: Payer: Self-pay | Admitting: Internal Medicine

## 2023-02-01 ENCOUNTER — Ambulatory Visit (INDEPENDENT_AMBULATORY_CARE_PROVIDER_SITE_OTHER): Payer: BC Managed Care – PPO | Admitting: Internal Medicine

## 2023-02-01 VITALS — BP 122/84 | HR 87 | Temp 97.7°F | Resp 18 | Wt 190.0 lb

## 2023-02-01 DIAGNOSIS — I2089 Other forms of angina pectoris: Secondary | ICD-10-CM | POA: Diagnosis not present

## 2023-02-01 DIAGNOSIS — I700 Atherosclerosis of aorta: Secondary | ICD-10-CM

## 2023-02-01 DIAGNOSIS — Z720 Tobacco use: Secondary | ICD-10-CM

## 2023-02-01 MED ORDER — VARENICLINE TARTRATE 0.5 MG PO TABS: ORAL_TABLET | ORAL | 0 refills | Status: DC

## 2023-02-01 NOTE — Patient Instructions (Addendum)
It was great seeing you today!  Plan discussed at today's visit: -Work on diet (more info below), increasing exercise and decreasing smoking (Chantix sent to pharmacy to help with tobacco cessation)  -Recommend considering statin to decrease cholesterol, more info below  Follow up in: 6 months   Take care and let us know if you have any questions or concerns prior to your next visit.  Dr. Reina Fuse and Cholesterol Restricted Eating Plan Eating a diet that limits fat and cholesterol may help lower your risk for heart disease and other conditions. Your body needs fat and cholesterol for basic functions, but eating too much of these things can be harmful to your health. Your health care provider may order lab tests to check your blood fat (lipid) and cholesterol levels. This helps your health care provider understand your risk for certain conditions and whether you need to make diet changes. Work with your health care provider or dietitian to make an eating plan that is right for you. Your plan includes: Limit your fat intake to ______% or less of your total calories a day. This is ______g of fat per day. Limit your saturated fat intake to ______% or less of your total calories a day. This is ______g of saturated fat per day. Limit the amount of cholesterol in your diet to less than _________mg a day. Eat ___________ g of fiber a day. What are tips for following this plan? General guidelines If you are overweight, work with your health care provider to lose weight safely. Losing just 5-10% of your body weight can improve your overall health and help prevent diseases such as diabetes and heart disease. Avoid: Foods with added sugar. Fried foods. Foods that contain partially hydrogenated oils, including stick margarine, some tub margarines, cookies, crackers, and other baked goods. If you drink alcohol: Limit how much you have to: 0-1 drink a day for women who are not pregnant. 0-2 drinks  a day for men. Know how much alcohol is in a drink. In the [place] one drink equals one 12 oz bottle of beer (355 mL), one 5 oz glass of wine (148 mL), or one 1 oz glass of hard liquor (44 mL). Reading food labels Check food labels for: Trans fats or partially hydrogenated oils. Avoid foods that contain these. High amounts of saturated fat. Choose foods that are low in saturated fat (less than 2 g). The amount of cholesterol in each serving. The amount of fiber in each serving. Choose foods with healthy fats, such as: Monounsaturated and polyunsaturated fats. These include olive and canola oil, flaxseeds, walnuts, almonds, and seeds. Omega-3 fats. These are found in foods such as salmon, mackerel, sardines, tuna, flaxseed oil, and ground flaxseeds. Choose grain products that have whole grains. Look for the word "whole" as the first word in the ingredient list. Cooking Cook foods using methods other than frying. Baking, boiling, grilling, and broiling are some healthy options. Eat more home-cooked food and less restaurant, buffet, and fast food. Avoid cooking using saturated fats. Animal sources of saturated fats include meats, butter, and cream. Plant sources of saturated fats include palm oil, palm kernel oil, and coconut oil. Meal planning  At meals, imagine dividing your plate into fourths: Fill one-half of your plate with vegetables, green salads, and fruit. Fill one-fourth of your plate with whole grains. Fill one-fourth of your plate with lean protein foods. Eat fish that is high in omega-3 fats at least two times a week. Eat more  foods that contain fiber, such as whole grains, beans, apples, pears, berries, broccoli, carrots, peas, and barley. These foods help promote healthy cholesterol levels in the blood. What foods should I eat? Fruits All fresh, canned (in natural juice), or frozen fruits. Vegetables Fresh or frozen vegetables (raw, steamed, roasted, or grilled). Green  salads. Grains Whole grains, such as whole wheat or whole grain breads, crackers, cereals, and pasta. Unsweetened oatmeal, bulgur, barley, quinoa, or brown rice. Corn or whole wheat flour tortillas. Meats and other proteins Ground beef (85% or leaner), grass-fed beef, or beef trimmed of fat. Skinless chicken or [place]. Ground chicken or [place]. Pork trimmed of fat. All fish and seafood. Egg whites. Dried beans, peas, or lentils. Unsalted nuts or seeds. Unsalted canned beans. Natural nut butters without added sugar and oil. Dairy Low-fat or nonfat dairy products, such as skim or 1% milk, 2% or reduced-fat cheeses, low-fat and fat-free ricotta or cottage cheese, or plain low-fat and nonfat yogurt. Fats and oils Tub margarine without trans fats. Light or reduced-fat mayonnaise and salad dressings. Avocado. Olive, canola, sesame, or safflower oils. The items listed above may not be a complete list of foods and beverages you can eat. Contact a dietitian for more information. What foods should I avoid? Fruits Canned fruit in heavy syrup. Fruit in cream or butter sauce. Fried fruit. Vegetables Vegetables cooked in cheese, cream, or butter sauce. Fried vegetables. Grains White bread. White pasta. White rice. Cornbread. Bagels, pastries, and croissants. Crackers and snack foods that contain trans fat and hydrogenated oils. Meats and other proteins Fatty cuts of meat. Ribs, chicken wings, bacon, sausage, bologna, salami, chitterlings, fatback, hot dogs, bratwurst, and packaged lunch meats. Liver and organ meats. Whole eggs and egg yolks. Chicken and [place] with skin. Fried meat. Dairy Whole or 2% milk, cream, half-and-half, and cream cheese. Whole milk cheeses. Whole-fat or sweetened yogurt. Full-fat cheeses. Nondairy creamers and whipped toppings. Processed cheese, cheese spreads, and cheese curds. Fats and oils Butter, stick margarine, lard, shortening, ghee, or bacon fat. Coconut, palm kernel, and  palm oils. Beverages Alcohol. Sugar-sweetened drinks such as sodas, lemonade, and fruit drinks. Sweets and desserts Corn syrup, sugars, honey, and molasses. Candy. Jam and jelly. Syrup. Sweetened cereals. Cookies, pies, cakes, donuts, muffins, and ice cream. The items listed above may not be a complete list of foods and beverages you should avoid. Contact a dietitian for more information. Summary Your body needs fat and cholesterol for basic functions. However, eating too much of these things can be harmful to your health. Work with your health care provider and dietitian to follow a diet that limits fat and cholesterol. Doing this may help lower your risk for heart disease and other conditions. Choose healthy fats, such as monounsaturated and polyunsaturated fats, and foods high in omega-3 fatty acids. Eat fiber-rich foods, such as whole grains, beans, peas, fruits, and vegetables. Limit or avoid alcohol, fried foods, and foods high in saturated fats, partially hydrogenated oils, and sugar. This information is not intended to replace advice given to you by your health care provider. Make sure you discuss any questions you have with your health care provider. Document Revised: 06/27/2020 Document Reviewed: 06/27/2020 Elsevier Patient Education  2024 Elsevier Inc.  Rosuvastatin Tablets What is this medication? ROSUVASTATIN (roe SOO va sta tin) treats high cholesterol and reduces the risk of heart attack and stroke. It works by decreasing bad cholesterol and fats (such as LDL, triglycerides) and increasing good cholesterol (HDL) in your blood. It belongs  to a group of medications called statins. Changes to diet and exercise are often combined with this medication. This medicine may be used for other purposes; ask your health care provider or pharmacist if you have questions. COMMON BRAND NAME(S): Crestor What should I tell my care team before I take this medication? They need to know if you have  any of these conditions: Diabetes Frequently drink alcohol Kidney disease Liver disease Muscle cramps, pain Stroke Thyroid disease An unusual or allergic reaction to rosuvastatin, other medications, foods, dyes, or preservatives Pregnant or trying to get pregnant Breastfeeding How should I use this medication? Take this medication by mouth with a glass of water. Follow the directions on the prescription label. You can take it with or without food. If it upsets your stomach, take it with food. Do not cut, crush or chew this medication. Swallow the tablets whole. Take your medication at regular intervals. Do not take it more often than directed. Take antacids that have a combination of aluminum and magnesium hydroxide in them at a different time of day than this medication. Take these products 2 hours AFTER this medication. Talk to your care team about the use of this medication in children. While this medication may be prescribed for children as young as 7 for selected conditions, precautions do apply. Overdosage: If you think you have taken too much of this medicine contact a poison control center or emergency room at once. NOTE: This medicine is only for you. Do not share this medicine with others. What if I miss a dose? If you miss a dose, take it as soon as you can. If your next dose is to be taken in less than 12 hours, then do not take the missed dose. Take the next dose at your regular time. Do not take double or extra doses. What may interact with this medication? Do not take this medication with any of the following: Supplements like red yeast rice This medication may also interact with the following: Alcohol Antacids containing aluminum hydroxide and magnesium hydroxide Cyclosporine Other medications for high cholesterol Some medications for HIV infection Warfarin This list may not describe all possible interactions. Give your health care provider a list of all the medicines,  herbs, non-prescription drugs, or dietary supplements you use. Also tell them if you smoke, drink alcohol, or use illegal drugs. Some items may interact with your medicine. What should I watch for while using this medication? Visit your care team for regular checks on your progress. Tell your care team if your symptoms do not start to get better or if they get worse. Your care team may tell you to stop taking this medication if you develop muscle problems. If your muscle problems do not go away after stopping this medication, contact your care team. Talk to your care team if you may be pregnant. Serious birth defects can occur if you take this medication during pregnancy. Talk to your care team before breastfeeding. Changes to your treatment plan may be needed. This medication may increase blood sugar. Ask your care team if changes in diet or medications are needed if you have diabetes. If you are going to need surgery or other procedure, tell your care team that you are using this medication. Taking this medication is only part of a total heart healthy program. Ask your care team if there are other changes you can make to improve your overall health. What side effects may I notice from receiving this medication? Side effects  that you should report to your care team as soon as possible: Allergic reactions--skin rash, itching, hives, swelling of the face, lips, tongue, or throat High blood sugar (hyperglycemia)--increased thirst or amount of urine, unusual weakness, fatigue, blurry vision Liver injury--right upper belly pain, loss of appetite, nausea, light-colored stool, dark yellow or brown urine, yellowing skin or eyes, unusual weakness, fatigue Muscle injury--unusual weakness, fatigue, muscle pain, dark yellow or brown urine, decrease in amount of urine Redness, blistering, peeling or loosening of the skin, including inside the mouth Side effects that usually do not require medical attention  (report to your care team if they continue or are bothersome): Fatigue Headache Nausea Stomach pain This list may not describe all possible side effects. Call your doctor for medical advice about side effects. You may report side effects to FDA at 1-800-FDA-1088. Where should I keep my medication? Keep out of the reach of children and pets. Store between 20 and 25 degrees C (68 and 77 degrees F). Get rid of any unused medication after the expiration date. To get rid of medications that are no longer needed or have expired: Take the medication to a medication take-back program. Check with your pharmacy or law enforcement to find a location. If you cannot return the medication, check the label or package insert to see if the medication should be thrown out in the garbage or flushed down the toilet. If you are not sure, ask your care team. If it is safe to put it in the trash, take the medication out of the container. Mix the medication with cat litter, dirt, coffee grounds, or other unwanted substance. Seal the mixture in a bag or container. Put it in the trash. NOTE: This sheet is a summary. It may not cover all possible information. If you have questions about this medicine, talk to your doctor, pharmacist, or health care provider.  2024 Elsevier/Gold Standard (2021-07-17 00:00:00)

## 2023-06-03 DIAGNOSIS — R059 Cough, unspecified: Secondary | ICD-10-CM | POA: Diagnosis not present

## 2023-06-03 DIAGNOSIS — R509 Fever, unspecified: Secondary | ICD-10-CM | POA: Diagnosis not present

## 2023-06-03 DIAGNOSIS — J029 Acute pharyngitis, unspecified: Secondary | ICD-10-CM | POA: Diagnosis not present

## 2023-06-03 DIAGNOSIS — J0110 Acute frontal sinusitis, unspecified: Secondary | ICD-10-CM | POA: Diagnosis not present

## 2023-06-03 DIAGNOSIS — R590 Localized enlarged lymph nodes: Secondary | ICD-10-CM | POA: Diagnosis not present

## 2023-08-05 ENCOUNTER — Ambulatory Visit: Payer: Self-pay | Admitting: Internal Medicine

## 2023-08-05 ENCOUNTER — Encounter: Payer: Self-pay | Admitting: Internal Medicine

## 2023-08-05 VITALS — BP 122/78 | HR 91 | Temp 98.0°F | Ht 69.0 in | Wt 195.4 lb

## 2023-08-05 DIAGNOSIS — R062 Wheezing: Secondary | ICD-10-CM

## 2023-08-05 DIAGNOSIS — R0683 Snoring: Secondary | ICD-10-CM

## 2023-08-05 DIAGNOSIS — R051 Acute cough: Secondary | ICD-10-CM

## 2023-08-05 DIAGNOSIS — Z720 Tobacco use: Secondary | ICD-10-CM

## 2023-08-05 DIAGNOSIS — Z122 Encounter for screening for malignant neoplasm of respiratory organs: Secondary | ICD-10-CM

## 2023-08-05 DIAGNOSIS — J069 Acute upper respiratory infection, unspecified: Secondary | ICD-10-CM | POA: Diagnosis not present

## 2023-08-05 LAB — POC COVID19/FLU A&B COMBO
Covid Antigen, POC: NEGATIVE
Influenza A Antigen, POC: NEGATIVE
Influenza B Antigen, POC: NEGATIVE

## 2023-08-05 MED ORDER — ALBUTEROL SULFATE HFA 108 (90 BASE) MCG/ACT IN AERS: 2.0000 | INHALATION_SPRAY | Freq: Four times a day (QID) | RESPIRATORY_TRACT | 2 refills | Status: AC | PRN

## 2023-08-05 MED ORDER — METHYLPREDNISOLONE 4 MG PO TBPK: ORAL_TABLET | ORAL | 0 refills | Status: DC

## 2023-08-05 MED ORDER — BENZONATATE 100 MG PO CAPS: 100.0000 mg | ORAL_CAPSULE | Freq: Three times a day (TID) | ORAL | 0 refills | Status: DC | PRN

## 2023-08-05 NOTE — Progress Notes (Signed)
 Established Patient Office Visit  Subjective   Patient ID: Sharon Wright, female    DOB: [birth year]  Age: 63 y.o. MRN: [id number]  Chief Complaint  Patient presents with   Medical Management of Chronic Issues   URI    Onset 6 day congestion, runny nose, facial pressure/pain    Discussed the use of AI scribe software for clinical note transcription with the patient, who gave verbal consent to proceed.  History of Present Illness Sharon Wright is a 63 year old female who presents with sinus symptoms and breathing difficulties.  She has experienced sinus symptoms for six days, beginning with a sore throat. Symptoms include significant nasal congestion, ear pain, and throat pain, especially in the mornings. She has been in a healthcare setting recently due to her sister's hospitalization, raising concerns about potential exposure to infections.  She experiences difficulty breathing, particularly when lying down, accompanied by loud snoring that disrupts her sleep. She feels tired and experiences shortness of breath during the day, especially when climbing ladders at work. She sometimes hears wheezing when lying down. She has a history of enlarged tonsils and continues to smoke despite previous attempts to quit.  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 She mentions swollen lymph nodes but expresses concern about cancer, although it is not prevalent in her family.   Elevated blood pressure reading without diagnosis of hypertension: -Medications: Nothing currently -Highest BP at home: not checking -Denies any SOB, CP, vision changes, LE edema or symptoms of hypotension. Occasional headaches   The 10-year ASCVD risk score (Arnett DK, et al., 2019) is: 5.8%   Values used to calculate the score:     Age: 54 years     Sex: Female     Is Non-Hispanic African American: No     Diabetic: No     Tobacco smoker: Yes     Systolic Blood  Pressure: 122 mmHg     Is BP treated: No     HDL Cholesterol: 71 mg/dL     Total Cholesterol: 176 mg/dL  Health maintenance: -Blood work UTD -Mammogram due, ordered previously, will call to schedule -Lung cancer screening due    Review of Systems  Constitutional:  Negative for chills and fever.  HENT:  Positive for congestion and sore throat.   Eyes:  Negative for blurred vision.  Respiratory:  Positive for cough and wheezing. Negative for shortness of breath.   Cardiovascular:  Negative for chest pain and palpitations.  Neurological:  Negative for dizziness.      Objective:     BP 122/78   Pulse 91   Temp 98 F (36.7 C)   Ht 5\' 9"  (1.753 m)   Wt 195 lb 6.4 oz (88.6 kg)   SpO2 95%   BMI 28.86 kg/m  BP Readings from Last 3 Encounters:  08/05/23 122/78  02/01/23 122/84  01/17/23 126/84   Wt Readings from Last 3 Encounters:  08/05/23 195 lb 6.4 oz (88.6 kg)  02/01/23 190 lb (86.2 kg)  01/17/23 185 lb 8 oz (84.1 kg)      Physical Exam Constitutional:      Appearance: Normal appearance.  HENT:     Head: Normocephalic and atraumatic.     Right Ear: Tympanic membrane, ear canal and external ear normal.     Left Ear: Tympanic membrane, ear canal and external ear normal.     Nose: Congestion present.     Mouth/Throat:  Mouth: Mucous membranes are moist.     Comments: Mild PND Eyes:     Conjunctiva/sclera: Conjunctivae normal.  Cardiovascular:     Rate and Rhythm: Normal rate and regular rhythm.  Pulmonary:     Effort: Pulmonary effort is normal.     Breath sounds: Wheezing present.     Comments: Decreased air sounds throughout  Skin:    General: Skin is warm and dry.  Neurological:     General: No focal deficit present.     Mental Status: She is alert. Mental status is at baseline.  Psychiatric:        Mood and Affect: Mood normal.        Behavior: Behavior normal.      No results found for any visits on 08/05/23.  Last CBC Lab Results   Component Value Date   WBC 8.6 11/12/2022   HGB 15.5 11/12/2022   HCT 46.3 (H) 11/12/2022   MCV 91.5 11/12/2022   MCH 30.6 11/12/2022   RDW 12.7 11/12/2022   PLT 248 11/12/2022   Last metabolic panel Lab Results  Component Value Date   GLUCOSE 86 11/12/2022   NA 138 11/12/2022   K 4.3 11/12/2022   CL 104 11/12/2022   CO2 26 11/12/2022   BUN 16 11/12/2022   CREATININE 0.56 11/12/2022   GFRNONAA >60 01/22/2019   CALCIUM 9.3 11/12/2022   PROT 6.3 11/12/2022   ALBUMIN 3.9 01/22/2019   BILITOT 0.3 11/12/2022   ALKPHOS 71 01/22/2019   AST 18 11/12/2022   ALT 19 11/12/2022   ANIONGAP 9 01/22/2019   Last lipids Lab Results  Component Value Date   CHOL 176 11/12/2022   HDL 71 11/12/2022   LDLCALC 86 11/12/2022   TRIG 101 11/12/2022   CHOLHDL 2.5 11/12/2022   Last hemoglobin A1c No results found for: "HGBA1C" Last thyroid functions No results found for: "TSH", "T3TOTAL", "T4TOTAL", "THYROIDAB" Last vitamin D No results found for: "25OHVITD2", "25OHVITD3", "VD25OH" Last vitamin B12 and Folate No results found for: "VITAMINB12", "FOLATE"    The 10-year ASCVD risk score (Arnett DK, et al., 2019) is: 5.8%    Assessment & Plan:   Assessment & Plan Upper Respiratory Infection Viral infection suspected, no antibiotics needed. Eardrum redness likely from coughing. Flu and COVID testing considered, antivirals not indicated. - Perform flu and COVID testing which was negative. - Prescribe oral steroid pack. - Prescribe cough medicine if needed.  Chronic Obstructive Pulmonary Disease (COPD) Chronic smoker with symptoms suggesting COPD. Referral to pulmonology for further evaluation. Albuterol  inhaler discussed for symptom relief. - Refer to pulmonologist for sleep study and PFT. - Prescribe albuterol  inhaler for as-needed use.  Snoring and Suspected Sleep Apnea Loud snoring and symptoms suggest sleep apnea. Referral to pulmonologist for sleep study due to smoking history  and potential COPD. - Refer to pulmonologist for sleep study.  Smoking Cessation Continues smoking despite previous cessation attempts. Discussed impact on respiratory health and encouraged cessation options.  Lung Cancer Screening Eligible for screening due to smoking history. Agreed to annual low-dose CT scans for monitoring. - Order annual low-dose CT scan for lung cancer screening.  General Health Maintenance Routine screenings discussed. Mammogram order in place, needs scheduling. - Schedule mammogram.  Follow-up Plan for follow-up in three months for routine labs and respiratory evaluation. - Schedule follow-up appointment in three months for routine labs.  - POC Covid19/Flu A&B Antigen - Ambulatory referral to Pulmonology - methylPREDNISolone  (MEDROL  DOSEPAK) 4 MG TBPK tablet; Use as directed.  Dispense: 21 each; Refill: 0 - benzonatate  (TESSALON  PERLES) 100 MG capsule; Take 1 capsule (100 mg total) by mouth 3 (three) times daily as needed for cough.  Dispense: 20 capsule; Refill: 0 - POC Covid19/Flu A&B Antigen - Ambulatory referral to Pulmonology - albuterol  (VENTOLIN  HFA) 108 (90 Base) MCG/ACT inhaler; Inhale 2 puffs into the lungs every 6 (six) hours as needed for wheezing or shortness of breath.  Dispense: 8 g; Refill: 2 - Ambulatory Referral Lung Cancer Screening [facility]   Return in about 3 months (around 11/05/2023).    Rockney Cid, DO

## 2023-08-07 DIAGNOSIS — J029 Acute pharyngitis, unspecified: Secondary | ICD-10-CM | POA: Diagnosis not present

## 2023-08-07 DIAGNOSIS — R059 Cough, unspecified: Secondary | ICD-10-CM | POA: Diagnosis not present

## 2023-08-07 DIAGNOSIS — R590 Localized enlarged lymph nodes: Secondary | ICD-10-CM | POA: Diagnosis not present

## 2023-08-07 DIAGNOSIS — R509 Fever, unspecified: Secondary | ICD-10-CM | POA: Diagnosis not present

## 2023-08-13 DIAGNOSIS — M545 Low back pain, unspecified: Secondary | ICD-10-CM | POA: Diagnosis not present

## 2023-08-13 DIAGNOSIS — N390 Urinary tract infection, site not specified: Secondary | ICD-10-CM | POA: Diagnosis not present

## 2023-08-13 DIAGNOSIS — R109 Unspecified abdominal pain: Secondary | ICD-10-CM | POA: Diagnosis not present

## 2023-08-15 ENCOUNTER — Encounter: Payer: Self-pay | Admitting: Internal Medicine

## 2023-08-15 ENCOUNTER — Ambulatory Visit: Admitting: Internal Medicine

## 2023-08-15 ENCOUNTER — Ambulatory Visit: Payer: Self-pay

## 2023-08-15 ENCOUNTER — Other Ambulatory Visit: Payer: Self-pay

## 2023-08-15 VITALS — BP 124/72 | HR 98 | Temp 98.0°F | Resp 18 | Ht 69.0 in | Wt 192.3 lb

## 2023-08-15 DIAGNOSIS — J069 Acute upper respiratory infection, unspecified: Secondary | ICD-10-CM

## 2023-08-15 DIAGNOSIS — S2341XA Sprain of ribs, initial encounter: Secondary | ICD-10-CM | POA: Diagnosis not present

## 2023-08-15 DIAGNOSIS — R051 Acute cough: Secondary | ICD-10-CM

## 2023-08-15 MED ORDER — HYDROCOD POLI-CHLORPHE POLI ER 10-8 MG/5ML PO SUER: 5.0000 mL | Freq: Two times a day (BID) | ORAL | 0 refills | Status: AC | PRN

## 2023-08-15 NOTE — Patient Instructions (Addendum)
 It was great seeing you today!  Plan discussed at today's visit: -Take cough medication at bedtime  -Use nasal saline and nasal steroid spray 2 sprays on each side twice a day to help with sinus/ear symptoms (fluticasone  generic - Flonase  brand) -For rib pain, can take Ibuprofen  or use Lidocaine  patch for pain    Follow up in: as needed   Take care and let us  know if you have any questions or concerns prior to your next visit.  Dr. Bud Care

## 2023-08-15 NOTE — Telephone Encounter (Signed)
 FYI Only or Action Required?: FYI only for provider  Patient was last seen in primary care on 08/05/2023 by Rockney Cid, DO. Called Nurse Triage reporting Chest Pain and Cough. Symptoms began a week ago. Interventions attempted: OTC medications:   and Prescription medications: doxycycline  and inhaler, tessalon  pearls. . Symptoms are: gradually worsening.  Triage Disposition: See HCP Within 4 Hours (Or PCP Triage)  Patient/caregiver understands and will follow disposition?: Yes     Copied from CRM [id number]. Topic: Clinical - Red Word Triage >> Aug 15, 2023  8:02 AM Elle L wrote: Red Word that prompted transfer to Nurse Triage: The patient was seen in the office on 6/6 for a sinus and respiratory infection and then the patient went to urgent care on 6/7. The patient states her left ribs are hurting and she is concerned that she may have pneumonia. Reason for Disposition  [1] MILD difficulty breathing (e.g., minimal/no SOB at rest, SOB with walking, pulse <100) AND [2] still present when not coughing  Answer Assessment - Initial Assessment Questions 1. ONSET: When did the cough begin?      Over a week ago 2. SEVERITY: How bad is the cough today?      mild 3. SPUTUM: Describe the color of your sputum (none, dry cough; clear, white, yellow, green)     no 4. HEMOPTYSIS: Are you coughing up any blood? If so ask: How much? (flecks, streaks, tablespoons, etc.)     no 5. DIFFICULTY BREATHING: Are you having difficulty breathing? If Yes, ask: How bad is it? (e.g., mild, moderate, severe)    - MILD: No SOB at rest, mild SOB with walking, speaks normally in sentences, can lie down, no retractions, pulse < 100.    - MODERATE: SOB at rest, SOB with minimal exertion and prefers to sit, cannot lie down flat, speaks in phrases, mild retractions, audible wheezing, pulse 100-120.    - SEVERE: Very SOB at rest, speaks in single words, struggling to breathe, sitting hunched forward,  retractions, pulse > 120      mod 6. FEVER: Do you have a fever? If Yes, ask: What is your temperature, how was it measured, and when did it start?     no 7. CARDIAC HISTORY: Do you have any history of heart disease? (e.g., heart attack, congestive heart failure)      no 8. LUNG HISTORY: Do you have any history of lung disease?  (e.g., pulmonary embolus, asthma, emphysema)     no 9. PE RISK FACTORS: Do you have a history of blood clots? (or: recent major surgery, recent prolonged travel, bedridden)     no 10. OTHER SYMPTOMS: Do you have any other symptoms? (e.g., runny nose, wheezing, chest pain)       Congestion, sweats, rib pain on left side  Protocols used: Cough - Acute Non-Productive-A-AH

## 2023-08-15 NOTE — Progress Notes (Signed)
 Acute Office Visit  Subjective:     Patient ID: Sharon Wright, female    DOB: Nov 19, 1960, 63 y.o.   MRN: [id number]  Chief Complaint  Patient presents with   Cough    For 3 weeks   Ear Fullness    Cough Associated symptoms include ear pain. Pertinent negatives include no chills, fever, sore throat, shortness of breath or wheezing.  Ear Fullness  Associated symptoms include coughing. Pertinent negatives include no sore throat.   Patient is in today for cough and ear fullness x 3 weeks.  Discussed the use of AI scribe software for clinical note transcription with the patient, who gave verbal consent to proceed.  History of Present Illness Sharon Wright is a 63 year old female who presents with rib pain and persistent cough.  She has experienced severe rib pain since Saturday, initially suspecting a urinary tract infection, but no infection was found. The pain is less associated with coughing now than at the onset. She has been treated with doxycycline  100 mg twice daily for seven days starting on the seventh, but symptoms persist. Tessalon  Perles has been used for cough management without significant relief.  She experiences ear fullness and popping, noted again this morning. She uses Claritin  D for congestion and an over-the-counter nasal spray from [facility]. A chest x-ray performed on the seventh was clear. She has occasional night sweats and uses a rescue inhaler, though its effectiveness is uncertain. She has no fever, wheezing, or fluid in the lungs.     Review of Systems  Constitutional:  Negative for chills and fever.  HENT:  Positive for ear pain. Negative for congestion, sinus pain and sore throat.   Respiratory:  Positive for cough. Negative for sputum production, shortness of breath and wheezing.         Objective:    BP 124/72 (Cuff Size: Large)   Pulse 98   Temp 98 F (36.7 C) (Oral)   Resp 18   Ht 5' 9 (1.753 m)   Wt 192 lb 4.8 oz (87.2 kg)   SpO2  98%   BMI 28.40 kg/m    Physical Exam Constitutional:      Appearance: Normal appearance.  HENT:     Head: Normocephalic and atraumatic.     Right Ear: Tympanic membrane, ear canal and external ear normal.     Left Ear: Tympanic membrane, ear canal and external ear normal.     Mouth/Throat:     Mouth: Mucous membranes are moist.     Pharynx: Oropharynx is clear.   Eyes:     Conjunctiva/sclera: Conjunctivae normal.    Cardiovascular:     Rate and Rhythm: Normal rate and regular rhythm.  Pulmonary:     Effort: Pulmonary effort is normal.     Breath sounds: No wheezing, rhonchi or rales.     Comments: Decreased air movement throughout  Skin:    General: Skin is warm and dry.   Neurological:     General: No focal deficit present.     Mental Status: She is alert. Mental status is at baseline.   Psychiatric:        Mood and Affect: Mood normal.        Behavior: Behavior normal.     No results found for any visits on 08/15/23.      Assessment & Plan:   Assessment & Plan Musculoskeletal chest pain Severe rib pain likely due to musculoskeletal strain from coughing or straining intercostal  muscles. Differential includes pneumonia, but recent chest x-ray was clear, and she has no fever. Pain likely from muscle strain as intercostal muscles are thin and easily strained. - Advise ibuprofen , two tablets twice a day, for pain relief. - Recommend Voltaren gel for topical application to relax the muscle. - Suggest using a lidocaine  patch for localized pain relief. - Prescribe codeine cough syrup at bedtime to help control the cough and reduce rib pain. - Instruct to call if symptoms persist by the end of the week for a possible chest x-ray.  Post-viral cough Persistent cough following recent illness, likely post-viral, may last up to six to eight weeks. Previously treated with doxycycline  and steroids, covering potential bacterial infections like pneumonia. Current focus on  managing cough to alleviate symptoms. - Prescribe codeine cough syrup at bedtime to help control the cough. - Recommend Mucinex  (600 mg) in the morning to help break up mucus and facilitate coughing it up. - Advise against taking Mucinex  before bed to avoid nighttime coughing.  Ear fullness Ear fullness and popping likely related to sinus congestion. Examination shows clear ears, no infection. Symptoms likely due to sinus issues. - Recommend nasal saline spray to thin mucus and rinse the sinuses. - Advise nasal steroid spray, such as fluticasone , to reduce inflammation and congestion.  Follow-up Emphasized importance of monitoring symptoms and following up if she persists. If symptoms persist by the end of the week, a chest x-ray may be necessary to rule out pneumonia, considering the low risk of development given previous treatment and lack of fever. - Instruct to call if symptoms persist by the end of the week for a possible chest x-ray. - Follow up with a pulmonologist in August.  - chlorpheniramine-HYDROcodone  (TUSSIONEX) 10-8 MG/5ML; Take 5 mLs by mouth every 12 (twelve) hours as needed for up to 5 days for cough.  Dispense: 50 mL; Refill: 0   Return if symptoms worsen or fail to improve.  Rockney Cid, DO

## 2023-08-22 ENCOUNTER — Telehealth: Payer: Self-pay | Admitting: Acute Care

## 2023-08-22 DIAGNOSIS — F17210 Nicotine dependence, cigarettes, uncomplicated: Secondary | ICD-10-CM

## 2023-08-22 DIAGNOSIS — Z87891 Personal history of nicotine dependence: Secondary | ICD-10-CM

## 2023-08-22 DIAGNOSIS — Z122 Encounter for screening for malignant neoplasm of respiratory organs: Secondary | ICD-10-CM

## 2023-08-22 NOTE — Telephone Encounter (Signed)
 Lung Cancer Screening Narrative/Criteria Questionnaire (Cigarette Smokers Only- No Cigars/Pipes/vapes)   Sharon Wright   SDMV:09/06/2023 8:30a Kristen         February 11, 1961   LDCT: 09/07/2023 4:00pm [facility]    63 y.o.   Phone: [phone]  Lung Screening Narrative (confirm age 68-77 yrs Medicare / 50-80 yrs Private pay insurance)   Insurance information:[employer]   Referring Provider:Dr. Sharyle Marsh   This screening involves an initial phone call with a team member from our program. It is called a shared decision making visit. The initial meeting is required by  insurance and Medicare to make sure you understand the program. This appointment takes about 15-20 minutes to complete. You will complete the screening scan at your scheduled date/time.  This scan takes about 5-10 minutes to complete. You can eat and drink normally before and after the scan.  Criteria questions for Lung Cancer Screening:   Are you a current or former smoker? Current Age began smoking: 63yo   If you are a former smoker, what year did you quit smoking? N/A(within 15 yrs)   To calculate your smoking history, I need an accurate estimate of how many packs of cigarettes you smoked per day and for how many years. (Not just the number of PPD you are now smoking)   Years smoking 48 x Packs per day 1 = Pack years 48   (at least 20 pack yrs)   (Make sure they understand that we need to know how much they have smoked in the past, not just the number of PPD they are smoking now)  Do you have a personal history of cancer?  No    Do you have a family history of cancer? No  Are you coughing up blood?  No  Have you had unexplained weight loss of 15 lbs or more in the last 6 months? No  It looks like you meet all criteria.  When would be a good time for us  to schedule you for this screening?   Additional information: N/A

## 2023-09-06 ENCOUNTER — Encounter: Payer: Self-pay | Admitting: *Deleted

## 2023-09-06 ENCOUNTER — Ambulatory Visit (INDEPENDENT_AMBULATORY_CARE_PROVIDER_SITE_OTHER): Admitting: *Deleted

## 2023-09-06 DIAGNOSIS — F17210 Nicotine dependence, cigarettes, uncomplicated: Secondary | ICD-10-CM

## 2023-09-06 NOTE — Progress Notes (Signed)
 Virtual Visit via Telephone Note  I connected with Sharon Wright on 09/06/23 at  8:30 AM EDT by telephone and verified that I am speaking with the correct person using two identifiers.  Location: Patient: in home Provider: [address], [place], [place], [address]   Screening Cancer Screening Program [phone])   Eligibility: Age 63 y.o. Pack Years Smoking History Calculation 40 (# packs/per year x # years smoked) Recent History of coughing up blood  no Unexplained weight loss? no ( >Than 15 pounds within the last 6 months ) Prior History Lung / other cancer no (Diagnosis within the last 5 years already requiring surveillance chest CT Scans). Smoking Status Current Smoker Former Smokers: Years since quit: NA  Quit Date: NA  Visit Components: Discussion included one or more decision making aids. yes Discussion included risk/benefits of screening. yes Discussion included potential follow up diagnostic testing for abnormal scans. yes Discussion included meaning and risk of over diagnosis. yes Discussion included meaning and risk of False Positives. yes Discussion included meaning of total radiation exposure. yes  Counseling Included: Importance of adherence to annual lung cancer LDCT screening. yes Impact of comorbidities on ability to participate in the program. yes Ability and willingness to under diagnostic treatment. yes  Smoking Cessation Counseling: Current Smokers:  Discussed importance of smoking cessation. yes Information about tobacco cessation classes and interventions provided to patient. yes Patient provided with ticket for LDCT Scan. yes Symptomatic Patient. no  Counseling NA Diagnosis Code: Tobacco Use Z72.0 Asymptomatic Patient yes  Counseling (Intermediate counseling: > three minutes counseling) H9563 Former Smokers:  Discussed the importance of maintaining cigarette abstinence. yes Diagnosis Code: Personal History of Nicotine Dependence.  S12.108 Information about tobacco cessation classes and interventions provided to patient. Yes Patient provided with ticket for LDCT Scan. yes Written Order for Lung Cancer Screening with LDCT placed in Epic. Yes (CT Chest Lung Cancer Screening Low Dose W/O CM) [id number] Z12.2-Screening of respiratory organs Z87.891-Personal history of nicotine dependence   Josette Ranger, RN 09/06/23

## 2023-09-06 NOTE — Patient Instructions (Signed)

## 2023-09-07 ENCOUNTER — Ambulatory Visit (HOSPITAL_BASED_OUTPATIENT_CLINIC_OR_DEPARTMENT_OTHER)
Admission: RE | Admit: 2023-09-07 | Discharge: 2023-09-07 | Disposition: A | Discharge status: Home / Self Care | Source: Ambulatory Visit | Attending: Acute Care | Admitting: Acute Care

## 2023-09-07 DIAGNOSIS — Z87891 Personal history of nicotine dependence: Secondary | ICD-10-CM | POA: Insufficient documentation

## 2023-09-07 DIAGNOSIS — Z122 Encounter for screening for malignant neoplasm of respiratory organs: Secondary | ICD-10-CM | POA: Diagnosis not present

## 2023-09-07 DIAGNOSIS — F17210 Nicotine dependence, cigarettes, uncomplicated: Secondary | ICD-10-CM | POA: Diagnosis not present

## 2023-09-16 ENCOUNTER — Other Ambulatory Visit: Payer: Self-pay

## 2023-09-16 DIAGNOSIS — F17210 Nicotine dependence, cigarettes, uncomplicated: Secondary | ICD-10-CM

## 2023-09-16 DIAGNOSIS — Z122 Encounter for screening for malignant neoplasm of respiratory organs: Secondary | ICD-10-CM

## 2023-09-16 DIAGNOSIS — Z87891 Personal history of nicotine dependence: Secondary | ICD-10-CM

## 2023-10-20 ENCOUNTER — Encounter: Payer: Self-pay | Admitting: Internal Medicine

## 2023-10-20 ENCOUNTER — Ambulatory Visit: Admitting: Internal Medicine

## 2023-10-20 VITALS — BP 130/80 | HR 65 | Temp 98.2°F | Ht 69.0 in | Wt 192.0 lb

## 2023-10-20 DIAGNOSIS — Z122 Encounter for screening for malignant neoplasm of respiratory organs: Secondary | ICD-10-CM

## 2023-10-20 DIAGNOSIS — J449 Chronic obstructive pulmonary disease, unspecified: Secondary | ICD-10-CM | POA: Diagnosis not present

## 2023-10-20 DIAGNOSIS — F17210 Nicotine dependence, cigarettes, uncomplicated: Secondary | ICD-10-CM

## 2023-10-20 DIAGNOSIS — G4733 Obstructive sleep apnea (adult) (pediatric): Secondary | ICD-10-CM | POA: Diagnosis not present

## 2023-10-20 MED ORDER — NICOTINE 21 MG/24HR TD PT24: 21.0000 mg | MEDICATED_PATCH | TRANSDERMAL | 1 refills | Status: DC

## 2023-10-20 NOTE — Progress Notes (Unsigned)
 Name: Sharon Wright MRN: [id number] DOB: 1960-08-15    CHIEF COMPLAINT:  ASSESSMENT OF SLEEP APNEA EXCESSIVE DAYTIME SLEEPINESS Assessment of COPD Tobacco abuse   HISTORY OF PRESENT ILLNESS: Patient is seen today for problems and issues with sleep related to excessive daytime sleepiness Patient  has been having sleep problems for many years Patient has been having excessive daytime sleepiness for a long time Patient has been having extreme fatigue and tiredness, lack of energy +  very Loud snoring every night + struggling breathe at night and gasps for air + morning headaches + Nonrefreshing sleep  Discussed sleep data and reviewed with patient.  Encouraged proper weight management.  Discussed driving precautions and its relationship with hypersomnolence.  Discussed operating dangerous equipment and its relationship with hypersomnolence.  Discussed sleep hygiene, and benefits of a fixed sleep waked time.  The importance of getting eight or more hours of sleep discussed with patient.  Discussed limiting the use of the computer and television before bedtime.  Decrease naps during the day, so night time sleep will become enhanced.  Limit caffeine, and sleep deprivation.  HTN, stroke, and heart failure are potential risk factors.       10/20/2023    3:00 PM  Results of the Epworth flowsheet  Sitting and reading 2  Watching TV 3  Sitting, inactive in a public place (e.g. a theatre or a meeting) 1  As a passenger in a car for an hour without a break 2  Lying down to rest in the afternoon when circumstances permit 3  Sitting and talking to someone 1  Sitting quietly after a lunch without alcohol 3  In a car, while stopped for a few minutes in traffic 1  Total score 16   Assessment of COPD Patient is an active smoker 1 pack a day for the last 50 years  Patient with intermittent shortness of breath using albuterol  inhaler as needed Patient is not on any maintenance  therapy No exacerbation at this time No evidence of heart failure at this time No evidence or signs of infection at this time No respiratory distress No fevers, chills, nausea, vomiting, diarrhea No evidence of lower extremity edema No evidence hemoptysis  Assessment of low-dose CT chest lung cancer screening Patient with subcentimeter nodules in the right upper lobe seems to be benign at this time there was report that this was compared to 2019 no significant changes There are some mild changes of emphysema Findings relayed to patient in detail  PAST MEDICAL HISTORY :   has a past medical history of Arthritis, Gross hematuria (06/29/2018), History of kidney stones, Microscopic hematuria, Pelvic mass in female, Pelvic mass in female (01/11/2018), and Tobacco abuse (03/22/2017).  has a past surgical history that includes back syugery (1993); Benign tumor (1993); Skin surgery (2008); Bunionectomy (Left, 2006); Lithotripsy; Robotic assisted bilateral salpingo oophorectomy (Bilateral, 02/06/2018); Cystoscopy/ureteroscopy/holmium laser/stent placement (Right, 08/11/2018); Colonoscopy with propofol  (N/A, 04/17/2019); polypectomy (04/17/2019); and Extracorporeal shock wave lithotripsy (Right, 04/17/2020). Prior to Admission medications   Medication Sig Start Date End Date Taking? Authorizing Provider  acetaminophen  (TYLENOL ) 650 MG CR tablet Take 650 mg by mouth every 8 (eight) hours as needed for pain.   Yes [provider]  albuterol  (VENTOLIN  HFA) 108 (90 Base) MCG/ACT inhaler Inhale 2 puffs into the lungs every 6 (six) hours as needed for wheezing or shortness of breath. 08/05/23  Yes Bernardo Fend, DO  cholecalciferol (VITAMIN D) 1000 UNITS tablet Take 1,000 Units by mouth daily.  Yes [provider]   Allergies  Allergen Reactions   Oxycodone  Nausea And Vomiting   Penicillins Rash and Other (See Comments)    Childhood allergy rash after 3 days. No SOB, no  hospitilization Has patient had a PCN reaction causing immediate rash, facial/tongue/throat swelling, SOB or lightheadedness with hypotension: No Has patient had a PCN reaction causing severe rash involving mucus membranes or skin necrosis: No Has patient had a PCN reaction that required hospitalization: No Has patient had a PCN reaction occurring within the last 10 years: No If all of the above answers are NO, then may proceed with Cephalos    FAMILY HISTORY:  family history includes Congestive Heart Failure in her mother; Dementia in her father; Diabetes in her father and sister; Heart disease in her father, mother, and sister; Uterine cancer in her mother. SOCIAL HISTORY:  reports that she has been smoking cigarettes. She has a 40 pack-year smoking history. She has never used smokeless tobacco. She reports that she does not drink alcohol and does not use drugs.  BP 130/80   Pulse 65   Temp 98.2 F (36.8 C)   Ht 5' 9 (1.753 m)   Wt 192 lb (87.1 kg)   SpO2 96%   BMI 28.35 kg/m      Review of Systems: Gen:  Denies  fever, sweats, chills weight loss  HEENT: Denies blurred vision, double vision, ear pain, eye pain, hearing loss, nose bleeds, sore throat Cardiac:  No dizziness, chest pain or heaviness, chest tightness,edema, No JVD Resp:   No cough, -sputum production, -shortness of breath,-wheezing, -hemoptysis,  Other:  All other systems negative   Physical Examination:   General Appearance: No distress  EYES PERRLA, EOM intact.   NECK Supple, No JVD Pulmonary: normal breath sounds, No wheezing.  CardiovascularNormal S1,S2.  No m/r/g.   Abdomen: Benign, Soft, non-tender. Neurology UE/LE 5/5 strength, no focal deficits Ext pulses intact, cap refill intact ALL OTHER ROS ARE NEGATIVE     ASSESSMENT AND PLAN SYNOPSIS  Patient with signs and symptoms of excessive daytime sleepiness with probable underlying diagnosis of obstructive sleep apnea in the setting of obesity  and deconditioned state, in the setting of active tobacco abuse with underlying mild emphysematous changes on CT scan found on low-dose CT chest   Assessment & Plan OSA (obstructive sleep apnea) Recommend Home Sleep Study for definitve diagnosis Cigarette smoker Smoking Assessment and Cessation Counseling Upon further questioning, Patient smokes 1 ppd I have advised patient to quit/stop smoking as soon as possible due to high risk for multiple medical problems Patient is willing to quit smoking  I have advised patient that we can assist and have options of Nicotine  replacement therapy. I also advised patient on behavioral therapy and can provide oral medication therapy in conjunction with the other therapies Follow up next Office visit  for assessment of smoking cessation Smoking cessation counseling advised for >10 minutes Nicotine  patches prescribed  Chronic obstructive pulmonary disease, unspecified COPD type (HCC) No exacerbation at this time No evidence of heart failure at this time No evidence or signs of infection at this time No respiratory distress No fevers, chills, nausea, vomiting, diarrhea No evidence of lower extremity edema No evidence hemoptysis Avoid Allergens and Irritants Avoid secondhand smoke Avoid SICK contacts Recommend  Masking  when appropriate Recommend Keep up-to-date with vaccinations Use albuterol  as needed No maintainance therapy at this time Recommend PFT's for further evaluation  Screening for malignant neoplasm of respiratory organ Low-dose CT  chest July 2025 reviewed in detail with patient today Evidence of emphysema no evidence of significant abnormalities consistent with cancer however there are some subcentimeter nodules Radiology recommends follow-up CT chest annually   MEDICATION ADJUSTMENTS/LABS AND TESTS ORDERED:  Home Sleep Study Obtain PFTs Low-dose CT chest annually Nicotine  patches  CURRENT MEDICATIONS REVIEWED AT LENGTH WITH  PATIENT TODAY   Patient  satisfied with Plan of action and management. All questions answered   Follow up 3 months   I spent a total of 62 minutes dedicated to the care of this patient on the date of this encounter to include pre-visit review of records, face-to-face time with the patient discussing conditions above, post visit ordering of testing, clinical documentation with the electronic health record, making appropriate referrals as documented, and communicating necessary information to the patient's healthcare team.     Nickolas Alm Cellar, M.D.  [facility]  Medical Director [facility] Medical Director [facility] Cardio-Pulmonary Department

## 2023-10-20 NOTE — Patient Instructions (Signed)
 Please stop smoking Start nicotine  patches as prescribed Recommend pulmonary function test to assess lung function Recommend home sleep test to assess for sleep apnea Follow-up lung cancer screening protocol annually  Avoid Allergens and Irritants Avoid secondhand smoke Avoid SICK contacts Recommend  Masking  when appropriate Recommend Keep up-to-date with vaccinations

## 2023-11-11 ENCOUNTER — Ambulatory Visit (INDEPENDENT_AMBULATORY_CARE_PROVIDER_SITE_OTHER): Admitting: Internal Medicine

## 2023-11-11 ENCOUNTER — Other Ambulatory Visit: Payer: Self-pay

## 2023-11-11 ENCOUNTER — Encounter: Payer: Self-pay | Admitting: Internal Medicine

## 2023-11-11 VITALS — BP 120/70 | HR 77 | Temp 97.7°F | Resp 16 | Ht 69.0 in | Wt 187.7 lb

## 2023-11-11 DIAGNOSIS — I700 Atherosclerosis of aorta: Secondary | ICD-10-CM

## 2023-11-11 DIAGNOSIS — Z13220 Encounter for screening for lipoid disorders: Secondary | ICD-10-CM | POA: Diagnosis not present

## 2023-11-11 DIAGNOSIS — Z1231 Encounter for screening mammogram for malignant neoplasm of breast: Secondary | ICD-10-CM

## 2023-11-11 DIAGNOSIS — M1990 Unspecified osteoarthritis, unspecified site: Secondary | ICD-10-CM

## 2023-11-11 DIAGNOSIS — H9193 Unspecified hearing loss, bilateral: Secondary | ICD-10-CM

## 2023-11-11 DIAGNOSIS — E559 Vitamin D deficiency, unspecified: Secondary | ICD-10-CM | POA: Diagnosis not present

## 2023-11-11 DIAGNOSIS — D229 Melanocytic nevi, unspecified: Secondary | ICD-10-CM

## 2023-11-11 DIAGNOSIS — Z0000 Encounter for general adult medical examination without abnormal findings: Secondary | ICD-10-CM | POA: Diagnosis not present

## 2023-11-11 LAB — COMPREHENSIVE METABOLIC PANEL WITH GFR
AG Ratio: 2 (calc) (ref 1.0–2.5)
ALT: 19 U/L (ref 6–29)
AST: 17 U/L (ref 10–35)
Albumin: 4.3 g/dL (ref 3.6–5.1)
Alkaline phosphatase (APISO): 76 U/L (ref 37–153)
BUN: 20 mg/dL (ref 7–25)
CO2: 28 mmol/L (ref 20–32)
Calcium: 9.7 mg/dL (ref 8.6–10.4)
Chloride: 107 mmol/L (ref 98–110)
Creat: 0.51 mg/dL (ref 0.50–1.05)
Globulin: 2.2 g/dL (ref 1.9–3.7)
Glucose, Bld: 86 mg/dL (ref 65–99)
Potassium: 3.8 mmol/L (ref 3.5–5.3)
Sodium: 142 mmol/L (ref 135–146)
Total Bilirubin: 0.3 mg/dL (ref 0.2–1.2)
Total Protein: 6.5 g/dL (ref 6.1–8.1)
eGFR: 105 mL/min/1.73m2 (ref >=60)

## 2023-11-11 LAB — CBC WITH DIFFERENTIAL/PLATELET
Absolute Lymphocytes: 3591 {cells}/uL (ref 850–3900)
Absolute Monocytes: 608 {cells}/uL (ref 200–950)
Basophils Absolute: 67 {cells}/uL (ref 0–200)
Basophils Relative: 0.7 %
Eosinophils Absolute: 285 {cells}/uL (ref 15–500)
Eosinophils Relative: 3 %
HCT: 44.7 % (ref 35.0–45.0)
Hemoglobin: 15 g/dL (ref 11.7–15.5)
MCH: 30.5 pg (ref 27.0–33.0)
MCHC: 33.6 g/dL (ref 32.0–36.0)
MCV: 91 fL (ref 80.0–100.0)
MPV: 10.2 fL (ref 7.5–12.5)
Monocytes Relative: 6.4 %
Neutro Abs: 4950 {cells}/uL (ref 1500–7800)
Neutrophils Relative %: 52.1 %
Platelets: 272 Thousand/uL (ref 140–400)
RBC: 4.91 Million/uL (ref 3.80–5.10)
RDW: 12.6 % (ref 11.0–15.0)
Total Lymphocyte: 37.8 %
WBC: 9.5 Thousand/uL (ref 3.8–10.8)

## 2023-11-11 LAB — VITAMIN D 25 HYDROXY (VIT D DEFICIENCY, FRACTURES): Vit D, 25-Hydroxy: 41 ng/mL (ref 30–100)

## 2023-11-11 LAB — LIPID PANEL
Cholesterol: 181 mg/dL (ref <200)
HDL: 58 mg/dL (ref >=50)
LDL Cholesterol (Calc): 104 mg/dL — ABNORMAL HIGH
Non-HDL Cholesterol (Calc): 123 mg/dL (ref <130)
Total CHOL/HDL Ratio: 3.1 (calc) (ref <5.0)
Triglycerides: 92 mg/dL (ref <150)

## 2023-11-11 NOTE — Progress Notes (Signed)
 Established Patient Office Visit  Subjective   Patient ID: Sharon Wright, female    DOB: 1961/02/09  Age: 63 y.o. MRN: [id number]  Chief Complaint  Patient presents with   Medical Management of Chronic Issues    3 month recheck    HPI  Patient is here for follow up on chronic medical conditions.   Discussed the use of AI scribe software for clinical note transcription with the patient, who gave verbal consent to proceed.  History of Present Illness The patient is a 63 year old who presents for routine blood work and follow-up on multiple health concerns.  She is due for routine blood work to assess kidney and liver function, electrolytes, anemia, cholesterol, and vitamin D  levels. She consumed Atkins chocolate and Nature's Twist orange drink prior to the visit.  Hip pain occasionally flares up but resolves spontaneously. She plans to schedule a mammogram and consult a dermatologist for a recurring skin issue previously treated by excision.  She has failed a hearing test twice with worsening high-frequency hearing. She has ordered a sleep apnea device and is in contact with pulmonary specialists.  She is attempting weight loss and reducing her smoking habit. Her lungs are in better condition than expected for a long-term smoker.  She needs a colonoscopy but is concerned about transportation logistics post-procedure. Her husband may assist with this.  Aortic atherosclerosis was noted on previous scans, but cholesterol levels were good last year. She carries an inhaler prescribed in June but uses it sparingly.  She is cautious about visiting her sister in a skilled nursing facility due to a COVID-19 outbreak and considers wearing a mask and gloves for protection.   Elevated blood pressure reading without diagnosis of hypertension: -Medications: Nothing currently -Highest BP at home: not checking -Denies any SOB, CP, vision changes, LE edema or symptoms of hypotension.  Occasional headaches   The 10-year ASCVD risk score (Arnett DK, et al., 2019) is: 5.7%   Values used to calculate the score:     Age: 35 years     Clincally relevant sex: Female     Is Non-Hispanic African American: No     Diabetic: No     Tobacco smoker: Yes     Systolic Blood Pressure: 120 mmHg     Is BP treated: No     HDL Cholesterol: 71 mg/dL     Total Cholesterol: 176 mg/dL  Health maintenance: -Blood work due -Mammogram ordered -Declines flu vaccine    Review of Systems  All other systems reviewed and are negative.     Objective:     BP 120/70 (Cuff Size: Large)   Pulse 77   Temp 97.7 F (36.5 C) (Oral)   Resp 16   Ht 5' 9 (1.753 m)   Wt 187 lb 11.2 oz (85.1 kg)   SpO2 99%   BMI 27.72 kg/m  BP Readings from Last 3 Encounters:  11/11/23 120/70  10/20/23 130/80  08/15/23 124/72   Wt Readings from Last 3 Encounters:  11/11/23 187 lb 11.2 oz (85.1 kg)  10/20/23 192 lb (87.1 kg)  08/15/23 192 lb 4.8 oz (87.2 kg)      Physical Exam Constitutional:      Appearance: Normal appearance.  HENT:     Head: Normocephalic and atraumatic.  Eyes:     Conjunctiva/sclera: Conjunctivae normal.  Cardiovascular:     Rate and Rhythm: Normal rate and regular rhythm.  Pulmonary:     Effort: Pulmonary effort is normal.  Breath sounds: Normal breath sounds.  Musculoskeletal:     Right lower leg: No edema.     Left lower leg: No edema.  Skin:    General: Skin is warm and dry.     Comments: Atypical skin lesion on chest, see picture below   Neurological:     General: No focal deficit present.     Mental Status: She is alert. Mental status is at baseline.  Psychiatric:        Mood and Affect: Mood normal.        Behavior: Behavior normal.      No results found for any visits on 11/11/23.  Last CBC Lab Results  Component Value Date   WBC 8.6 11/12/2022   HGB 15.5 11/12/2022   HCT 46.3 (H) 11/12/2022   MCV 91.5 11/12/2022   MCH 30.6 11/12/2022    RDW 12.7 11/12/2022   PLT 248 11/12/2022   Last metabolic panel Lab Results  Component Value Date   GLUCOSE 86 11/12/2022   NA 138 11/12/2022   K 4.3 11/12/2022   CL 104 11/12/2022   CO2 26 11/12/2022   BUN 16 11/12/2022   CREATININE 0.56 11/12/2022   GFRNONAA >60 01/22/2019   CALCIUM 9.3 11/12/2022   PROT 6.3 11/12/2022   ALBUMIN 3.9 01/22/2019   BILITOT 0.3 11/12/2022   ALKPHOS 71 01/22/2019   AST 18 11/12/2022   ALT 19 11/12/2022   ANIONGAP 9 01/22/2019   Last lipids Lab Results  Component Value Date   CHOL 176 11/12/2022   HDL 71 11/12/2022   LDLCALC 86 11/12/2022   TRIG 101 11/12/2022   CHOLHDL 2.5 11/12/2022   Last hemoglobin A1c No results found for: HGBA1C Last thyroid functions No results found for: TSH, T3TOTAL, T4TOTAL, THYROIDAB Last vitamin D  No results found for: 25OHVITD2, 25OHVITD3, VD25OH Last vitamin B12 and Folate No results found for: VITAMINB12, FOLATE    The 10-year ASCVD risk score (Arnett DK, et al., 2019) is: 5.7%    Assessment & Plan:   Assessment and Plan Assessment & Plan Nicotine  dependence, current smoker Current smoker with reduced smoking. Discussed benefits of quitting and long-term lung health effects. - Encourage smoking cessation and discuss strategies.  Overweight Weight decreased by 5 pounds since August. Discussed importance of weight management and healthy lifestyle. - Continue weight management efforts and monitor progress.  Aortic atherosclerosis Aortic atherosclerosis noted on previous imaging. Discussed monitoring cholesterol levels to manage risk. - Check cholesterol levels with current blood work. - Consider medication if cholesterol levels indicate need.  Sleep apnea, under evaluation Sleep apnea under evaluation with pulmonary specialist. Discussed ongoing evaluation and management. - Continue with sleep apnea evaluation and management with pulmonary specialist.  Osteoarthritis,  unspecified site Intermittent flare-ups. Discussed management strategies and monitoring for worsening symptoms. - Monitor symptoms and consider further evaluation if symptoms worsen.  Hearing loss, high frequency High-frequency hearing loss noted. Discussed impact on communication and potential need for further evaluation.  Changing Skin Lesion Discussed potential for precancerous changes and need for dermatological evaluation. - Refer to dermatology for evaluation and management.  Vitamin D  deficiency, monitoring Monitoring vitamin D  levels. She takes supplements. Discussed ensuring levels are not too high. - Check vitamin D  levels with current blood work.  - CBC w/Diff/Platelet - Comprehensive Metabolic Panel (CMET) - Lipid Profile - Vitamin D  (25 hydroxy) - MM 3D SCREENING MAMMOGRAM BILATERAL BREAST; Future - Ambulatory referral to Dermatology   Return in about 6 months (around 05/10/2024).  Sharyle Fischer, DO

## 2023-11-14 ENCOUNTER — Ambulatory Visit: Payer: Self-pay | Admitting: Internal Medicine

## 2023-11-22 ENCOUNTER — Encounter

## 2023-11-22 DIAGNOSIS — G4733 Obstructive sleep apnea (adult) (pediatric): Secondary | ICD-10-CM

## 2023-11-30 DIAGNOSIS — G4733 Obstructive sleep apnea (adult) (pediatric): Secondary | ICD-10-CM | POA: Diagnosis not present

## 2023-12-01 ENCOUNTER — Ambulatory Visit: Payer: Self-pay

## 2023-12-28 ENCOUNTER — Ambulatory Visit (INDEPENDENT_AMBULATORY_CARE_PROVIDER_SITE_OTHER): Admitting: Family Medicine

## 2023-12-28 ENCOUNTER — Encounter: Payer: Self-pay | Admitting: Family Medicine

## 2023-12-28 VITALS — BP 123/72 | HR 58 | Temp 97.7°F | Ht 69.0 in | Wt 183.5 lb

## 2023-12-28 DIAGNOSIS — M5416 Radiculopathy, lumbar region: Secondary | ICD-10-CM | POA: Diagnosis not present

## 2023-12-28 DIAGNOSIS — M533 Sacrococcygeal disorders, not elsewhere classified: Secondary | ICD-10-CM

## 2023-12-28 MED ORDER — PREDNISONE 20 MG PO TABS: 40.0000 mg | ORAL_TABLET | Freq: Every day | ORAL | 0 refills | Status: AC

## 2023-12-28 MED ORDER — GABAPENTIN 100 MG PO CAPS: 100.0000 mg | ORAL_CAPSULE | Freq: Every day | ORAL | 0 refills | Status: AC

## 2023-12-28 NOTE — Patient Instructions (Signed)
 To keep you healthy, please keep in mind the following health maintenance items that you are due for:   Health Maintenance Due  Topic Date Due   Mammogram  Never done   Zoster Vaccines- Shingrix (1 of 2) Never done   COVID-19 Vaccine (3 - Pfizer risk series) 06/30/2019     Best Wishes,   Dr. Lang

## 2023-12-28 NOTE — Progress Notes (Signed)
 ACUTE VISIT   Patient: Sharon Wright   DOB: 02-Sep-1960   63 y.o. Female  MRN: [id number]   PCP: Bernardo Fend, DO  Chief Complaint  Patient presents with   Back Pain    Patient reports back pain bilateral near buttock and down legs. Is requesting to possibly do referral, has concerns for arthritis.  Patient does state that pain is a little better today than when she made the appointment.    Subjective    HPI HPI     Back Pain    Additional comments: Patient reports back pain bilateral near buttock and down legs. Is requesting to possibly do referral, has concerns for arthritis.  Patient does state that pain is a little better today than when she made the appointment.       Last edited by Cherry Chiquita HERO, CMA on 12/28/2023  3:57 PM.       Discussed the use of AI scribe software for clinical note transcription with the patient, who gave verbal consent to proceed.  History of Present Illness Sharon Wright is a 63 year old female who presents with bilateral back pain radiating down her legs.  She has been experiencing bilateral back pain near her buttocks, radiating down her legs, for approximately three weeks. The pain varies in location and is described as discomfort rather than severe pain. It is exacerbated by sitting and standing, particularly during prolonged periods at work, which involves physical activity. She has not engaged in any strenuous activities or experienced any injuries that could have triggered the pain.  She has a history of a ruptured disc 30 years ago, for which she underwent surgery. She describes the current pain as different from the past severe nerve pain she experienced, which she describes as 'squeezing' and 'breathtaking'. The worst pain she experienced recently was last week, rated as an eight out of ten, but she has been managing it with Tylenol  and ibuprofen . She also uses an arthritic rub before showering to alleviate  discomfort.  She reports difficulty sleeping due to thigh and buttock pain, and sometimes experiences difficulty rolling over in bed or getting up. She has previously refused additional surgery after her initial disc surgery and found relief with a series of steroid injections.  Her current medications include Tylenol , albuterol , and vitamin supplements. She is allergic to penicillin. She has not taken gabapentin  before but is familiar with it as her husband used it for back pain.  No recent falls or injuries. Nerve pain is more prominent on the right side, affecting her buttock and thigh.     Medications: Outpatient Medications Prior to Visit  Medication Sig   acetaminophen  (TYLENOL ) 650 MG CR tablet Take 650 mg by mouth daily.   albuterol  (VENTOLIN  HFA) 108 (90 Base) MCG/ACT inhaler Inhale 2 puffs into the lungs every 6 (six) hours as needed for wheezing or shortness of breath.   cholecalciferol (VITAMIN D ) 1000 UNITS tablet Take 1,000 Units by mouth daily.    No facility-administered medications prior to visit.    Last CBC Lab Results  Component Value Date   WBC 9.5 11/11/2023   HGB 15.0 11/11/2023   HCT 44.7 11/11/2023   MCV 91.0 11/11/2023   MCH 30.5 11/11/2023   RDW 12.6 11/11/2023   PLT 272 11/11/2023   Last metabolic panel Lab Results  Component Value Date   GLUCOSE 86 11/11/2023   NA 142 11/11/2023   K 3.8 11/11/2023   CL  107 11/11/2023   CO2 28 11/11/2023   BUN 20 11/11/2023   CREATININE 0.51 11/11/2023   EGFR 105 11/11/2023   CALCIUM 9.7 11/11/2023   PROT 6.5 11/11/2023   ALBUMIN 3.9 01/22/2019   BILITOT 0.3 11/11/2023   ALKPHOS 71 01/22/2019   AST 17 11/11/2023   ALT 19 11/11/2023   ANIONGAP 9 01/22/2019   Last lipids Lab Results  Component Value Date   CHOL 181 11/11/2023   HDL 58 11/11/2023   LDLCALC 104 (H) 11/11/2023   TRIG 92 11/11/2023   CHOLHDL 3.1 11/11/2023   Last hemoglobin A1c No results found for: HGBA1C Last thyroid  functions No results found for: TSH, T3TOTAL, T4TOTAL, FREET4, THYROIDAB Last vitamin D  Lab Results  Component Value Date   VD25OH 41 11/11/2023   Last vitamin B12 and Folate No results found for: VITAMINB12, FOLATE      Objective    BP 123/72 (BP Location: Left Arm, Patient Position: Sitting, Cuff Size: Normal)   Pulse (!) 58   Temp 97.7 F (36.5 C) (Oral)   Ht 5' 9 (1.753 m)   Wt 183 lb 8 oz (83.2 kg)   SpO2 97%   BMI 27.10 kg/m  BP Readings from Last 3 Encounters:  12/28/23 123/72  11/11/23 120/70  10/20/23 130/80   Wt Readings from Last 3 Encounters:  12/28/23 183 lb 8 oz (83.2 kg)  11/11/23 187 lb 11.2 oz (85.1 kg)  10/20/23 192 lb (87.1 kg)      Physical Exam   Physical Exam MUSCULOSKELETAL: Negative FADER and Faber tests bilaterally. Straight leg raise test positive on the right, negative on the left. 4/5 strength in bilateral lower extremities against resistance. Prominence in midline back. Well-healed lumbar surgical scar. Ambulating in spinal flexion position but independently. SKIN: No breaks, bleeding, oozing, or redness on spine visualization.   No results found for any visits on 12/28/23.  Assessment & Plan      Assessment & Plan Lumbosacral radiculopathy with low back and right leg pain Chronic low back pain with radicular symptoms radiating down the right leg, exacerbated by prolonged standing and certain movements. Pain is described as a squeezing sensation, with a history of ruptured disc and previous surgery. Differential includes degenerative disc disease and arthritis. Positive straight leg raise test on the right side. No recent trauma or falls. Pain management has been primarily with Tylenol  and ibuprofen , with some relief from topical arthritic rub. She is interested in non-surgical management options, including steroid injections, based on past positive outcomes with similar treatment. - Ordered lumbar spine x-ray to assess for  structural abnormalities. - Prescribed prednisone  40 mg daily for 7 days to reduce inflammation and pain. - Prescribed gabapentin  100 mg at bedtime to manage nerve pain, with counseling on sedative effects. - Referred to spine and scoliosis specialist, Dr. Donnajean Seltzer, for further evaluation and management. - Advised continuation of home exercises and stretching to maintain mobility and prevent stiffness.  Sacrococcygeal pain/disorder Chronic sacrococcygeal pain with discomfort from the top of the buttocks to the tailbone, exacerbated by prolonged sitting and standing. No recent trauma or falls. Pain is described as a deep discomfort, possibly related to previous lumbar surgery and degenerative changes. - Included sacrococcygeal region in lumbar spine x-ray to evaluate for any abnormalities. - Advised continuation of current pain management strategies, including Tylenol  and topical arthritic rub.      No follow-ups on file.        Rockie Agent, MD  [facility]  [facility] [phone] (phone) [phone] (fax)  [facility]

## 2023-12-30 ENCOUNTER — Ambulatory Visit
Admission: RE | Admit: 2023-12-30 | Discharge: 2023-12-30 | Disposition: A | Discharge status: Home / Self Care | Source: Ambulatory Visit | Attending: Family Medicine | Admitting: Family Medicine

## 2023-12-30 ENCOUNTER — Ambulatory Visit
Admission: RE | Admit: 2023-12-30 | Discharge: 2023-12-30 | Disposition: A | Discharge status: Home / Self Care | Attending: Family Medicine | Admitting: Family Medicine

## 2023-12-30 DIAGNOSIS — M48061 Spinal stenosis, lumbar region without neurogenic claudication: Secondary | ICD-10-CM | POA: Diagnosis not present

## 2023-12-30 DIAGNOSIS — M533 Sacrococcygeal disorders, not elsewhere classified: Secondary | ICD-10-CM | POA: Diagnosis not present

## 2023-12-30 DIAGNOSIS — I700 Atherosclerosis of aorta: Secondary | ICD-10-CM | POA: Diagnosis not present

## 2023-12-30 DIAGNOSIS — M5136 Other intervertebral disc degeneration, lumbar region with discogenic back pain only: Secondary | ICD-10-CM | POA: Diagnosis not present

## 2023-12-30 DIAGNOSIS — M5416 Radiculopathy, lumbar region: Secondary | ICD-10-CM

## 2023-12-30 DIAGNOSIS — M47816 Spondylosis without myelopathy or radiculopathy, lumbar region: Secondary | ICD-10-CM | POA: Diagnosis not present

## 2024-01-05 ENCOUNTER — Ambulatory Visit: Payer: Self-pay | Admitting: Family Medicine

## 2024-01-10 DIAGNOSIS — Z1330 Encounter for screening examination for mental health and behavioral disorders, unspecified: Secondary | ICD-10-CM | POA: Diagnosis not present

## 2024-01-10 DIAGNOSIS — M545 Low back pain, unspecified: Secondary | ICD-10-CM | POA: Diagnosis not present

## 2024-01-10 DIAGNOSIS — M4316 Spondylolisthesis, lumbar region: Secondary | ICD-10-CM | POA: Diagnosis not present

## 2024-01-10 DIAGNOSIS — Z1331 Encounter for screening for depression: Secondary | ICD-10-CM | POA: Diagnosis not present

## 2024-01-10 DIAGNOSIS — M5116 Intervertebral disc disorders with radiculopathy, lumbar region: Secondary | ICD-10-CM | POA: Diagnosis not present

## 2024-01-10 DIAGNOSIS — G8929 Other chronic pain: Secondary | ICD-10-CM | POA: Diagnosis not present

## 2024-01-10 DIAGNOSIS — M5416 Radiculopathy, lumbar region: Secondary | ICD-10-CM | POA: Diagnosis not present

## 2024-01-12 ENCOUNTER — Encounter: Payer: Self-pay | Admitting: Dermatology

## 2024-01-12 ENCOUNTER — Ambulatory Visit: Admitting: Dermatology

## 2024-01-12 DIAGNOSIS — D485 Neoplasm of uncertain behavior of skin: Secondary | ICD-10-CM | POA: Diagnosis not present

## 2024-01-12 DIAGNOSIS — L821 Other seborrheic keratosis: Secondary | ICD-10-CM

## 2024-01-12 NOTE — Patient Instructions (Addendum)

## 2024-01-12 NOTE — Progress Notes (Signed)
   New Patient Visit   Subjective  Sharon Wright is a 63 y.o. female who presents for the following: growth  Pt has a growth mid chest which has been treated with cryotherapy 3 times in the past. She states that it has been present for several years. She is accompanied by her husband. Lesion is bothersome and itchy.   The following portions of the chart were reviewed this encounter and updated as appropriate: medications, allergies, medical history  Review of Systems:  No other skin or systemic complaints except as noted in HPI or Assessment and Plan.  Objective  Well appearing patient in no apparent distress; mood and affect are within normal limits.  A focused examination was performed of the following areas: Mid chest  Relevant exam findings are noted in the Assessment and Plan.  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 Epidermal / dermal shaving  Lesion diameter (cm):  1.5 Informed consent: discussed and consent obtained   Timeout: patient name, date of birth, surgical site, and procedure verified   Procedure prep:  Patient was prepped and draped in usual sterile fashion Prep type:  Isopropyl alcohol Anesthesia: the lesion was anesthetized in a standard fashion   Anesthetic:  1% lidocaine  w/ epinephrine  1-100,000 buffered w/ 8.4% NaHCO3 Instrument used: DermaBlade   Hemostasis achieved with: aluminum chloride   Outcome: patient tolerated procedure well   Post-procedure details: sterile dressing applied and wound care instructions given   Dressing type: bandage and pressure dressing    Specimen 1 - Surgical pathology Differential Diagnosis: R/O SK vs MM vs other  Check Margins: No  Return for TBSE with brenda.  I, Darice Smock, CMA, am acting as scribe for RUFUS CHRISTELLA HOLY, MD.   Documentation: I have reviewed the above documentation for accuracy and completeness, and I agree with the  above.  RUFUS CHRISTELLA HOLY, MD

## 2024-01-13 LAB — SURGICAL PATHOLOGY

## 2024-01-16 ENCOUNTER — Ambulatory Visit: Payer: Self-pay | Admitting: Dermatology

## 2024-01-30 DIAGNOSIS — M47816 Spondylosis without myelopathy or radiculopathy, lumbar region: Secondary | ICD-10-CM | POA: Diagnosis not present

## 2024-02-04 DIAGNOSIS — R590 Localized enlarged lymph nodes: Secondary | ICD-10-CM | POA: Diagnosis not present

## 2024-02-04 DIAGNOSIS — J0100 Acute maxillary sinusitis, unspecified: Secondary | ICD-10-CM | POA: Diagnosis not present

## 2024-02-04 DIAGNOSIS — R5383 Other fatigue: Secondary | ICD-10-CM | POA: Diagnosis not present

## 2024-02-04 DIAGNOSIS — J029 Acute pharyngitis, unspecified: Secondary | ICD-10-CM | POA: Diagnosis not present

## 2024-05-07 ENCOUNTER — Ambulatory Visit: Admitting: Physician Assistant

## 2024-05-11 ENCOUNTER — Ambulatory Visit: Admitting: Internal Medicine
